# Patient Record
Sex: Female | Born: 1965
Health system: Southern US, Community
[De-identification: ages and names within clinical notes are randomized; demographics above are authoritative.]

## PROBLEM LIST (undated history)

## (undated) DIAGNOSIS — M1990 Unspecified osteoarthritis, unspecified site: Secondary | ICD-10-CM

## (undated) DIAGNOSIS — F32A Depression, unspecified: Secondary | ICD-10-CM

## (undated) DIAGNOSIS — E785 Hyperlipidemia, unspecified: Secondary | ICD-10-CM

## (undated) DIAGNOSIS — D649 Anemia, unspecified: Secondary | ICD-10-CM

## (undated) DIAGNOSIS — I10 Essential (primary) hypertension: Secondary | ICD-10-CM

## (undated) DIAGNOSIS — E119 Type 2 diabetes mellitus without complications: Secondary | ICD-10-CM

## (undated) DIAGNOSIS — F329 Major depressive disorder, single episode, unspecified: Secondary | ICD-10-CM

## (undated) DIAGNOSIS — K219 Gastro-esophageal reflux disease without esophagitis: Secondary | ICD-10-CM

## (undated) HISTORY — PX: TUBAL LIGATION: SHX77

## (undated) HISTORY — DX: Gastro-esophageal reflux disease without esophagitis: K21.9

## (undated) HISTORY — DX: Type 2 diabetes mellitus without complications: E11.9

## (undated) HISTORY — DX: Essential (primary) hypertension: I10

## (undated) HISTORY — DX: Unspecified osteoarthritis, unspecified site: M19.90

## (undated) HISTORY — DX: Depression, unspecified: F32.A

## (undated) HISTORY — DX: Hyperlipidemia, unspecified: E78.5

## (undated) HISTORY — DX: Major depressive disorder, single episode, unspecified: F32.9

## (undated) HISTORY — DX: Anemia, unspecified: D64.9

## (undated) HISTORY — PX: HERNIA REPAIR: SHX51

## (undated) HISTORY — PX: ANKLE SURGERY: SHX546

---

## 2009-10-06 ENCOUNTER — Ambulatory Visit: Payer: Self-pay | Admitting: Family Medicine

## 2009-10-06 ENCOUNTER — Ambulatory Visit (HOSPITAL_COMMUNITY): Admission: RE | Admit: 2009-10-06 | Discharge: 2009-10-06 | Payer: Self-pay | Admitting: Family Medicine

## 2009-10-06 DIAGNOSIS — F329 Major depressive disorder, single episode, unspecified: Secondary | ICD-10-CM

## 2009-10-06 DIAGNOSIS — R5383 Other fatigue: Secondary | ICD-10-CM

## 2009-10-06 DIAGNOSIS — F32A Depression, unspecified: Secondary | ICD-10-CM | POA: Insufficient documentation

## 2009-10-06 DIAGNOSIS — R5381 Other malaise: Secondary | ICD-10-CM | POA: Insufficient documentation

## 2009-10-06 DIAGNOSIS — I10 Essential (primary) hypertension: Secondary | ICD-10-CM | POA: Insufficient documentation

## 2009-10-30 ENCOUNTER — Encounter: Payer: Self-pay | Admitting: Family Medicine

## 2009-10-30 ENCOUNTER — Ambulatory Visit: Payer: Self-pay | Admitting: Family Medicine

## 2009-10-30 LAB — CONVERTED CEMR LAB
ALT: 10 units/L (ref 0–35)
AST: 13 units/L (ref 0–37)
Albumin: 3.7 g/dL (ref 3.5–5.2)
Alkaline Phosphatase: 42 units/L (ref 39–117)
BUN: 14 mg/dL (ref 6–23)
CO2: 25 meq/L (ref 19–32)
Calcium: 9 mg/dL (ref 8.4–10.5)
Chloride: 101 meq/L (ref 96–112)
Cholesterol: 205 mg/dL — ABNORMAL HIGH (ref 0–200)
Creatinine, Ser: 0.83 mg/dL (ref 0.40–1.20)
Glucose, Bld: 240 mg/dL — ABNORMAL HIGH (ref 70–99)
HCT: 35.2 % — ABNORMAL LOW (ref 36.0–46.0)
HDL: 74 mg/dL (ref >39)
Hemoglobin: 10.9 g/dL — ABNORMAL LOW (ref 12.0–15.0)
LDL Cholesterol: 110 mg/dL — ABNORMAL HIGH (ref 0–99)
MCHC: 31 g/dL (ref 30.0–36.0)
MCV: 77.9 fL — ABNORMAL LOW (ref 78.0–100.0)
Platelets: 239 10*3/uL (ref 150–400)
Potassium: 3.8 meq/L (ref 3.5–5.3)
RBC: 4.52 M/uL (ref 3.87–5.11)
RDW: 15.2 % (ref 11.5–15.5)
Sodium: 141 meq/L (ref 135–145)
TSH: 2.684 microintl units/mL (ref 0.350–4.500)
Total Bilirubin: 0.4 mg/dL (ref 0.3–1.2)
Total CHOL/HDL Ratio: 2.8
Total Protein: 7.2 g/dL (ref 6.0–8.3)
Triglycerides: 103 mg/dL (ref <150)
VLDL: 21 mg/dL (ref 0–40)
WBC: 6.8 10*3/uL (ref 4.0–10.5)

## 2009-11-02 ENCOUNTER — Telehealth: Payer: Self-pay | Admitting: Family Medicine

## 2009-11-10 ENCOUNTER — Ambulatory Visit: Payer: Self-pay | Admitting: Family Medicine

## 2009-11-10 DIAGNOSIS — M25569 Pain in unspecified knee: Secondary | ICD-10-CM

## 2009-11-10 DIAGNOSIS — E119 Type 2 diabetes mellitus without complications: Secondary | ICD-10-CM | POA: Insufficient documentation

## 2009-11-10 DIAGNOSIS — G8929 Other chronic pain: Secondary | ICD-10-CM | POA: Insufficient documentation

## 2009-11-10 DIAGNOSIS — D649 Anemia, unspecified: Secondary | ICD-10-CM | POA: Insufficient documentation

## 2009-11-10 LAB — CONVERTED CEMR LAB
Albumin/Creatinine Ratio, Urine, POC: <30
Creatinine,U: 100 mg/dL
Hgb A1c MFr Bld: 9.7 %
Iron: 98 ug/dL (ref 42–145)
Microalbumin U total vol: 10 mg/L
Retic Ct Pct: 0.9 % (ref 0.4–3.1)
Saturation Ratios: 23 % (ref 20–55)
TIBC: 422 ug/dL (ref 250–470)
UIBC: 324 ug/dL

## 2009-11-15 ENCOUNTER — Encounter: Payer: Self-pay | Admitting: Family Medicine

## 2009-12-08 ENCOUNTER — Ambulatory Visit: Payer: Self-pay | Admitting: Family Medicine

## 2009-12-08 LAB — CONVERTED CEMR LAB
Chlamydia, DNA Probe: NEGATIVE
GC Probe Amp, Genital: NEGATIVE
Pap Smear: NEGATIVE

## 2009-12-11 ENCOUNTER — Encounter: Payer: Self-pay | Admitting: Family Medicine

## 2009-12-13 ENCOUNTER — Encounter: Payer: Self-pay | Admitting: Family Medicine

## 2010-01-05 ENCOUNTER — Encounter: Payer: Self-pay | Admitting: Family Medicine

## 2010-01-05 ENCOUNTER — Ambulatory Visit: Payer: Self-pay | Admitting: Family Medicine

## 2010-01-05 DIAGNOSIS — N926 Irregular menstruation, unspecified: Secondary | ICD-10-CM | POA: Insufficient documentation

## 2010-01-05 DIAGNOSIS — K027 Dental root caries: Secondary | ICD-10-CM | POA: Insufficient documentation

## 2010-01-05 LAB — CONVERTED CEMR LAB
HCT: 36.3 % (ref 36.0–46.0)
Hemoglobin: 11 g/dL — ABNORMAL LOW (ref 12.0–15.0)
Hgb A2 Quant: 2.2 % (ref 2.2–3.2)
Hgb A: 97.8 % (ref 96.8–97.8)
Hgb F Quant: 0 % (ref 0.0–2.0)
Hgb S Quant: 0 % (ref 0.0–0.0)
INR: 0.98 (ref <1.50)
MCHC: 30.3 g/dL (ref 30.0–36.0)
MCV: 78.9 fL (ref 78.0–100.0)
Platelets: 212 10*3/uL (ref 150–400)
Prothrombin Time: 13.2 s (ref 11.6–15.2)
RBC: 4.6 M/uL (ref 3.87–5.11)
RDW: 15.4 % (ref 11.5–15.5)
WBC: 7 10*3/uL (ref 4.0–10.5)
aPTT: 28 s (ref 24–37)

## 2010-01-08 ENCOUNTER — Ambulatory Visit (HOSPITAL_COMMUNITY)
Admission: RE | Admit: 2010-01-08 | Discharge: 2010-01-08 | Payer: Self-pay | Source: Home / Self Care | Admitting: Family Medicine

## 2010-01-08 ENCOUNTER — Telehealth: Payer: Self-pay | Admitting: Family Medicine

## 2010-01-08 IMAGING — US US TRANSVAGINAL NON-OB
1 series · 14 of 25 positions shown · non-contrast
Comparison: None

CLINICAL DATA: Irregular bleeding.  Mild anemia.



[Series 1: us transvaginal non-ob · 0.28mm/px · 14 of 49 slices shown]
[im 1/49]
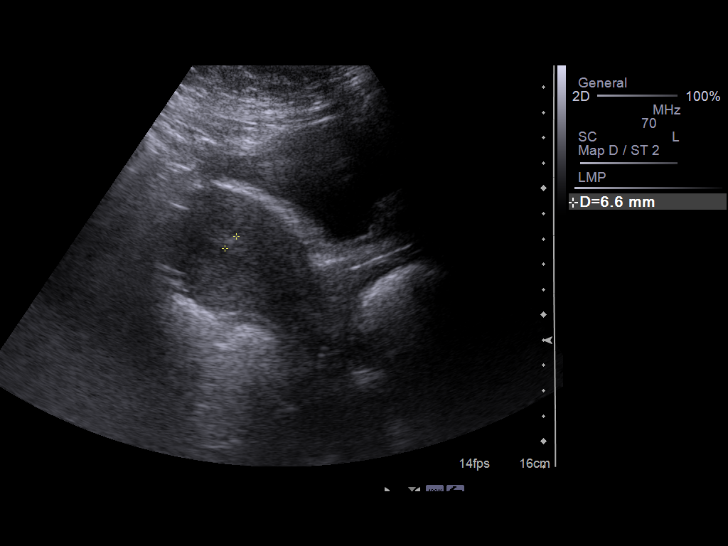
[im 5/49]
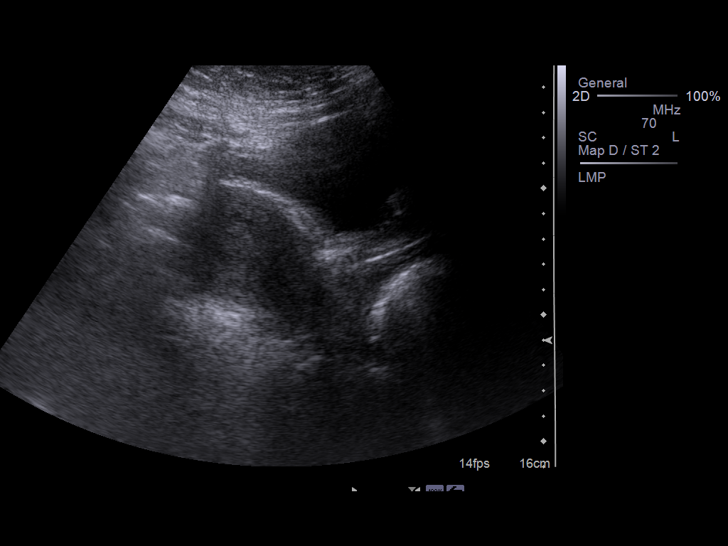
[im 9/49]
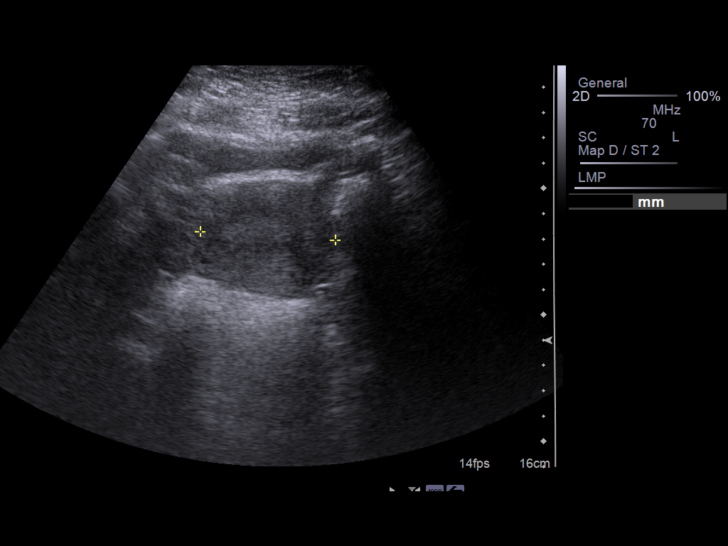
[im 13/49]
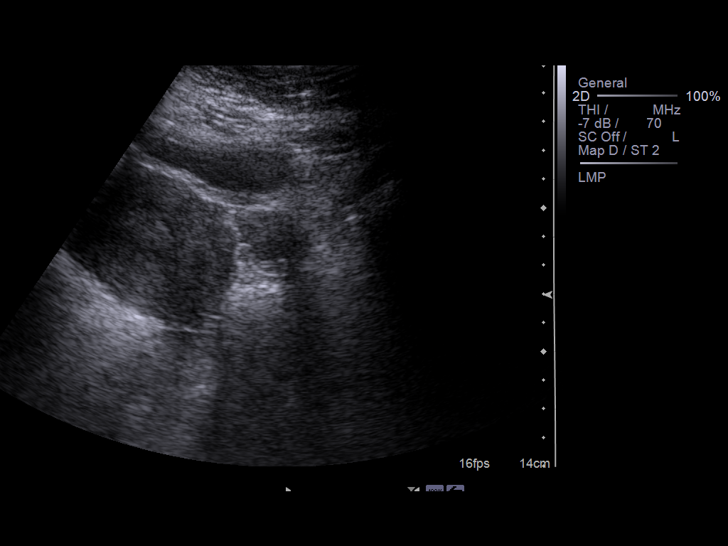
[im 17/49]
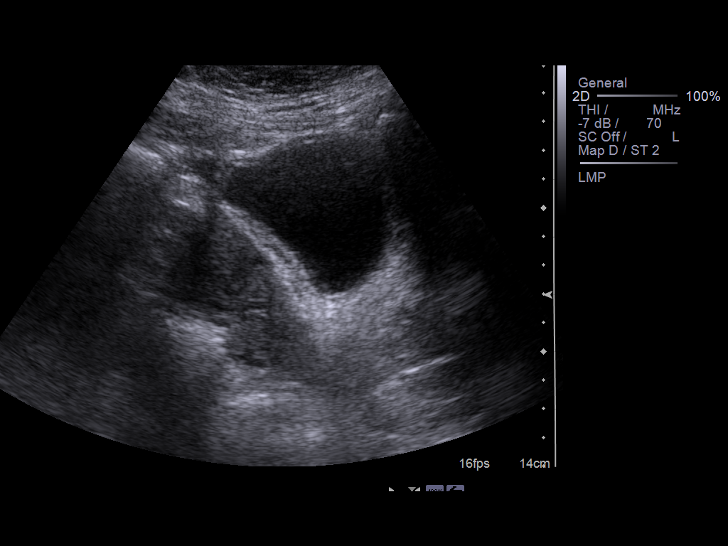
[im 19/49]
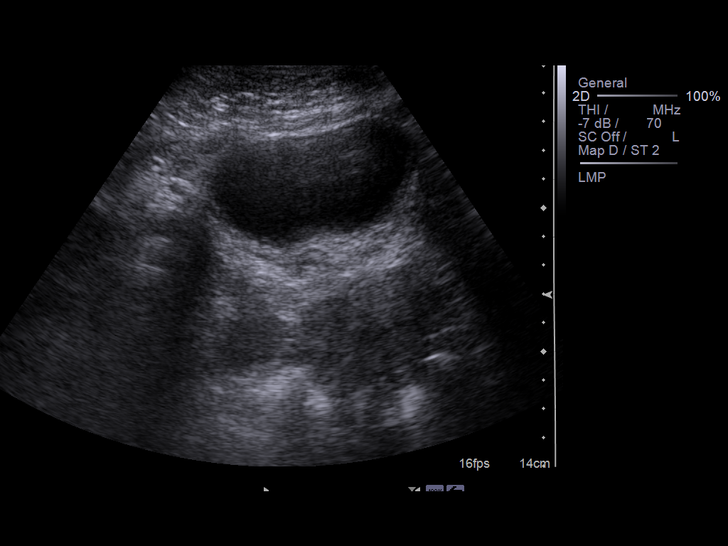
[im 23/49]
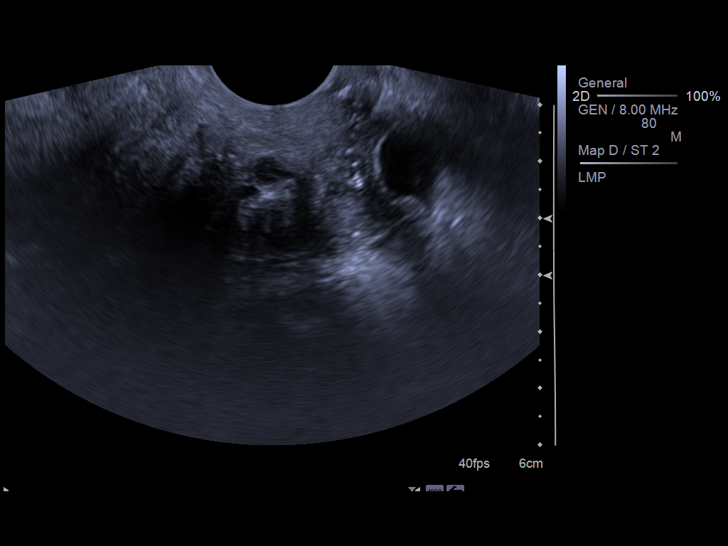
[im 27/49]
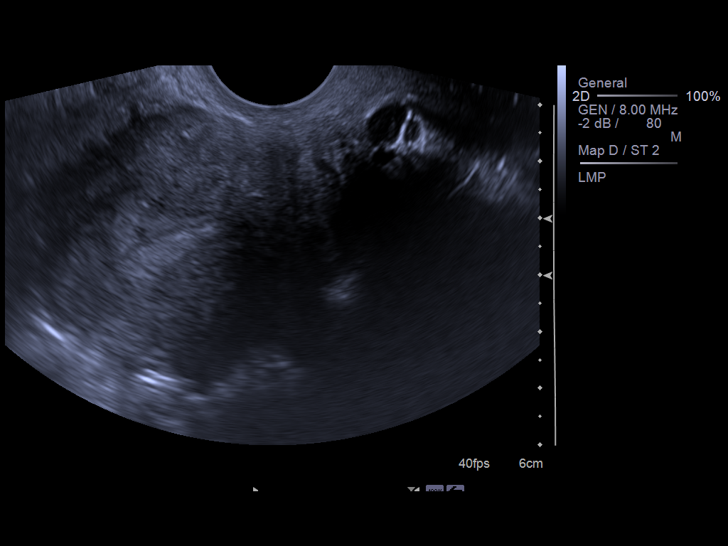
[im 31/49]
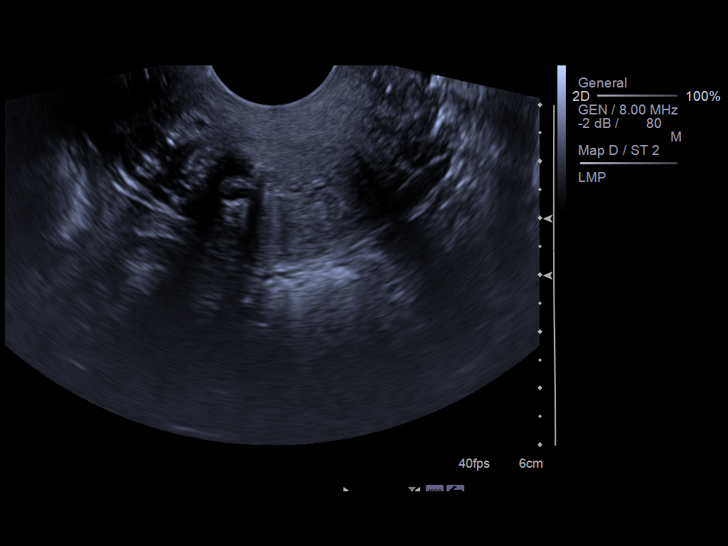
[im 33/49]
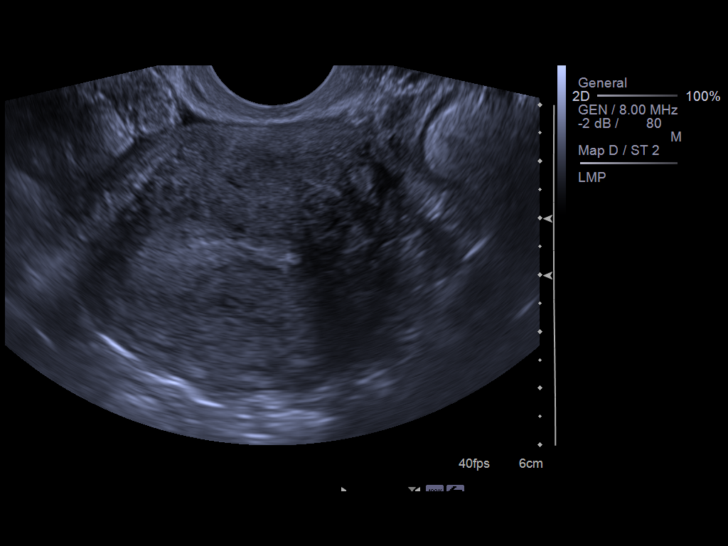
[im 37/49]
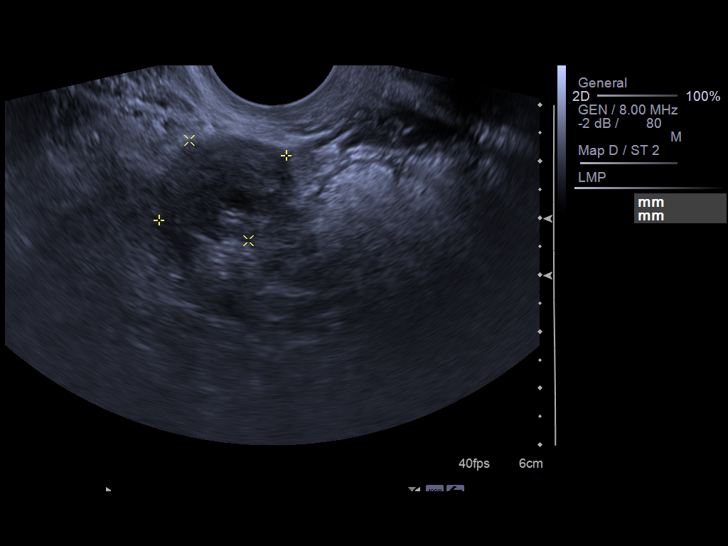
[im 41/49]
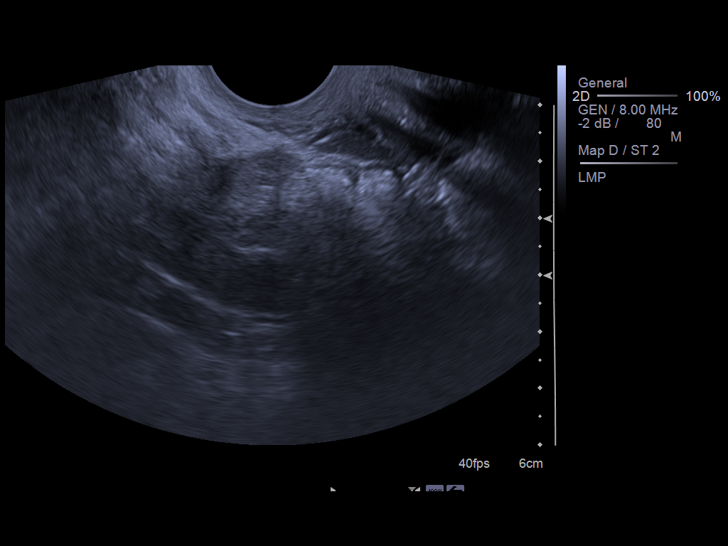
[im 45/49]
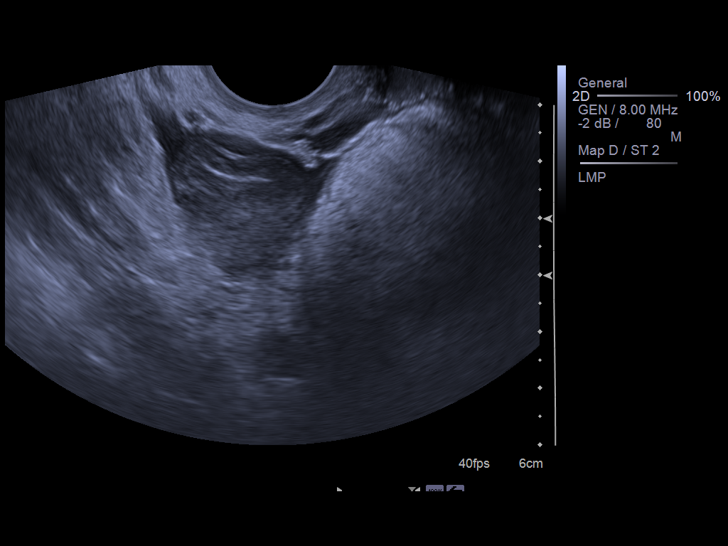
[im 49/49]
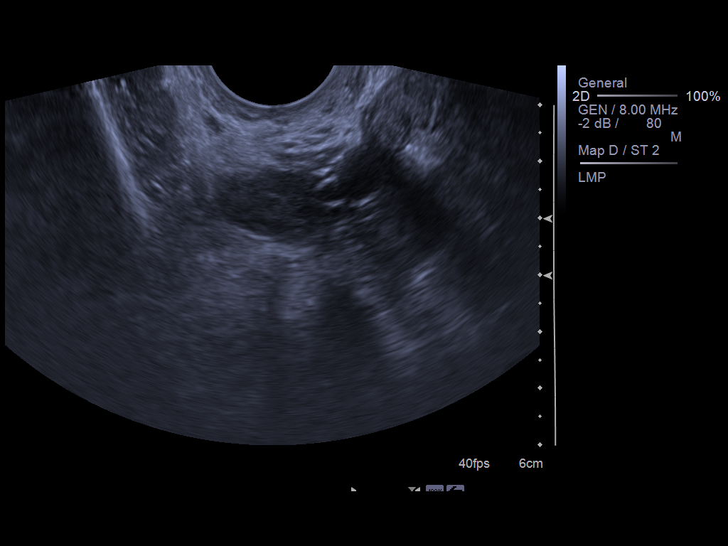

[14 of 25 positions shown; findings below may reference images not displayed]

FINDINGS: Uterus the uterus is 9.5 x 4.5 x 5.4 cm.  No mass identified.

Endometrium the endometrium is 1.3 cm and homogeneous.

Right Ovary the right ovary is 2.6 x 2.2 x 2.7 cm and has a normal
appearance.

Left Ovary the left ovary is 2.5 x 2.1 x 2.7 cm and has a normal
appearance.

Other Findings:  No free pelvic fluid.
IMPRESSION: Normal pelvic ultrasound.

## 2010-01-11 ENCOUNTER — Encounter: Payer: Self-pay | Admitting: Family Medicine

## 2010-01-19 ENCOUNTER — Ambulatory Visit: Payer: Self-pay | Admitting: Family Medicine

## 2010-01-19 LAB — CONVERTED CEMR LAB: Beta hcg, urine, semiquantitative: NEGATIVE

## 2010-01-22 ENCOUNTER — Telehealth: Payer: Self-pay | Admitting: Family Medicine

## 2010-02-25 ENCOUNTER — Encounter: Payer: Self-pay | Admitting: Family Medicine

## 2010-03-06 NOTE — Assessment & Plan Note (Signed)
Summary: discuss bleeding/eo   Vital Signs:  Patient profile:   45 year old female Height:      64 inches Weight:      209.31 pounds BMI:     36.06 BSA:     2.00 Temp:     98.5 degrees F Pulse rate:   100 / minute BP sitting:   124 / 90  Vitals Entered By: Jone Baseman CMA (January 05, 2010 10:31 AM) CC: bleeding after intercourse Is Patient Diabetic? No Pain Assessment Patient in pain? no        CC:  bleeding after intercourse.  History of Present Illness: Seen today to discuss two items:   1) has had concerns about dyspareunia and bleeding with intercourse.  has to use a pad on the bed to prevent bleeding on sheets.  Has had some variation in the frequency of her menstrual cycle as well. Usually around 28 days.  LMP 11/13; prior to that was 33 days to her penultimate menses.  Last 3 to 4 days, usually heaviest first day then lighten up.    Not on hormones (had BTL-- would like to have it reversed, discussed that we consider BTL to be irreversible).   2) Dental problems; not particularly painful, but has had breakage of her molars.  L lower and R upper molars.  requests dental referral.   Habits & Providers  Alcohol-Tobacco-Diet     Tobacco Status: quit  Current Medications (verified): 1)  Dyazide 37.5-25 Mg Caps (Triamterene-Hctz) .Marland Kitchen.. 1 By Mouth Daily 2)  Lisinopril 10 Mg Tabs (Lisinopril) .Marland Kitchen.. 1 By Mouth Once Daily  Allergies (verified): No Known Drug Allergies  Physical Exam  General:  well appearing, no apparent distress   Impression & Recommendations:  Problem # 1:  IRREGULAR MENSTRUAL CYCLE (ICD-626.4) Discussed results of her recent PAP (normal), history of LEEP procedure.  Discussed EMB as next step in workup of irreg bleeding.  Also to suggest transvaginal US.  To schedule for EMB here; she is to take ibuprofen 600-800mg  before coming to office. Pregnancy test upon arrival for next visit (ordered as future order). Orders: PTT-[facility]  ([id number]) INR/PT-[facility] (09811) Ultrasound (Ultrasound) [facility]- Est  Level 4 (99214)Future Orders: U Preg-[facility] (91478) ... 01/24/2011  Problem # 2:  DENTAL CARIES OF ROOT SURFACE (ICD-521.08) Order done for dentist.  Orders: Dental Referral (Dentist) [facility]- Est  Level 4 (29562)  Problem # 3:  ANEMIA, IRON DEFICIENCY, MICROCYTIC (ICD-280.8) Anemia, for consideration of inherited hemoglobinopathy.  For Hgb EF.  Orders: CBC-[facility] ([address]86) Miscellaneous Lab Charge-[facility] [id number]) PTT-[facility] ([id number]) INR/PT-[facility] (24401) [facility]- Est  Level 4 (02725)  Complete Medication List: 1)  Dyazide 37.5-25 Mg Caps (Triamterene-hctz) .Marland Kitchen.. 1 by mouth daily 2)  Lisinopril 10 Mg Tabs (Lisinopril) .Marland Kitchen.. 1 by mouth once daily  Patient Instructions: 1)  It was a pleasure to see you today. I am ordering blood tests to look for inherited forms of anemia today.  2)  I would like you to come in for an endometrial biopsy at your convenience, later this month.  I would ask you to take over-the-counter ibuprofen, 4 tablets, just before coming to the office to minimize cramping after the procedure. 3)  I am ordering an ultrasound of your pelvis to check for other causes of the irregular bleeding   Orders Added: 1)  CBC-[facility] [85027] 2)  Miscellaneous Lab Charge-[facility] [99999] 3)  U Preg-[facility] [81025] 4)  PTT-[facility] [[id number]] 5)  INR/PT-[facility] [85610] 6)  Ultrasound [Ultrasound] 7)  Dental Referral [  Dentist] 8)  [facility]- Est  Level 4 [09811]

## 2010-03-06 NOTE — Assessment & Plan Note (Signed)
Summary: np,df   History of Present Illness: New patient to [facility]; comes in to establish care and discuss concern re. dry cough that she has had off and on for about 1 yr.  Attributes it to amlodipine which was started for her BP control by her prior PCP, Dr Malena Peer in [facility].  She is transferring care to [facility], closer to her home.  Had been on HCTZ/ triam without adequate control; had amlodipine 5mg  added, with good control of BP but ensuing cough.  Has not been on other antihypertensives that she knows of. NKDA.  Among other historical points discussed, she reports long history of depression.  Passive death wish but no active plans or intent to harm herself. Thinks of her sons as motivation not to harm herself.  Reports trouble withsleep.  Often will initiate sleep at 3am, and sleep only until 530am.  Feels tired all day.  No burst of energy or nervousness.  Could easily fall asleep in car where she was passenger.  Low energy.  Avoids contact with people: "People annoy me."    Current Medications (verified): 1)  None  Allergies (verified): No Known Drug Allergies  Past History:  Past Medical History: HTN; prior hx anemia (resolved per patient); believes she has depression.  Past Surgical History: s/p BTL Tubal ligation  Family History: Mother living with stroke, heart disease (2007), depression.  Father with adult-onset diabetes.  Suspects family members (cousins) of having depression, although no one diagnosed with this that she is aware of. No known diagnosis of bipolar disease.   Social History: Quit smoking (1995); never used recreational drugs or alcohol.  Divorced. Lives with three sons.  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   Physical Exam  General:  well appearing, no apparent distress.  Eyes:  clear sclerae.  PERRL Mouth:  moist mucus membranes.  Neck:  neck supple. no cervical adenopathy Lungs:  Normal respiratory effort, chest expands  symmetrically. Lungs are clear to auscultation, no crackles or wheezes. Heart:  Normal rate and regular rhythm. S1 and S2 normal without gallop, murmur, click, rub or other extra sounds. Abdomen:  soft, nontender nondistended.  Pulses:  palpable dp pulses bilaterally Extremities:  1+ symmetric ankle edema without skin changes or erythema.    Impression & Recommendations:  Problem # 1:  ESSENTIAL HYPERTENSION, BENIGN (ICD-401.1)  Patient reports cough that she associates with her amlodipine.  Will hold norvasc, change to metoprolol 25mg  twice daily. ECG done today. Fasting labs to include chol panel, renal fxn, CBC and glucose. Followup in 50month with me.   Her updated medication list for this problem includes:    Dyazide 37.5-25 Mg Caps (Triamterene-hctz) .Marland Kitchen... 1 by mouth daily    Metoprolol Tartrate 25 Mg Tabs (Metoprolol tartrate) .Marland Kitchen... 1 by mouth two times a day  Problem # 2:  DEPRESSIVE DISORDER NOT ELSEWHERE CLASSIFIED (ICD-311) PHQ9 score is 21.  Question #9 is a 3, is quick to say she would not hurt herself because of her sons' reaction.  Never treated for depression before. Sleeps very little, falls asleep in car when a passenger.  I suspect sleep disorder not related to bipolar d/o.  Will evaluate further at next visit, including Epworth Sleepiness Scale.   Complete Medication List: 1)  Dyazide 37.5-25 Mg Caps (Triamterene-hctz) .Marland Kitchen.. 1 by mouth daily 2)  Metoprolol Tartrate 25 Mg Tabs (Metoprolol tartrate) .Marland Kitchen.. 1 by mouth two times a day  Other Orders: EKG- [facility] (EKG) Future Orders: Lipid-[facility] ([id number]) .Marland KitchenMarland Kitchen  10/11/2010 Comp Met-[facility] ([id number]) ... 10/12/2010 CBC-[facility] (09811) ... 10/19/2010 TSH-[facility] [phone]) ... 10/19/2010  Patient Instructions: 1)  It was a pleasure to meet you today.  2)  I am changing your  blood pressure meds.  Stop taking the amlopidine.  I am prescribing a new medicine, metoprolol 25mg  take 1 tablet twice daily.  3)  Please come in fasting (8  hours without anything but water to eat or drink) for labs (cholesterol, kidney function, blood count and sugar).  Also, blood pressure check then as well.  4)  I would like to see you in 1 month. (Followup 1 month with Dr Mauricio Po) Prescriptions: METOPROLOL TARTRATE 25 MG TABS (METOPROLOL TARTRATE) 1 by mouth two times a day  #60 x 6   Entered and Authorized by:   Paula Compton MD   Signed by:   Paula Compton MD on 10/06/2009   Method used:   Electronically to        [facility] [phone]* (retail)       [address]       [place], [place]  [address]       Ph: [phone]       Fax: [phone]   RxID:   [id number] DYAZIDE 37.5-25 MG CAPS (TRIAMTERENE-HCTZ) 1 by mouth daily  #30 x 6   Entered and Authorized by:   Paula Compton MD   Signed by:   Paula Compton MD on 10/06/2009   Method used:   Electronically to        [facility] [phone]* (retail)       [address]       [place], [place]  [address]       Ph: [phone]       Fax: [phone]   RxID:   [id number]

## 2010-03-06 NOTE — Progress Notes (Signed)
  Phone Note Outgoing Call   Call placed by: Paula Compton MD,  November 02, 2009 8:49 AM Call placed to: Patient Summary of Call: Spoke with patient at [phone] to report lab values, discuss elevated glucose (240, fasting).  She reports that she has never had elevated glucose that she knows of; never diagnosed with DM.  Also with slight microcytic anemia, will discuss possible causes (menses, GI blood loss, hemoglobinopathies, family history) at visit scheduled for Fri Oct 7th.  Will plan on further workup with Fe, TIBC, ferritin, peripheral smear, and screening for occult colon cancer.  She plans to attend next visit. Initial call taken by: Paula Compton MD,  November 02, 2009 8:55 AM

## 2010-03-06 NOTE — Letter (Signed)
Summary: Generic Letter  [facility]  [address]   [place], [place] [address]   Phone: [phone]  Fax: [phone]    11/15/2009  EVAROSE ALTLAND [address] [place], [place]  [address]  Dear Ms. Juran,   It was a pleasure to see you last week in the office.  I got the results of your bloodwork back, and your iron levels are okay.  We can talk about the way we can assess the mild anemia when you come in for your Pap smear.      Sincerely,   Paula Compton MD  Appended Document: Generic Letter mailed.

## 2010-03-06 NOTE — Letter (Signed)
Summary: Generic Letter  [facility]  [address]   [place], [place] [address]   Phone: [phone]  Fax: [phone]    12/13/2009  Colleen Ford [address] [place], [place]  [address]  Dear [name],   I write with good news regarding your Pap smear.  The result came back negative (normal).         Sincerely,   Paula Compton MD  Appended Document: Generic Letter mailed

## 2010-03-06 NOTE — Letter (Signed)
Summary: Generic Letter  [facility]  [address]   [place], [place] [address]   Phone: [phone]  Fax: [phone]    12/11/2009  Colleen Ford [address] [place], [place]  [address]  Dear [name],   I hope this letter finds you well.  I write to let you know that the cervical cultures done during your most recent visit are negative.  I will be in contact once I get the results of the Pap smear.         Sincerely,   Paula Compton MD

## 2010-03-06 NOTE — Assessment & Plan Note (Signed)
Summary: F/U/KH   Vital Signs:  Patient profile:   45 year old female Height:      64 inches Weight:      207 pounds BMI:     35.66 Temp:     98.1 degrees F oral Pulse rate:   67 / minute BP sitting:   110 / 69  (left arm) Cuff size:   regular  Vitals Entered By: Tessie Fass CMA (November 10, 2009 1:35 PM) CC: F/U labs Is Patient Diabetic? No Pain Assessment Patient in pain? no        CC:  F/U labs.  Habits & Providers  Alcohol-Tobacco-Diet     Tobacco Status: quit  Allergies: No Known Drug Allergies  Social History: Smoking Status:  quit   Impression & Recommendations:  Problem # 1:  DIABETES MELLITUS (ICD-250.00) Newly diagnosed, with fasting glucose 240.  Some dry mouth on exam today, but denies polyuria or polydipsia.  For A1c, discussed minor changes in diet and exercise.  She has pica for [place] starch (corn starch), mostly during menses.  Will address the likely Fe deficiency, which in turn may lessen her craving for corn starch and improve her DM control.  Her updated medication list for this problem includes:    Lisinopril 10 Mg Tabs (Lisinopril) .Marland Kitchen... 1 by mouth once daily  Orders: A1C-[facility] (04540) UA Microalbumin-[facility] (82044) [facility]- Est  Level 4 (98119)  Problem # 2:  ANEMIA, IRON DEFICIENCY, MICROCYTIC (ICD-280.8) Microcytic anemia; has three-day cycles.  Some intermittent vaginal bleeding. Will need PAP smear in the coming month, and eval for bleeding with intercourse (painless). Pica with menses.  Likely to need Fe supplementation.  Orders: Iron -[facility] [id number]) Iron Binding Cap (TIBC)-[facility] ([id number]) Retic-[facility] ([id number]) [facility]- Est  Level 4 (24401)  Problem # 3:  KNEE PAIN, RIGHT (ICD-719.46) Medial joint space pain in R knee, no effusion noted.  Will send for standing xrays.  Acetaminophen analgesia. Orders: Diagnostic X-Ray/Fluoroscopy (Diagnostic X-Ray/Flu) [facility]- Est  Level 4 (02725)  Problem # 4:  ESSENTIAL HYPERTENSION, BENIGN  (ICD-401.1) Pressure well controlled on metoprolol.  Given new dx of DM, and her concern re. finances, will have her finish current supply of metoprolol and start lisinopril (stop metoprolol) in 3 weeks when metoprolol runs out. Continue dyazide along with lisinopril. Recheck BP at next visit.   Problem # 5:  DEPRESSIVE DISORDER NOT ELSEWHERE CLASSIFIED (ICD-311)  Continues to feel about the same as last month. No changes (is clear that this is no worse than before).  Orders: [facility]- Est  Level 4 ([address]644)  Complete Medication List: 1)  Dyazide 37.5-25 Mg Caps (Triamterene-hctz) .Marland Kitchen.. 1 by mouth daily 2)  Lisinopril 10 Mg Tabs (Lisinopril) .Marland Kitchen.. 1 by mouth once daily  Patient Instructions: 1)  It was a pleasure to see you today.  2)  I am sending you for an xray of your knees (standing). 3)  Keep taking your metoprolol twice daily UNTIL IT RUNS OUT.  When it runs out, SWITCH TO THE LISINOPRIL 10mg  ONE TIME DAILY FOR BLOOD PRESSURE AND STOP THE METOPROLOL. 4)  Please make an appointment to do your PAP smear and to examine for causes of the bleeding.  Keep a log of your bleeding.  5)  I am ordering labs today for iron deficiency anemia, as well as to assess the diabetes.  I will be in touch with results.  Prescriptions: DYAZIDE 37.5-25 MG CAPS (TRIAMTERENE-HCTZ) 1 by mouth daily  #30 x 6   Entered and  Authorized by:   Paula Compton MD   Signed by:   Paula Compton MD on 11/10/2009   Method used:   Electronically to        [facility] [phone]* (retail)       [address]       [place], [place]  [address]       Ph: [phone]       Fax: [phone]   RxID:   [id number] LISINOPRIL 10 MG TABS (LISINOPRIL) 1 by mouth once daily  #30 x 6   Entered and Authorized by:   Paula Compton MD   Signed by:   Paula Compton MD on 11/10/2009   Method used:   Electronically to        [facility] [phone]* (retail)       [address]       [place], [place]  [address]       Ph: [phone]        Fax: [phone]   RxID:   [id number]   Laboratory Results   Urine Tests  Date/Time Received: November 10, 2009 2:17 PM  Date/Time Reported: November 10, 2009 3:20 PM   Microalbumin (urine): 10 mg/L Creatinine: 100mg /dL  A:C Ratio <02 Normal Comments: ...............test performed by......Marland KitchenBonnie A. [place], MLS (ASCP)cm   Blood Tests   Date/Time Received: November 10, 2009 2:17 PM  Date/Time Reported: November 10, 2009 3:20 PM   HGBA1C: 9.7%   (Normal Range: Non-Diabetic - 3-6%   Control Diabetic - 6-8%)  Comments: ...............test performed by......Marland KitchenBonnie A. [place], MLS (ASCP)cm

## 2010-03-06 NOTE — Progress Notes (Signed)
  Phone Note Outgoing Call Call back at [facility] Phone [phone]   Call placed by: Paula Compton MD,  January 08, 2010 1:43 PM Call placed to: Patient Summary of Call: Called patient to report [place] results, which were normal.  Left voice message.  Initial call taken by: Paula Compton MD,  January 08, 2010 1:44 PM

## 2010-03-06 NOTE — Letter (Signed)
Summary: Generic Letter  [facility]  [address]   [place], [place] [address]   Phone: [phone]  Fax: [phone]    01/11/2010  AERILYNN GOIN [address] [place], [place]  [address]  Dear Ms. Signer,    I write with the results of your recent lab tests.  The mild anemia is unchanged; the test for inherited forms of anemia is negative.   I look forward to discussing this with you at your next visit.       Sincerely,   Paula Compton MD  Appended Document: Generic Letter mailed

## 2010-03-06 NOTE — Assessment & Plan Note (Signed)
Summary: CPE/KH (resch'd fr 11/2)bmc   Vital Signs:  Patient profile:   45 year old female Height:      64 inches Weight:      208 pounds BMI:     35.83 Temp:     98.0 degrees F oral Pulse rate:   99 / minute BP sitting:   137 / 85  (left arm) Cuff size:   large  Vitals Entered By: Tessie Fass CMA (December 08, 2009 2:58 PM) CC: CPP Is Patient Diabetic? No Pain Assessment Patient in pain? no        CC:  CPP.  History of Present Illness: Colleen Ford is here today for well woman exam.  Last WWE was with Dr. Okey Dupre, had abnormal PAP and then a colpo with LEEP procedure done Feb/March 2010.  Also diagnosed with STI (GC and Chlamydia), treated in March 2011.  Says she had HIV and RPR testing at that time, negative. Is not interested in this testing (HIV/RPR) at this time.   LMP Oct 8-11, 2011. Starts light, has heavy flow on last day. Penultimate menses on Sept 4-11, again, not very heavy.  Had BTL 12 yrs ago. Is interested in reversal, is sexually active and interested in having children.  Denies dysuria, vaginal discharge or bleeding between her menses. Says that after intercourse, a small amt of bleeding (emphasizes it is related to intercourse).  No spermicides or lubricants. Uses condoms.   States that her depression/mood is unchanged from last visit.   In discussing her anemia, (microcytic, with RDW that is not wide), no family members with inherited anemias. Has a cousin with sickle cell trait.  Deryn herself has never been tested.   Habits & Providers  Alcohol-Tobacco-Diet     Tobacco Status: quit  Current Medications (verified): 1)  Dyazide 37.5-25 Mg Caps (Triamterene-Hctz) .Marland Kitchen.. 1 By Mouth Daily 2)  Lisinopril 10 Mg Tabs (Lisinopril) .Marland Kitchen.. 1 By Mouth Once Daily  Allergies (verified): No Known Drug Allergies  Physical Exam  General:  well appearing, no apparent distress.  Abdomen:  soft, nontender.  Rectal:  intact Genitalia:  normal vaginal mucosa.  No  evidence of trauma or friability.  Cervix clean and smooth, without lesions.  After performing PAP and endocervical brush, blood with clots from os. No masses in adnexae with bimanual exam.  Denies pain.    Impression & Recommendations:  Problem # 1:  SCREENING FOR MALIGNANT NEOPLASM OF THE CERVIX (ICD-V76.2) PAP smear done today.  History of LEEP March 2010.  Will attempt to get records.   Orders: Pap Smear-[facility] ([id number]) [facility]- Est  Level 4 (09811)  Problem # 2:  SEXUALLY TRANSMITTED DISEASE, EXPOSURE TO (ICD-V01.6) Prior STI earlier this year.  For culture today.  Asymptomatic.  Orders: GC/Chlamydia-[facility] (87591/87491) [facility]- Est  Level 4 (91478)  Problem # 3:  ANEMIA, IRON DEFICIENCY, MICROCYTIC (ICD-280.8)  MIcrocytic anemia, iron studies do not suggest iron deficiency.  Will not draw blood today, but would like to perform Hgb EF to look for hemoglobinopathy.  Orders: [facility]- Est  Level 4 ([address])  Problem # 4:  DEPRESSIVE DISORDER NOT ELSEWHERE CLASSIFIED (ICD-311)  Stable. No changes.   Orders: [facility]- Est  Level 4 ([address])  Problem # 5:  Screening Breast Cancer (ICD-V76.10) Will review history of breast cancer screening and order mammogram as indicated.   Problem # 6:  ESSENTIAL HYPERTENSION, BENIGN (ICD-401.1) Stable, at goal.  Continue meds as she is taking them. Her updated medication list for this problem includes:  Dyazide 37.5-25 Mg Caps (Triamterene-hctz) .Marland Kitchen... 1 by mouth daily    Lisinopril 10 Mg Tabs (Lisinopril) .Marland Kitchen... 1 by mouth once daily  Orders: [facility]- Est  Level 4 ([address])  Complete Medication List: 1)  Dyazide 37.5-25 Mg Caps (Triamterene-hctz) .Marland Kitchen.. 1 by mouth daily 2)  Lisinopril 10 Mg Tabs (Lisinopril) .Marland Kitchen.. 1 by mouth once daily  Other Orders: Mammogram (Screening) (Mammo)  Patient Instructions: 1)  It was a pleasure to see you today.  Your cervix looks well today, but I will call you with the results of your PAP smear and cervical culture.  2)  I  recommend that we do some more blood work for your anemia ("hemoglobin electrophoresis") to look for inherited anemias, once you have the [employer]).   Orders Added: 1)  Pap Smear-[facility] [[id number]] 2)  GC/Chlamydia-[facility] [87591/87491] 3)  [facility]- Est  Level 4 [91478] 4)  Mammogram (Screening) [Mammo]     Prevention & Chronic Care Immunizations   Influenza vaccine: Not documented    Tetanus booster: Not documented    Pneumococcal vaccine: Not documented  Other Screening   Pap smear: Not documented    Mammogram: Not documented   Mammogram action/deferral: Ordered  (12/08/2009)   Smoking status: quit  (12/08/2009)  Diabetes Mellitus   HgbA1C: 9.7  (11/10/2009)    Eye exam: Not documented    Foot exam: Not documented   High risk foot: Not documented   Foot care education: Not documented    Urine microalbumin/creatinine ratio: Not documented  Lipids   Total Cholesterol: 205  (10/30/2009)   LDL: 110  (10/30/2009)   LDL Direct: Not documented   HDL: 74  (10/30/2009)   Triglycerides: 103  (10/30/2009)  Hypertension   Last Blood Pressure: 137 / 85  (12/08/2009)   Serum creatinine: 0.83  (10/30/2009)   Serum potassium 3.8  (10/30/2009)  Self-Management Support :    Diabetes self-management support: Not documented    Hypertension self-management support: Not documented   Nursing Instructions: Schedule screening mammogram (see order)

## 2010-03-08 NOTE — Progress Notes (Signed)
  Phone Note Outgoing Call Call back at [facility] Phone [phone]   Call placed by: Paula Compton MD,  January 22, 2010 2:16 PM Call placed to: Patient    Called patient back, spoke with her about the EMB path report which does not show evidence of endometrial cancer.  She will log her vaginal bleeding through her Feb cycle and make followup appt at that time.  Paula Compton MD  January 22, 2010 2:24 PM

## 2010-03-08 NOTE — Miscellaneous (Signed)
Summary: Procedures Consent  Procedures Consent   Imported By: De Nurse 01/30/2010 12:06:56  _____________________________________________________________________  External Attachment:    Type:   Image     Comment:   External Document

## 2010-03-08 NOTE — Assessment & Plan Note (Signed)
Summary: F/U  KH   Vital Signs:  Patient profile:   45 year old female Weight:      202 pounds Pulse rate:   80 / minute BP sitting:   122 / 82  (right arm)  Vitals Entered By: Arlyss Repress CMA, (January 19, 2010 2:45 PM) CC: f/up per dr.breen Is Patient Diabetic? No Pain Assessment Patient in pain? no        CC:  f/up per dr.breen.  History of Present Illness: Colleen Ford returns today to have EMB as part of workup for irregular vaginal bleeding.  She reports that her last menstrual cycle took place from Dec 9 to 89.  She continues to have some bleeding at irregular times, and also with intercourse.   Her past surgical history includes BTL.   We discussed the results of her recent transvaginal US, which did not show fibroids.    No family history of cervical or endometrial cancer.   Current Medications (verified): 1)  Dyazide 37.5-25 Mg Caps (Triamterene-Hctz) .Marland Kitchen.. 1 By Mouth Daily 2)  Lisinopril 10 Mg Tabs (Lisinopril) .Marland Kitchen.. 1 By Mouth Once Daily  Allergies (verified): No Known Drug Allergies  Family History: Mother living with stroke, heart disease (2007), depression.  Father with adult-onset diabetes.  Suspects family members (cousins) of having depression, although no one diagnosed with this that she is aware of. No known diagnosis of bipolar disease.   Jan 19, 2010: No family hx of endometrial or cervical cancer.   Physical Exam  General:  well appearing, no apparent distress Genitalia:  bimanual exam: normal nongravid uterine size; no adnexal masses felt.  Cervix not anteverted nor retroverted.  Upon speculum inspection, transformation zone noted. Clean cervix. no bleeding nor discharge Additional Exam:  Procedure note: Discussed risks and benefits with patient, including bleeding, infection and injury/perforation.  Patient given opportunity to ask questions.  Bimanual exam performed.  Results of urine pregnancy test noted (negative).  Speculum inserted, betadine  swabs used to cleanse cervix.  No cervical discharge or bleeding noted. Sterile gloves donned.  Uterine sound inserted to 7.5cm.  Pipelle inserted until resistance felt (7.5cm).  plunger pulled and pipelle rotated.  Ample endometrial tissue retrieved.  Pipelle removed.  Scant bleeding noted from the os.  Patient tolerated the procedure well.  Given aftercare instructions.     Impression & Recommendations:  Problem # 1:  IRREGULAR MENSTRUAL CYCLE (ICD-626.4) EMB performed today, ample tissue collected and submitted.  Will call pt with results to cell ([phone]) or 2nd cell ([phone]).  Tolerated procedure well.  Aftercare instructions discussed, as well as red flags for which I want her to call.  Orders: U Preg-[facility] (81025) [facility]- Est Level  3 (99213) Endometrial/Endocerv BX- [facility] (58100)  Complete Medication List: 1)  Dyazide 37.5-25 Mg Caps (Triamterene-hctz) .Marland Kitchen.. 1 by mouth daily 2)  Lisinopril 10 Mg Tabs (Lisinopril) .Marland Kitchen.. 1 by mouth once daily   Orders Added: 1)  U Preg-[facility] [81025] 2)  [facility]- Est Level  3 [99213] 3)  Endometrial/Endocerv BX- [facility] [58100]    Laboratory Results   Urine Tests  Date/Time Received: January 19, 2010 2:51 PM  Date/Time Reported: January 19, 2010 3:05 PM     Urine HCG: negative Comments: ...........test performed by...........Marland KitchenTerese Door, CMA

## 2010-03-28 ENCOUNTER — Encounter: Payer: Self-pay | Admitting: *Deleted

## 2010-04-03 ENCOUNTER — Ambulatory Visit: Payer: Self-pay | Admitting: Family Medicine

## 2010-04-04 ENCOUNTER — Ambulatory Visit (INDEPENDENT_AMBULATORY_CARE_PROVIDER_SITE_OTHER): Payer: Self-pay | Admitting: Family Medicine

## 2010-04-04 ENCOUNTER — Encounter: Payer: Self-pay | Admitting: Family Medicine

## 2010-04-04 VITALS — BP 103/69 | HR 96 | Temp 98.2°F | Ht 64.0 in | Wt 200.0 lb

## 2010-04-04 DIAGNOSIS — I10 Essential (primary) hypertension: Secondary | ICD-10-CM

## 2010-04-04 DIAGNOSIS — N926 Irregular menstruation, unspecified: Secondary | ICD-10-CM

## 2010-04-04 DIAGNOSIS — E119 Type 2 diabetes mellitus without complications: Secondary | ICD-10-CM

## 2010-04-04 LAB — BASIC METABOLIC PANEL
BUN: 14 mg/dL (ref 6–23)
CO2: 26 mEq/L (ref 19–32)
Calcium: 9.5 mg/dL (ref 8.4–10.5)
Chloride: 99 mEq/L (ref 96–112)
Creat: 0.87 mg/dL (ref 0.40–1.20)
Glucose, Bld: 231 mg/dL — ABNORMAL HIGH (ref 70–99)
Potassium: 4.2 mEq/L (ref 3.5–5.3)
Sodium: 137 mEq/L (ref 135–145)

## 2010-04-04 LAB — CONVERTED CEMR LAB
BUN: 14 mg/dL (ref 6–23)
CO2: 26 meq/L (ref 19–32)
Calcium: 9.5 mg/dL (ref 8.4–10.5)
Chloride: 99 meq/L (ref 96–112)
Creatinine, Ser: 0.87 mg/dL (ref 0.40–1.20)
Direct LDL: 117 mg/dL — ABNORMAL HIGH
Glucose, Bld: 231 mg/dL — ABNORMAL HIGH (ref 70–99)
Potassium: 4.2 meq/L (ref 3.5–5.3)
Sodium: 137 meq/L (ref 135–145)

## 2010-04-04 LAB — POCT GLYCOSYLATED HEMOGLOBIN (HGB A1C): Hemoglobin A1C: 9.2

## 2010-04-04 MED ORDER — ASPIRIN 81 MG PO CHEW: 81.0000 mg | CHEWABLE_TABLET | Freq: Every day | ORAL | Status: AC

## 2010-04-04 MED ORDER — METFORMIN HCL 500 MG PO TABS: 500.0000 mg | ORAL_TABLET | Freq: Two times a day (BID) | ORAL | Status: DC

## 2010-04-04 NOTE — Assessment & Plan Note (Signed)
Continued bleeding with intercourse. No visible source of vaginal bleeding on inspection at last visit (Dec 2011), negative EMB.  For referral to Gyn to consider ablation/D&C.  Patient amenable to referral for thisproblem.

## 2010-04-04 NOTE — Patient Instructions (Signed)
It was a pleasure to see you today!  Congratulations on losing weight!  For the vaginal bleeding, I am referring you to the Gynecology service at [facility].   We will call you with more information.  For your Diabetes, I am checking labs today and I will be in touch with you about the results.  I sent a prescription for the Metformin 500mg  tablets to [facility] on [address] and [address].  Please take one tablet each morning for the first week, then increase to one tablet twice daily.  Call me if you have trouble tolerating the medicine.  For your blood pressure, it is well controlled today.  I am holding your Dyazide, but ask that you keep taking the Lisinopril one time daily.  Also, please take a baby aspirin 81mg  one time daily.  I would to see you back in the next 1 to 2 months for follow up.

## 2010-04-04 NOTE — Assessment & Plan Note (Signed)
Patient not previously on DM medication.  Discussed lifestyle, anticipated benefit of metformin with DM control.  May help with weight loss as well. Discussed possible side effects. To start 500mg  daily, increase to BID in one week.  Labs as ordered

## 2010-04-04 NOTE — Progress Notes (Signed)
  Subjective:    Patient ID: Colleen Ford, female    DOB: 1965-02-28, 45 y.o.   MRN: [id number]  HPI Colleen Ford comes in today for follow up visit.  She has continued to have vaginal bleeding, scant, after intercourse.  She differentiates this from her menstrual bleeding, which started LMP 04/02/2010, continues now.  She had EMB on Jan 19, 2010 which revealed chronic endometritis with benign-appearing tissue.  She has also had microcytic anemia, normal iron studies and a HgbEF which was negative for hemoglobinopathies.  She has had negative coags as well.  Denies pelvic or vaginal pain. Also for followup of BP.  She reports she is watching diet, has lost 2-3 lbs since last visit.  Is trying to eat healthily. Not terribly active.  Regarding her depression, she feels well emotionally. PHQ2 is negative (not feeling depressed; no anhedonia).  Has not been keen on taking meds for her diabetes in the past. Reports that she is open to this today.    Review of Systems Denies polyuria or polydipsia, denies fevers or chills, no chest pain, no nausea or vomiting, no diarrhea.  Has soft formed nonbloody bowel movements 4 times a week.  No swelling in legs. No pain in feet.  Perhaps some blurred vision occasionally, Last dilated eye exam about 1 yr ago due for a recheck this month.      Objective:   Physical Exam  Constitutional: She appears well-developed and well-nourished. No distress.  HENT:  Head: Normocephalic.  Eyes: Conjunctivae are normal.  Neck: Normal range of motion. Neck supple.       Acanthosis nigricans noted around neck line.  Cardiovascular: Normal rate, regular rhythm and normal heart sounds.   Pulmonary/Chest: Effort normal and breath sounds normal.  Abdominal: Soft. Bowel sounds are normal. She exhibits no distension and no mass.  Musculoskeletal:       No ankle edema; palpable dp pulses bilaterally  Neurological:       Microfilament exam of feet negative for neuropathy.  Skin  intact in both feet, interdigitary skin as well.   Skin: She is not diaphoretic.  Psychiatric: She has a normal mood and affect. Her behavior is normal. Judgment and thought content normal.          Assessment & Plan:

## 2010-04-04 NOTE — Assessment & Plan Note (Signed)
Much improved on lisinopril; ran out of Dyazide capsules, has trouble taking the tablets (stuck in throat).  Will hold for now, continue with lifestyle modification and weight loss. Labs check at next visit in 2 months.

## 2010-04-05 ENCOUNTER — Telehealth: Payer: Self-pay | Admitting: Family Medicine

## 2010-04-05 ENCOUNTER — Encounter: Payer: Self-pay | Admitting: Family Medicine

## 2010-04-05 LAB — LDL CHOLESTEROL, DIRECT: Direct LDL: 117 mg/dL — ABNORMAL HIGH

## 2010-04-05 NOTE — Telephone Encounter (Signed)
Called patient to discuss labs.  DM control about the same as before, LDL cholesterol 117 is higher than goal for DM patients.  Discussed likely need for medicine to control, however will postpone until we see each other next, as patient is just starting metformin, dealing with vaginal bleeding, and titrating BP meds.  She would like a letter with her lab results.  Will mail.

## 2010-04-09 ENCOUNTER — Telehealth: Payer: Self-pay | Admitting: *Deleted

## 2010-04-09 NOTE — Telephone Encounter (Signed)
Called pt and informed of appointment at [facility] 04-30-10 at 3:00 pm. Had pt repeated back to me. Also advised to take picture ID and meds list to appointment. Arlyss Repress

## 2010-04-18 ENCOUNTER — Telehealth: Payer: Self-pay | Admitting: Family Medicine

## 2010-04-18 MED ORDER — SULFAMETHOXAZOLE-TRIMETHOPRIM 800-160 MG PO TABS: 1.0000 | ORAL_TABLET | Freq: Two times a day (BID) | ORAL | Status: AC

## 2010-04-18 NOTE — Telephone Encounter (Signed)
Patient started on Metformin on 2/29.  Yesterday she began having episodes of full bladder sensation but is unable to void more than a few drops when she goes to the bathroom.  This will then be followed by a normal voiding pattern.  She was wondering if it could be related to the Metformin.  Told her this was not a typical side effect, it sounded more like a bladder infection.  She did admit to strong smelling urine and described it as very dark yellow.  She also reports that she began fasting last Saturday at midnight.  When questioned about the specific of her fasting, it consists of breakfast and no other meal until after sundown.  Was wondering if this was going to be a problem given that she is on the Metformin.  I asked her if she was experiencing any symptoms of hypoglycemia.  She has had problems with headaches, nausea and "the shakes".  Told her I would discuss this with Dr. Mauricio Po and we would call her back.

## 2010-04-18 NOTE — Telephone Encounter (Signed)
Called patient back; has urinary retention, couple of drops and then feels like bladder filling up.  No dysuria.  Urine is strong-smelling.  Has been fasting at church.  No fevers or chills.  Feels like an early UTI, she has had them before.  Acknowledges NKDA.  Will treat for cystitis, she plans to interrupt her fast and eat a small snack when she feels shaky.  Will call back in next 1-2 days if not better.  To continue metformin.

## 2010-04-18 NOTE — Telephone Encounter (Signed)
Pt is requesting to speak with Dr Mauricio Po about Metformin.  Not able to urinate and feels like she needs to go all the time.  Wants to know if it from the metformin.

## 2010-04-30 ENCOUNTER — Other Ambulatory Visit (HOSPITAL_COMMUNITY)
Admission: RE | Admit: 2010-04-30 | Discharge: 2010-04-30 | Disposition: A | Discharge status: Home / Self Care | Payer: Self-pay | Source: Ambulatory Visit | Attending: Advanced Practice Midwife | Admitting: Advanced Practice Midwife

## 2010-04-30 ENCOUNTER — Encounter: Payer: Self-pay | Admitting: Advanced Practice Midwife

## 2010-04-30 ENCOUNTER — Other Ambulatory Visit: Payer: Self-pay | Admitting: Advanced Practice Midwife

## 2010-04-30 ENCOUNTER — Other Ambulatory Visit: Payer: Self-pay | Admitting: Obstetrics and Gynecology

## 2010-04-30 DIAGNOSIS — N841 Polyp of cervix uteri: Secondary | ICD-10-CM | POA: Insufficient documentation

## 2010-04-30 DIAGNOSIS — N938 Other specified abnormal uterine and vaginal bleeding: Secondary | ICD-10-CM

## 2010-04-30 DIAGNOSIS — N925 Other specified irregular menstruation: Secondary | ICD-10-CM

## 2010-04-30 DIAGNOSIS — N949 Unspecified condition associated with female genital organs and menstrual cycle: Secondary | ICD-10-CM

## 2010-04-30 DIAGNOSIS — R58 Hemorrhage, not elsewhere classified: Secondary | ICD-10-CM

## 2010-04-30 LAB — POCT PREGNANCY, URINE: Preg Test, Ur: NEGATIVE

## 2010-05-01 ENCOUNTER — Encounter: Payer: Self-pay | Admitting: Family Medicine

## 2010-05-01 LAB — CONVERTED CEMR LAB
Chlamydia, DNA Probe: NEGATIVE
GC Probe Amp, Genital: NEGATIVE
Trich, Wet Prep: NONE SEEN
Yeast Wet Prep HPF POC: NONE SEEN

## 2010-05-01 NOTE — Progress Notes (Unsigned)
NAMEEVERLENE, CUNNING           ACCOUNT NO.:  [id number]  MEDICAL RECORD NO.:  [id number]           PATIENT TYPE:  A  LOCATION:  [facility]                   FACILITY:  [facility]  PHYSICIAN:  Wynelle Bourgeois, CNM    DATE OF BIRTH:  Mar 04, 1965  DATE OF SERVICE:  04/30/2010                                 CLINIC NOTE  This is a 45 year old gravida 10, para 3-0-7-3, who is not pregnant who presents with complaints of bleeding and pain with intercourse.  She states that she started having the bleeding issue in late 2011 and only bleeds with intercourse.  She also reports a new onset of umbilical pain with intercourse, only 1 episode which started last week.  She was seen by [facility] for her routine GYN checkups.  Family practice doctor did an endometrial biopsy which was negative.  OB/GYN HISTORY:  Onset of periods at age 26.  It is regular, every 27 days, lasting 3 days.  She bleeds between periods only with sex. Contraception, bilateral tubal ligation.  OB HISTORY:  Remarkable for 10 pregnancies, 3 vaginal deliveries, 3 living children, and 7 miscarriages.  Her last Pap was in September 2011. She had a LEEP procedure in February 2010 for dysplasia but states that she was told it was not cancer.  She has been on yearly Paps after that.  MEDICAL HISTORY:  Remarkable for hypertension, diabetes, and high cholesterol.  SURGICAL HISTORY:  Remarkable for hernia repair and LEEP procedure.  She denies any alcohol or tobacco use, but does smoke occasional marijuana.  FAMILY HISTORY:  Remarkable for diabetes, heart disease, and high blood pressure.  REVIEW OF SYSTEMS:  The patient has intermittent nausea and diarrhea for medications, occasional constipation, and pain with intercourse, and bleeding with intercourse.  ALLERGIES:  None.  OBJECTIVE DATA:  VITAL SIGNS:  98.3, 84, blood pressure 154/102, weight 202, height 64 inches. ABDOMEN:  Soft and nontender with no  masses appreciated. GU:  EG/BUS within normal limits.  Vagina clear, well rugated with no lesions or discharge.  Cervix is multiparous, closed.  There is a polypoid bright red structure seen at the cervical office.  Uterus is normal, anteverted.  No masses are appreciated.  Adnexa, no masses although adnexa are difficult to appreciate secondary to habitus.  PROCEDURE:  Dr. Jolayne Panther was consulted and recommended biopsy and removal of the cervical polyp.  The polyp was grasped with a ring forceps and the polyp was easily removed and placed a specimen cup for pathology and several applications of Monsel solution were required to stop the bleeding of the friable cervical tissue.  The patient was informed of what to expect as pertains to discharge after the Monsel's applied.  ASSESSMENT: 1. Dysfunctional uterine bleeding related to intercourse. 2. Pain related to intercourse. 3. Cervical polyp, rule out endometrial polyp. 4. History of cervical dysplasia status post loop electrocautery     excision procedure.  PLAN: 1. Cervical polyp to Pathology. 2. GC Chlamydia and wet prep collected 3. UPT negative. 4. We will refer for ultrasound to evaluate for pelvic pathology. 5. Return in 2-3 weeks for results.  ______________________________ Wynelle Bourgeois, CNM    MW/MEDQ  D:  04/30/2010  T:  05/01/2010  Job:  [id number]

## 2010-05-04 ENCOUNTER — Encounter: Payer: Self-pay | Admitting: Family Medicine

## 2010-05-04 ENCOUNTER — Ambulatory Visit (INDEPENDENT_AMBULATORY_CARE_PROVIDER_SITE_OTHER): Payer: Self-pay | Admitting: Family Medicine

## 2010-05-04 VITALS — BP 114/80 | HR 84 | Wt 204.0 lb

## 2010-05-04 DIAGNOSIS — N926 Irregular menstruation, unspecified: Secondary | ICD-10-CM

## 2010-05-04 DIAGNOSIS — F3289 Other specified depressive episodes: Secondary | ICD-10-CM

## 2010-05-04 DIAGNOSIS — E119 Type 2 diabetes mellitus without complications: Secondary | ICD-10-CM

## 2010-05-04 DIAGNOSIS — F329 Major depressive disorder, single episode, unspecified: Secondary | ICD-10-CM

## 2010-05-04 DIAGNOSIS — Z111 Encounter for screening for respiratory tuberculosis: Secondary | ICD-10-CM

## 2010-05-04 NOTE — Assessment & Plan Note (Signed)
Patient tolerating Metformin well.  No GI effects now.  Discussed dietary changes, possibly for referral to nutritionist for further information.  Will recheck A1C in June, to see how much change with metformin.  She is trying to stop sodas.

## 2010-05-04 NOTE — Assessment & Plan Note (Signed)
Reports doing very well.  No SI.  More social, feels good about things in general.  Continue to monitor.

## 2010-05-04 NOTE — Progress Notes (Signed)
  Subjective:    Patient ID: Colleen Ford, female    DOB: [birth year], 45 y.o.   MRN: [id number]  HPI Alisabeth reports feeling well.  Was seen at [facility] Wynelle Bourgeois) for her complaint of bleeding with intercourse.  Not having intercourse recently.  Had biopsy of vaginal polyp, we reviewed E-Chart pathology report together in the room today, benign pathology.  She is tolerating the metformin well.  Has some questions about diet, has switched to diet sodas (used to drink more regular sodas).    Reports that her mood is very well.  Not feeling depressed.  Is more social now.    Review of Systems Denies chest pain or shortness of breath    Objective:   Physical Exam Well appearing, no apparent distress. Animated affect        Assessment & Plan:

## 2010-05-04 NOTE — Assessment & Plan Note (Signed)
Discussed findings of path report from 04/30/2010 at [facility], where she was seen by Wynelle Bourgeois.  Polyps, benign, which may account for bleeding when she has intercourse.  History of LEEP in the past.  To continue to monitor. Believes she has follow-up at [facility] clinic for this as well.

## 2010-05-04 NOTE — Patient Instructions (Signed)
It was a pleasure to see you today.  I believe your throat is irritated due to allergic pharyngitis.  I recommend you take Loratadine 10mg , one tablet by mouth once daily, for your throat and eye issues related to pollen allergies.   I would like to recheck your Hemoglobin A1C in another 2 to 3 months (sometime in the month of June).   I would like to see you then to see how you are doing with your diabetes.  Be sure to include a lot of fiber, vegetables, and less starch (white rice, breads), and reduce sweets.   MAKE APPOINTMENT WITH DR Mauricio Po FOR SOMETIME IN June FOR FOLLOWUP.

## 2010-05-07 ENCOUNTER — Ambulatory Visit (INDEPENDENT_AMBULATORY_CARE_PROVIDER_SITE_OTHER): Payer: Self-pay | Admitting: *Deleted

## 2010-05-07 DIAGNOSIS — Z111 Encounter for screening for respiratory tuberculosis: Secondary | ICD-10-CM

## 2010-05-07 DIAGNOSIS — IMO0001 Reserved for inherently not codable concepts without codable children: Secondary | ICD-10-CM

## 2010-05-07 LAB — TB SKIN TEST
Induration: 0
TB Skin Test: NEGATIVE mm

## 2010-05-07 NOTE — Progress Notes (Signed)
Addended by: Theresia Lo on: 05/07/2010 12:14 PM   Modules accepted: Level of Service

## 2010-05-07 NOTE — Progress Notes (Signed)
PPD negative-0 mm. 

## 2010-05-09 ENCOUNTER — Ambulatory Visit (HOSPITAL_COMMUNITY)
Admission: RE | Admit: 2010-05-09 | Discharge: 2010-05-09 | Disposition: A | Discharge status: Home / Self Care | Payer: Self-pay | Source: Ambulatory Visit | Attending: Obstetrics and Gynecology | Admitting: Obstetrics and Gynecology

## 2010-05-09 DIAGNOSIS — R58 Hemorrhage, not elsewhere classified: Secondary | ICD-10-CM

## 2010-05-09 DIAGNOSIS — N926 Irregular menstruation, unspecified: Secondary | ICD-10-CM | POA: Insufficient documentation

## 2010-05-09 IMAGING — US US TRANSVAGINAL NON-OB
1 series · 14 of 25 positions shown · non-contrast
Comparison: [DATE]

CLINICAL DATA: Irregular menses

TRANSVAGINAL ULTRASOUND OF PELVIS
TECHNIQUE: Transvaginal ultrasound examination of the pelvis was
performed including evaluation of the uterus, ovaries, adnexal
regions, and pelvic cul-de-sac.

[Series 1: us transvaginal non-ob · 14 of 49 slices shown]
[im 1/49]
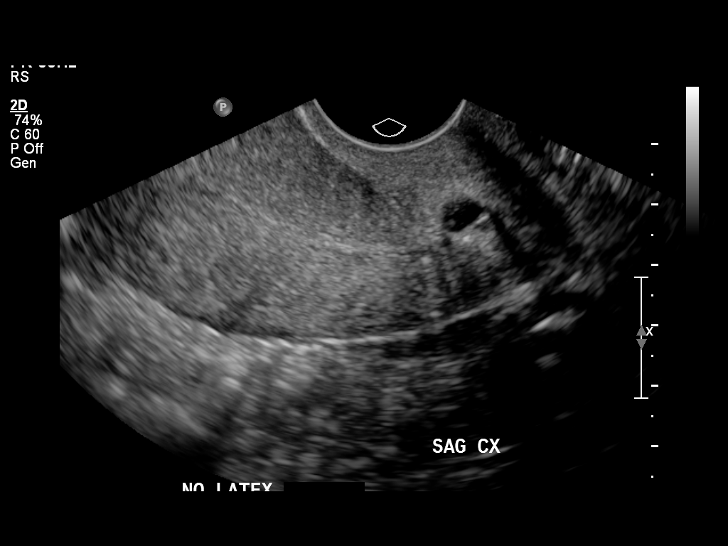
[im 5/49]
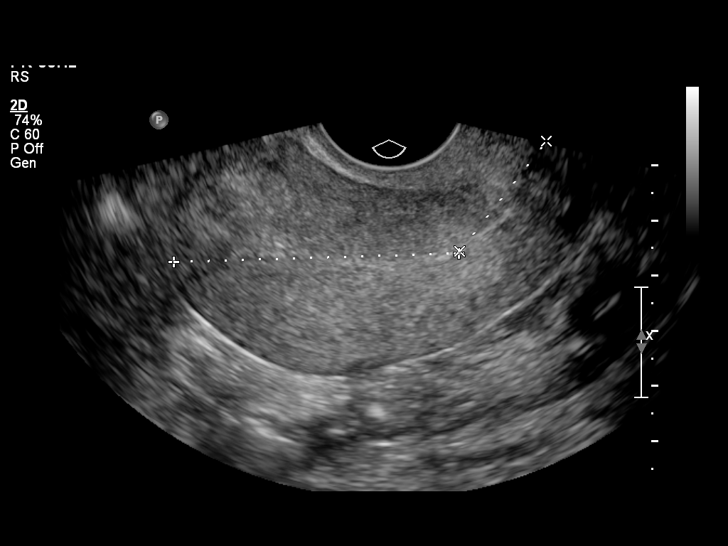
[im 9/49]
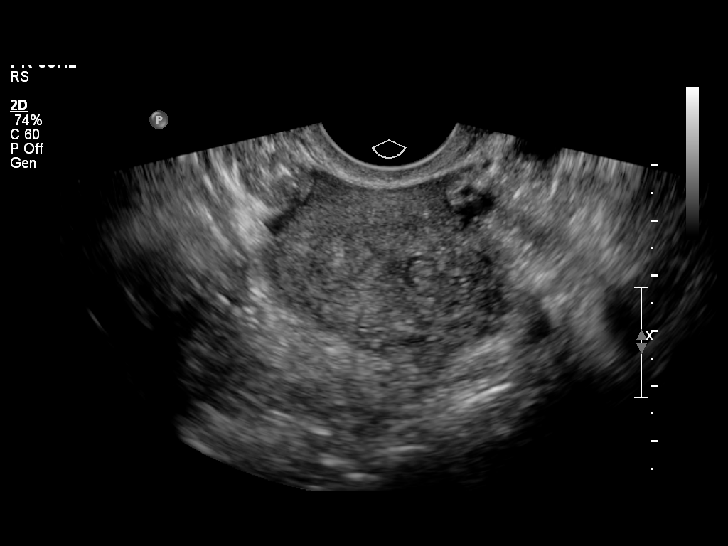
[im 13/49]
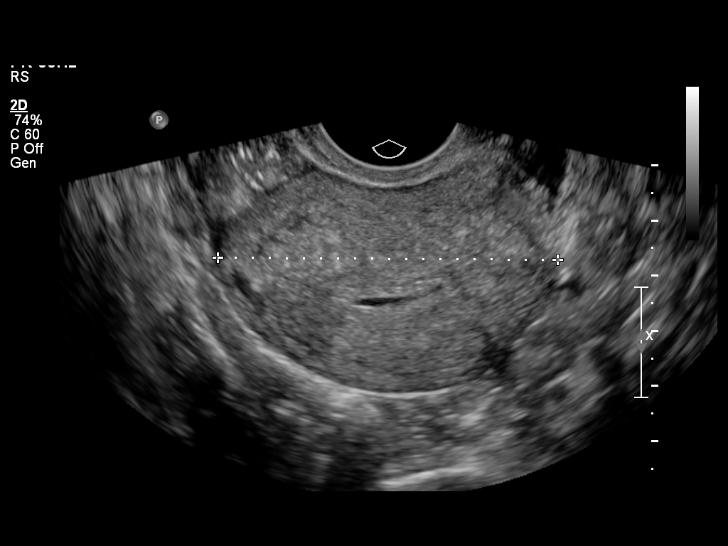
[im 17/49]
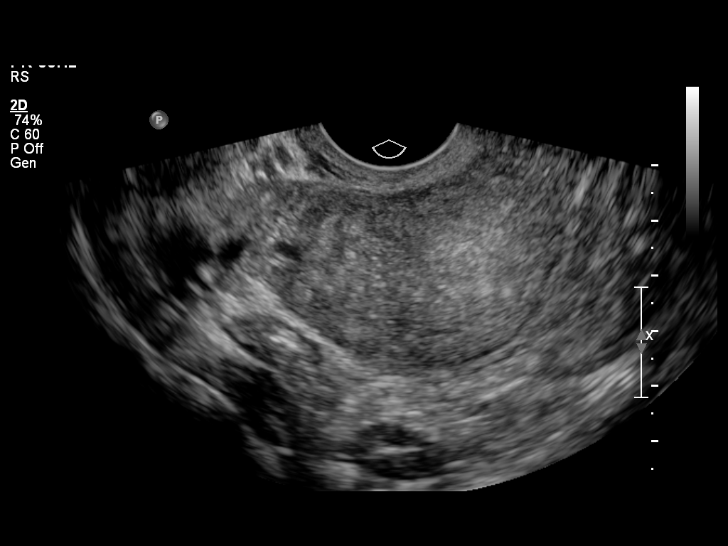
[im 19/49]
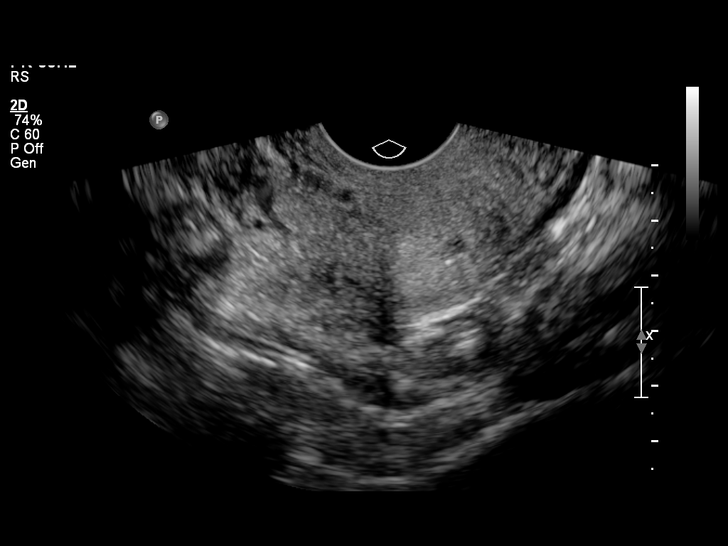
[im 23/49]
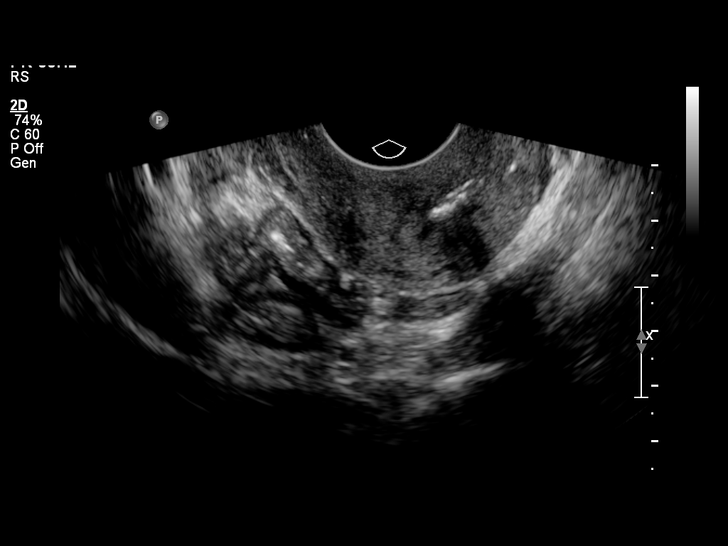
[im 27/49]
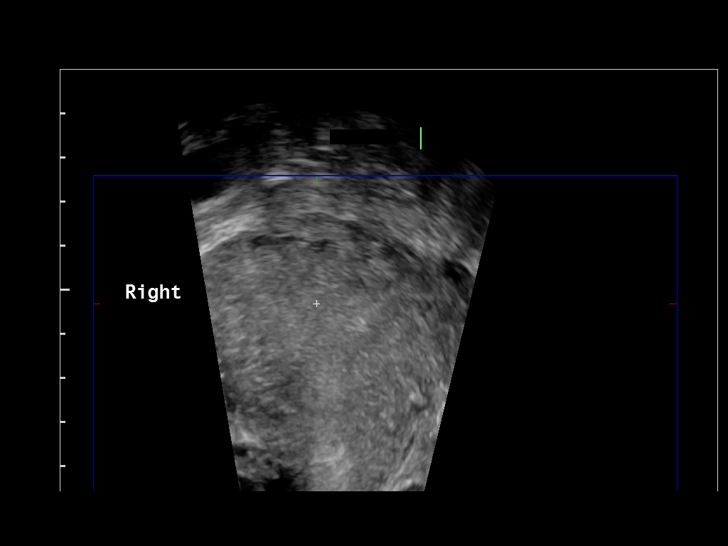
[im 31/49]
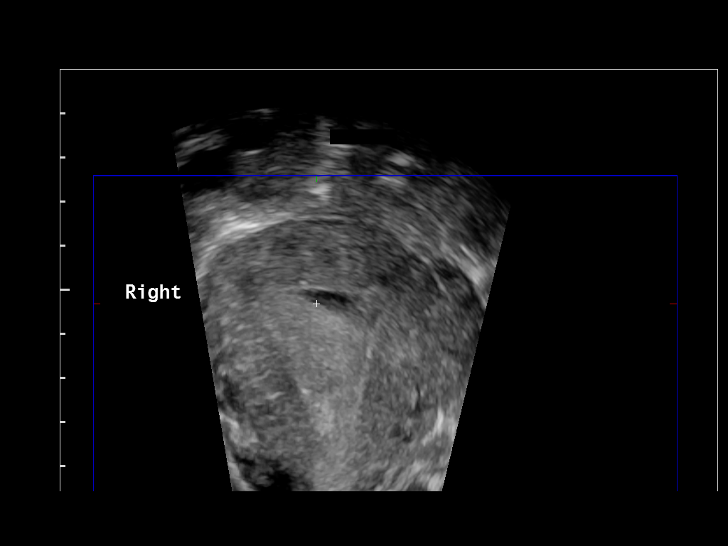
[im 33/49]
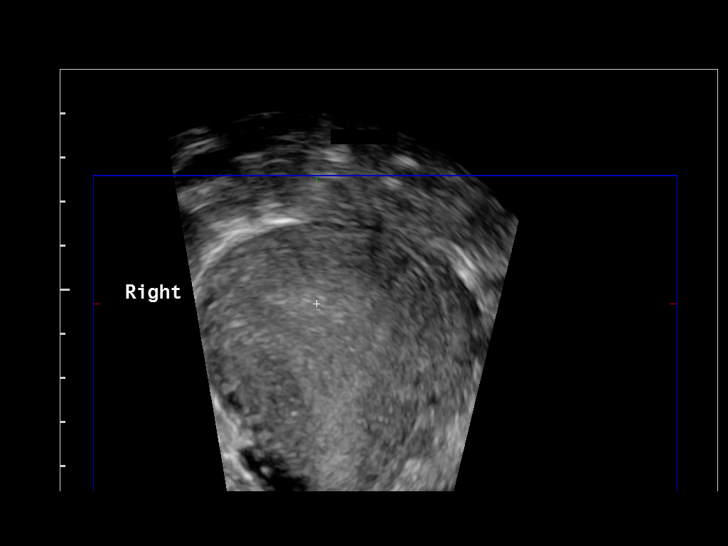
[im 37/49]
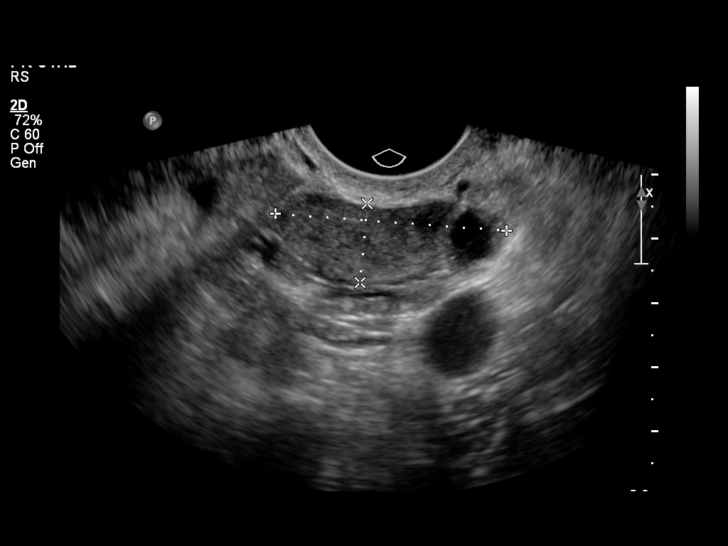
[im 41/49]
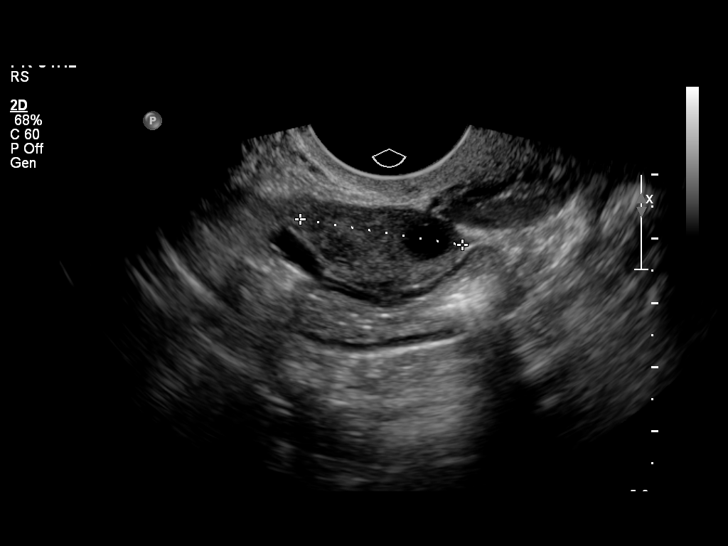
[im 45/49]
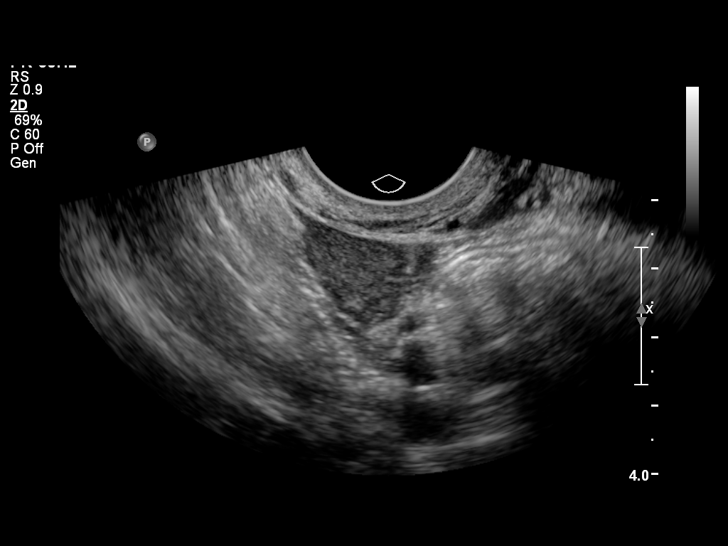
[im 49/49]
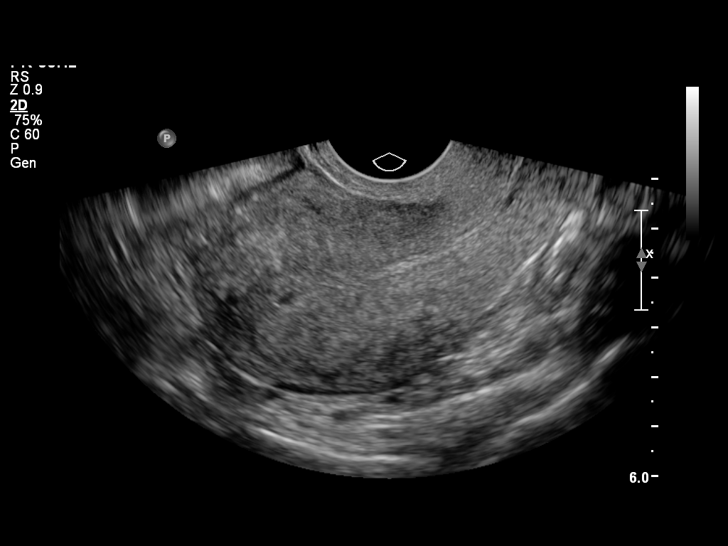

[14 of 25 positions shown; findings below may reference images not displayed]

FINDINGS: Uterus 7.6 x 6.2 x 4.0 cm.  Anteverted, anteflexed.  No focal
abnormality.

Endometrium  4 mm.  Uniformly thin and echogenic.  Trace fluid
within the endometrial canal incidentally noted.

Right Ovary 2.4 x 1.9 x 1.3 cm, normal.

Left Ovary 3.6 x 2.6 x 1.2 cm.  Normal.

Other Findings:  No free fluid.
IMPRESSION: Normal exam.

## 2010-05-25 ENCOUNTER — Ambulatory Visit: Payer: Self-pay | Admitting: Advanced Practice Midwife

## 2010-05-25 DIAGNOSIS — N840 Polyp of corpus uteri: Secondary | ICD-10-CM

## 2010-05-26 NOTE — Group Therapy Note (Unsigned)
Colleen Ford, Colleen Ford           ACCOUNT NO.:  [id number]  MEDICAL RECORD NO.:  [id number]           PATIENT TYPE:  A  LOCATION:  [facility]                   FACILITY:  [facility]  PHYSICIAN:  Wynelle Bourgeois, CNM    DATE OF BIRTH:  09-19-1965  DATE OF SERVICE:  05/25/2010                                 CLINIC NOTE  This is a 45 year old gravida 33, para 3-0-7-3 who presented in late March 2012, with complaints of bleeding and pain with intercourse.  The intermittent bleeding episodes started in late 2011 and bleeding only occurred with intercourse.  She also complains of some umbilical pain week prior to her visit.  On exam, she was found to have a cervical polyp protruding from the cervix which was easily removed and sent to Pathology.  She also had a GC Chlamydia culture done, both of these came back negative.  Pathology on the endometrial polyp showed inflamed and ulcerated endometrial polyp.  She has not had any bleeding since that visit, but notes that she has also not had intercourse since then.  We discussed her results and the possibility that removing the polyp may correct some of the heavy bleeding she has had with her recent periods. She may resume her normal activities and will pay attention to her periods for intermenstrual bleeding or heavy bleeding with periods and hopefully removal of this polyp will resolve all of these issues.  The patient was satisfied with this plan and will return as needed.          ______________________________ Wynelle Bourgeois, CNM    MW/MEDQ  D:  05/25/2010  T:  05/25/2010  Job:  [id number]

## 2010-07-03 ENCOUNTER — Other Ambulatory Visit: Payer: Self-pay | Admitting: Family Medicine

## 2010-07-03 NOTE — Telephone Encounter (Signed)
Refill request

## 2010-07-13 ENCOUNTER — Ambulatory Visit (INDEPENDENT_AMBULATORY_CARE_PROVIDER_SITE_OTHER): Payer: Self-pay | Admitting: Family Medicine

## 2010-07-13 ENCOUNTER — Encounter: Payer: Self-pay | Admitting: Family Medicine

## 2010-07-13 VITALS — BP 144/86 | HR 86 | Temp 98.5°F | Ht 64.0 in | Wt 199.0 lb

## 2010-07-13 DIAGNOSIS — E119 Type 2 diabetes mellitus without complications: Secondary | ICD-10-CM

## 2010-07-13 DIAGNOSIS — R1031 Right lower quadrant pain: Secondary | ICD-10-CM | POA: Insufficient documentation

## 2010-07-13 LAB — POCT GLYCOSYLATED HEMOGLOBIN (HGB A1C): Hemoglobin A1C: 7

## 2010-07-13 MED ORDER — LISINOPRIL 20 MG PO TABS: 20.0000 mg | ORAL_TABLET | Freq: Every day | ORAL | Status: DC

## 2010-07-13 NOTE — Progress Notes (Signed)
  Subjective:    Patient ID: Colleen Ford, female    DOB: [birth year], 45 y.o.   MRN: [id number]  HPI Patient here for follow up of DM; has been tolerating metformin well.  Taking lisinopril, no side effects.    Saw Gyn at [facility], told she had 'polyps' as the cause of her bleeding, polyps removed.   On April 4 she had a negative transvag US, Was treated for BV, which resolved after treatment.   LMP May 19-22; prior menses April 25-28.  Volume consistent with prior periods.   Also complains of pain in the R pelvic/groin area.  Had it at the grocery store the other day and had to stop walking.  No diarrhea, no constipation, no dysuria, no nausea or vomiting. Surgical history includes BTL. Review of Systems  Denies chest pain, cough shortness of breath.    Objective:   Physical Exam Well appearing, no apparent distress.  HEENT Neck supple.  COR RRR, no extra sounds.  PULM Clear bilaterally.  ABD Soft, nontender,nondistended. No CVA tenderness.  No masses or megaly.  Tenderness along the R inguinal fold with palpation. Full active and passive ROM R hip with add/abduction, internal and external rotation, flexion and extension.        Assessment & Plan:

## 2010-07-13 NOTE — Patient Instructions (Signed)
It was a pleasure to see you today.  I changed your lisinopril to 20mg  daily.   I am checking your Hgb A1C (glucose control) and we will decide whether we need to change the metformin then.   I believe the R groin pain is muscular in origin.  I recommend you take ibuprofen 200mg  tablets, take 2 to 4 tablets every 6 to 8 hours with food, for the next 7 to 10 days.  It is not recommended to take for prolonged periods of time.   I would to see you back in another 2 months, or sooner if needed.

## 2010-07-13 NOTE — Assessment & Plan Note (Signed)
Discussed meds; she has an A1C today which is 7%.  Will continue with same treatment plan.

## 2010-07-13 NOTE — Assessment & Plan Note (Signed)
Patient with complaint of pain in the R pelvic area, does not appear to be abdominal/pelvic.  Had unremarkable TVUS on April 4th.  Has full ROM; would treat conservatively now, with NSAIDS for the coming 7 to 10 days, follow up if not better.

## 2010-07-14 LAB — BASIC METABOLIC PANEL
BUN: 12 mg/dL (ref 6–23)
CO2: 26 mEq/L (ref 19–32)
Calcium: 9.3 mg/dL (ref 8.4–10.5)
Chloride: 101 mEq/L (ref 96–112)
Creat: 0.72 mg/dL (ref 0.50–1.10)
Glucose, Bld: 129 mg/dL — ABNORMAL HIGH (ref 70–99)
Potassium: 4 mEq/L (ref 3.5–5.3)
Sodium: 138 mEq/L (ref 135–145)

## 2010-08-29 ENCOUNTER — Telehealth: Payer: Self-pay | Admitting: Family Medicine

## 2010-08-29 ENCOUNTER — Encounter: Payer: Self-pay | Admitting: Family Medicine

## 2010-08-29 ENCOUNTER — Ambulatory Visit (INDEPENDENT_AMBULATORY_CARE_PROVIDER_SITE_OTHER): Payer: Self-pay | Admitting: Family Medicine

## 2010-08-29 ENCOUNTER — Telehealth: Payer: Self-pay | Admitting: *Deleted

## 2010-08-29 DIAGNOSIS — N898 Other specified noninflammatory disorders of vagina: Secondary | ICD-10-CM | POA: Insufficient documentation

## 2010-08-29 DIAGNOSIS — N760 Acute vaginitis: Secondary | ICD-10-CM

## 2010-08-29 LAB — POCT URINALYSIS DIPSTICK
Bilirubin, UA: NEGATIVE
Glucose, UA: NEGATIVE
Ketones, UA: NEGATIVE
Leukocytes, UA: NEGATIVE
Nitrite, UA: NEGATIVE
Protein, UA: NEGATIVE
Spec Grav, UA: 1.025
Urobilinogen, UA: 0.2
pH, UA: 6

## 2010-08-29 LAB — POCT UA - MICROSCOPIC ONLY

## 2010-08-29 MED ORDER — TRIAMCINOLONE ACETONIDE 0.5 % EX OINT: TOPICAL_OINTMENT | CUTANEOUS | Status: DC

## 2010-08-29 NOTE — Telephone Encounter (Signed)
Called pt and advised to have pharmacist to call us with the cream on the $4 list. She will do that. Waiting for call back from pharmacy. Lorenda Hatchet, Renato Battles

## 2010-08-29 NOTE — Progress Notes (Signed)
  Subjective:    Patient ID: Colleen Ford, female    DOB: 04-Aug-1965, 45 y.o.   MRN: [id number]  HPI [name] is here for complaint of vaginal irritation that began when she and partner used new "heat-up" condoms that get warm with friction. Had not used this kind before. Usually uses Magnums, which do not cause her irritation.  No vaginal discharge.  Began her menses the following day (July 19) which lasted for 3 days as usual.    Has had some concentrated urine with a strong odor, no true urethral irritation to void.  Has some irritation on skin when she urinates.   Review of Systems     Objective:   Physical Exam Well appearing, no apparent distress. Neck supple.  COR RRR, no extra sounds Genital: Mild erythema of vaginal vault.  No discharge, no lesions or vesicles.  External vaginal skin intact without erythema. No urethral erythema.       Assessment & Plan:

## 2010-08-29 NOTE — Assessment & Plan Note (Signed)
Patient with vaginal irritation that followed use of new condom brand 1 week ago.  No vaginal discharge; had menses the following day as expected.  Exam relatively unrevealing.  Will treat with topical Kenalog twice daily.  No douches or other new hygiene products.   Given complaint of possible "concentrated urine", I am ordering a UA today as well.  No true dysuria, but some irritation when urine makes contact with irritated vaginal mucosa.  To call patient if abnormal UA requiring treatment.

## 2010-08-29 NOTE — Progress Notes (Signed)
Addended by: [place], Carrin Vannostrand on: 08/29/2010 12:20 PM   Modules accepted: Orders

## 2010-08-29 NOTE — Patient Instructions (Signed)
It was a pleasure to see you today.  I believe your irritation is due to the new condoms.  Please avoid using new products to prevent this from happening again.  I sent a prescription for a topical steroid ointment, called Kenalog, to be applied to the affected area twice daily until the symptoms resolve.   No new hygiene products, and no douches.  Keep taking your other medications as prescribed.  I would like to see you back in the office in another 2 months, to check your hemoglobin A1C (diabetes control) and blood pressure.

## 2010-08-29 NOTE — Telephone Encounter (Signed)
Colleen Ford went to get her rx from [facility] and was informed that it was not on the $4 list, but that the cream for that same med was.  Need you to call in the cream version of the med for her.

## 2010-08-31 NOTE — Telephone Encounter (Signed)
Colleen Ford is calling back wondering if Dr. Mauricio Po would prescribe something less expensive than the Kenalog.  He had prescribed the Kenalog, but it was over $30 and she cannot afford that.  The Pharmacy was supposed to call Dr. Mauricio Po with the name of something equivalent that is on the $4 plan.

## 2010-08-31 NOTE — Telephone Encounter (Signed)
Patient requesting something cheaper than kenalog, cannot afford that med.Busick, Rodena Medin

## 2010-09-03 NOTE — Telephone Encounter (Signed)
I faxed in a new script to her pharmacy after business hours on Friday, July 27th.  Should be there now. Colleen Ford

## 2010-10-12 ENCOUNTER — Encounter: Payer: Self-pay | Admitting: Family Medicine

## 2010-12-12 ENCOUNTER — Other Ambulatory Visit (HOSPITAL_COMMUNITY)
Admission: RE | Admit: 2010-12-12 | Discharge: 2010-12-12 | Disposition: A | Discharge status: Home / Self Care | Payer: Self-pay | Source: Ambulatory Visit | Attending: Family Medicine | Admitting: Family Medicine

## 2010-12-12 ENCOUNTER — Encounter: Payer: Self-pay | Admitting: Family Medicine

## 2010-12-12 ENCOUNTER — Ambulatory Visit (INDEPENDENT_AMBULATORY_CARE_PROVIDER_SITE_OTHER): Payer: Self-pay | Admitting: Family Medicine

## 2010-12-12 VITALS — BP 144/80 | HR 86 | Temp 98.6°F | Ht 64.0 in | Wt 189.0 lb

## 2010-12-12 DIAGNOSIS — Z23 Encounter for immunization: Secondary | ICD-10-CM

## 2010-12-12 DIAGNOSIS — N899 Noninflammatory disorder of vagina, unspecified: Secondary | ICD-10-CM

## 2010-12-12 DIAGNOSIS — Z124 Encounter for screening for malignant neoplasm of cervix: Secondary | ICD-10-CM

## 2010-12-12 DIAGNOSIS — Z01419 Encounter for gynecological examination (general) (routine) without abnormal findings: Secondary | ICD-10-CM | POA: Insufficient documentation

## 2010-12-12 DIAGNOSIS — N760 Acute vaginitis: Secondary | ICD-10-CM

## 2010-12-12 DIAGNOSIS — N898 Other specified noninflammatory disorders of vagina: Secondary | ICD-10-CM

## 2010-12-12 DIAGNOSIS — E119 Type 2 diabetes mellitus without complications: Secondary | ICD-10-CM

## 2010-12-12 DIAGNOSIS — I10 Essential (primary) hypertension: Secondary | ICD-10-CM

## 2010-12-12 LAB — POCT GLYCOSYLATED HEMOGLOBIN (HGB A1C): Hemoglobin A1C: 6.8

## 2010-12-12 MED ORDER — LISINOPRIL 40 MG PO TABS: 40.0000 mg | ORAL_TABLET | Freq: Every day | ORAL | Status: DC

## 2010-12-12 NOTE — Patient Instructions (Signed)
It was a pleasure to see you today. I changed your lisinopril dosing from 20 mg to 40 mg daily. I sent a new prescription to [facility] on [address]. please take two tablets of the 20 mg once daily until they run out. Then you may fill the 40 mg tablets and take one daily.  Please schedule a return visit with the nurse and lab in 2-4 weeks to have a lab draw, blood pressure check, and EKG.  I would like to see you back in 4-6 weeks. Please bring the piece of paper that you need filled out for work.  SCHEDULE FOR RN VISIT FOR BP CHECK; ALSO FOR NONFASTING LABS AND ECG IN 2-4 WEEKS.  PHYSICIAN FOLLOW UP VISIT AFTER NURSE VISIT (4-6 WEEKS)

## 2010-12-12 NOTE — Assessment & Plan Note (Signed)
No changes in her medication regimen. Continue same; quarterly A1C checks.

## 2010-12-12 NOTE — Assessment & Plan Note (Signed)
Resolved after she stopped using the new condoms.  Not sexually active at present.  No abnormalities noted on her exam today.

## 2010-12-12 NOTE — Progress Notes (Signed)
  Subjective:    Patient ID: Colleen Ford, female    DOB: 24-Dec-1965, 45 y.o.   MRN: [id number]  HPI Here for checkup.  Had LEEP procedure in 2008 or thereabouts, had normal PAP and negative EMB last Fall (see Centricity notes).  Has also had vaginal polyp biopsied by OB/GYN service at [facility].    Reports some nonpainful "bumps" on external genitalia, without vaginal discharge.  No history of genital herpes.  Not sexually active at the present time. No dysuria.    Regarding her DM, has been taking the metformin as prescribed and tolerating well.    Review of Systems Denies chest pain or dyspnea; see HPI for remainder of ROS.    Objective:   Physical Exam Well appearing and no apparent distress.  HEENT Neck supple No cervical adenopathy COR S1S2, no extra sounds ABD Soft, nontender and nondistended.  GU: Normal external genitalia, no vesicles or other lesions noted.  Nonfriable cervix.  No pelvic masses, no CMT.       Assessment & Plan:

## 2010-12-12 NOTE — Assessment & Plan Note (Signed)
Increased dose of ACEI.  To recheck renal function, K+ at RN visit in the next 2-4 weeks; also to check ECG at that visit.  Follow up with me in the coming 4-6 weeks.

## 2010-12-13 ENCOUNTER — Telehealth: Payer: Self-pay | Admitting: Family Medicine

## 2010-12-13 LAB — GC/CHLAMYDIA PROBE AMP, GENITAL
Chlamydia, DNA Probe: NEGATIVE
GC Probe Amp, Genital: NEGATIVE

## 2010-12-13 NOTE — Telephone Encounter (Signed)
I called patient to inform of negative cervical cultures. I spoke with her, will forward PAP results when available.  Eliud Polo O

## 2010-12-14 ENCOUNTER — Encounter: Payer: Self-pay | Admitting: Family Medicine

## 2011-01-09 ENCOUNTER — Telehealth: Payer: Self-pay | Admitting: Family Medicine

## 2011-01-09 NOTE — Telephone Encounter (Signed)
Patient has been having increasing difficulty swallowing.  At first it was just with her medication and now says that it is happening with food as well.  Denies coughing or the sensation of anything going into her larynx.  Describes the sensation as food and pills getting stuck in her throat and she can feel them going down about 30 minutes later.  Afterwards her throat hurts.   Told her it sounded like GERD but I would rout this to Dr. Mauricio Po for suggestions as to whether or not she should be seen or try OTC Prilosec first.  Told her I would call her back once I hear back from Dr. Mauricio Po.

## 2011-01-09 NOTE — Telephone Encounter (Signed)
Would try OTC Prilosec 20mg  once daily; if not improved, could make appt.  Alternatively, if she would like to be seen by me on Friday, opening at 11am on December 7th. Blessing Zaucha O

## 2011-01-09 NOTE — Telephone Encounter (Signed)
Afraid to take metformin because of having difficulty swallowing.  Want to speak with nurse or provider about this.

## 2011-01-09 NOTE — Telephone Encounter (Signed)
Called patient and instructed to try the Prilosec.  Patient agreeable.

## 2011-01-11 ENCOUNTER — Ambulatory Visit (INDEPENDENT_AMBULATORY_CARE_PROVIDER_SITE_OTHER): Payer: Self-pay | Admitting: *Deleted

## 2011-01-11 ENCOUNTER — Other Ambulatory Visit: Payer: Self-pay

## 2011-01-11 VITALS — BP 132/90 | HR 104

## 2011-01-11 DIAGNOSIS — E119 Type 2 diabetes mellitus without complications: Secondary | ICD-10-CM

## 2011-01-11 DIAGNOSIS — I10 Essential (primary) hypertension: Secondary | ICD-10-CM

## 2011-01-11 LAB — BASIC METABOLIC PANEL
BUN: 8 mg/dL (ref 6–23)
CO2: 26 mEq/L (ref 19–32)
Calcium: 8.8 mg/dL (ref 8.4–10.5)
Chloride: 101 mEq/L (ref 96–112)
Creat: 0.78 mg/dL (ref 0.50–1.10)
Glucose, Bld: 136 mg/dL — ABNORMAL HIGH (ref 70–99)
Potassium: 3.7 mEq/L (ref 3.5–5.3)
Sodium: 139 mEq/L (ref 135–145)

## 2011-01-11 NOTE — Progress Notes (Signed)
Patient here today for BP check and EKG.  Patient has not take her medication today.  States she is scared to take it because it gets stuck in her throat. Dr. Mauricio Po had prescribed Prilosec for reflux to help with her swallowing but patient has not started that medication yet.  Patient also brought in form she needed filled out for her employment.

## 2011-01-11 NOTE — Progress Notes (Signed)
BMP DONE TODAY Colleen Ford 

## 2011-01-14 ENCOUNTER — Encounter: Payer: Self-pay | Admitting: Family Medicine

## 2011-04-08 ENCOUNTER — Telehealth: Payer: Self-pay | Admitting: Family Medicine

## 2011-04-08 NOTE — Telephone Encounter (Signed)
Called pt and informed of Dr.Breen's message. Pt verbalized understanding. .Colleen Ford  

## 2011-04-08 NOTE — Telephone Encounter (Signed)
If patient is going to fast until March 8, I recommend she have something sugary with her in the event her glucose is low.  She is not on any medications presently that would lower her blood sugar levels, so I do not recommend any changes/holding of her medications.  I ask that she be prepared to call off the fast if she experiences symptoms of low blood sugar (sweats, shakes, feeling light-headed). Paula Compton, M.D.

## 2011-04-08 NOTE — Telephone Encounter (Signed)
Patient is calling because her church is doing a fast and she wants to know if that is ok to participate in with the medications she is on.

## 2011-04-08 NOTE — Telephone Encounter (Signed)
Called pt. Pt is diabetic and wants to participate in fasting through church. It will last until 04-12-11. Per pastor can only drink water or juice and have fruit. I told the pt, that being a diabetic these would not be good choices. Pt reports, that she took her meds this morning with a bowl of oatmeal. I told the pt, that I would send her message to Dr.Breen and call her back. Pt agreed. Lorenda Hatchet, Renato Battles

## 2011-05-08 ENCOUNTER — Other Ambulatory Visit: Payer: Self-pay | Admitting: Family Medicine

## 2011-05-08 NOTE — Telephone Encounter (Signed)
Appointment scheduled with Dr. Mauricio Po 05/31/11 @ 11:00am. Patient's orange card has expired but she will try to meet with Jaynee Eagles before her appointment on 4/26.  Ileana Ladd

## 2011-05-08 NOTE — Telephone Encounter (Signed)
I have refilled med-  Patient is overdue for follow-up with Dr. Mauricio Po.  Please ask her to schedule appointment.

## 2011-05-08 NOTE — Telephone Encounter (Signed)
Metformin not on medication list.  Will forward to Dr. Earnest Bailey (Preceptor) to review.  Ileana Ladd

## 2011-05-31 ENCOUNTER — Encounter: Payer: Self-pay | Admitting: Family Medicine

## 2011-05-31 ENCOUNTER — Ambulatory Visit (INDEPENDENT_AMBULATORY_CARE_PROVIDER_SITE_OTHER): Payer: Self-pay | Admitting: Family Medicine

## 2011-05-31 VITALS — BP 169/98 | HR 76 | Ht 64.0 in | Wt 189.9 lb

## 2011-05-31 DIAGNOSIS — E119 Type 2 diabetes mellitus without complications: Secondary | ICD-10-CM

## 2011-05-31 DIAGNOSIS — I10 Essential (primary) hypertension: Secondary | ICD-10-CM

## 2011-05-31 LAB — POCT GLYCOSYLATED HEMOGLOBIN (HGB A1C): Hemoglobin A1C: 6.3

## 2011-05-31 MED ORDER — LISINOPRIL-HYDROCHLOROTHIAZIDE 20-25 MG PO TABS: 2.0000 | ORAL_TABLET | Freq: Every day | ORAL | Status: DC

## 2011-05-31 MED ORDER — METFORMIN HCL 500 MG PO TABS: 500.0000 mg | ORAL_TABLET | Freq: Two times a day (BID) | ORAL | Status: DC

## 2011-05-31 MED ORDER — LISINOPRIL-HYDROCHLOROTHIAZIDE 20-12.5 MG PO TABS: 2.0000 | ORAL_TABLET | Freq: Every day | ORAL | Status: DC

## 2011-05-31 NOTE — Assessment & Plan Note (Signed)
Congratulated on excellent control. Continues current meds, including aspirin. Recommended dilated funduscopic exam by eyecare professional.

## 2011-05-31 NOTE — Assessment & Plan Note (Signed)
Poorly controlled, not at goal of 130/80 for diabetic patients.  Will add HCTz to her regimen now; recheck BP at nurse visit and if not at goal during that visit, will likely add amlodipine 5mg  daily at that time.

## 2011-05-31 NOTE — Progress Notes (Signed)
Subjective:     Patient ID: Keriana Sarsfield, female   DOB: 08-11-1965, 46 y.o.   MRN: [id number]  HPI Ms Kimberlin is following up on her DM and HTN.   1. DM: on metformin 500mg  BID. No AE. HgbA1c 6.3 today, down from 6.8 at last visit.  Does not regularly check feet. Has not been to ophtho in about 2 years.   2. HTN: on lisinopril 40mg , well tolerated. BP today 155/105 at recheck. She does not have any side effects from this. Has not checked BP outside of clinic.  3. Obestiy: Is still attempting to lose weight. Walks everywhere due to loss of transportation. Cooks for herself, no fast food/little fried/no red meat/no pork.  Feels very well.  Other meds: Takes 81mg  aspirin  Review of Systems     Objective:   Physical Exam Gen: well-appearing, very pleasant HEENT: ncat, no conjunctival pallor CV: RRR, no r/g Lungs: CTAB, no increased WOB Skin: Feet without abrasions, wounds. Neuro: Sensation grossly intact. 4/4 monofilament exam in BL feet.    Assessment:         Plan:    1. DM: very well controlled. HgA1c is down to 6.3 from 6.8. She was encouraged to check her feet before going to bed. Additionally, encouraged to visit an ophthalmologist. Spent a significant amount of time with diabetes education. 2. HTN: BP still high with lisinopril 40mg . We discontinued her Lisinopril 40mg  for co-pay fee purposes. She will now take Lisinopril/HCTZ 20/12.5mg  BID. We asked Ms. Ciolino to withhold from taking her original lisinopril. She will follow up in 2 weeks for a nurse BP check. We anticipate prescribing her amlodipine after her next BP check. 3. Obesity: Refered her to nutritionist, per Ms. Shvartsman's request. Is eager to achieve goal weight of 180lbs.     Attending Note  Patient seen and examined with medical student Elinor Parkinson [facility] MS3), I agree with his documentation as above, with the following additions:  Patient reports feeling well.  No feelings of hypoglycemia; has been  walking more and adherent with medications.  Noted decrease in A1C since last visit. She is congratulated on this.  Bp control continues to be a problem.  Has been taking lisinopril 40mg  daily, without side effects.  She has had repeat renal function since the dose increase at last visit, with stable normal renal function.   Exam; Well appearing, no apparent distress HEENT Neck supple, without cervical adenopathy COR S1S2, no extra sounds PULM Clear bilaterally.  ABD No abdominal bruits appreciated with auscultation. LEs, no edema in ankles; monofilament testing on both feet with full and symmetric sensation bilat.  Paula Compton, MD

## 2011-05-31 NOTE — Patient Instructions (Addendum)
It was a pleasure to see you again today; your Diabetes control is excellent (hemoglobin A1C is 6.3%, down from 6.8% last time).  For your blood pressure, we are changing your medication today:  STOP taking the lisinopril 40mg  tablets that you have been taking;   START taking the "combo pill" of lisinopril 20mg /HCTZ 12.5mg , two tablets together, one time daily.     Please come to our office for a nurse visit to check your blood pressure, in about 2 weeks.  I will call you with further instructions after we see the response of your BP to this change.   NUTRITIONIST CONSULT WITH DR Wyona Almas IN [facility].

## 2011-06-14 ENCOUNTER — Ambulatory Visit (INDEPENDENT_AMBULATORY_CARE_PROVIDER_SITE_OTHER): Payer: Self-pay | Admitting: *Deleted

## 2011-06-14 ENCOUNTER — Encounter: Payer: Self-pay | Admitting: *Deleted

## 2011-06-14 VITALS — BP 120/86 | HR 84

## 2011-06-14 DIAGNOSIS — I10 Essential (primary) hypertension: Secondary | ICD-10-CM

## 2011-06-14 NOTE — Telephone Encounter (Signed)
This encounter was created in error - please disregard.

## 2011-06-14 NOTE — Progress Notes (Signed)
In for BP check. BP  Checked manually using regular adult cuff. BP bilaterally 120/86 pulse 84. Patient taking medications as directed.  Advised will send message to Dr. Mauricio Po .  Call back # is work number listed.    Blood pressure is at goal; may continue taking medications as prescribed.   Paula Compton, MD

## 2011-06-25 NOTE — Progress Notes (Addendum)
Patient given message form Dr. Mauricio Po. States recently she has been having headaches . Takes ibuorofen and it helps to ease a little. Checked her BP at [facility] with a reading of 117/80. Advised that BP is in a good range . If headaches continue to call for appointment.

## 2011-06-25 NOTE — Assessment & Plan Note (Signed)
Blood pressure controlled at nurse visit. Continue current med regimen, lifestyle modification.  Paula Compton, MD

## 2011-09-20 ENCOUNTER — Telehealth: Payer: Self-pay | Admitting: *Deleted

## 2011-09-20 NOTE — Telephone Encounter (Signed)
Pt is calling because there were some papers that were faxed over to our office from her job to be completed and was not sure if the person sending them put any of her information i.e name on this paperwork. Colleen Ford, Viann Shove

## 2011-09-23 NOTE — Telephone Encounter (Signed)
I have completed the form; to be returned to patient, who may forward to her prospective employer.  It contains protected patient information which the patient may choose to release to prospective employer (or else sign a ROI form and allow [place] to send directly to prospective employer).  Paula Compton, MD

## 2011-09-23 NOTE — Telephone Encounter (Signed)
[facility] notified Staff Medical Report is completed and she can pick it up at the front desk.  Ileana Ladd

## 2011-10-29 ENCOUNTER — Other Ambulatory Visit (HOSPITAL_COMMUNITY)
Admission: RE | Admit: 2011-10-29 | Discharge: 2011-10-29 | Disposition: A | Discharge status: Home / Self Care | Payer: Self-pay | Source: Ambulatory Visit | Attending: Family Medicine | Admitting: Family Medicine

## 2011-10-29 ENCOUNTER — Encounter: Payer: Self-pay | Admitting: Family Medicine

## 2011-10-29 ENCOUNTER — Ambulatory Visit (INDEPENDENT_AMBULATORY_CARE_PROVIDER_SITE_OTHER): Payer: Self-pay | Admitting: Family Medicine

## 2011-10-29 VITALS — BP 136/90 | HR 88 | Ht 64.0 in | Wt 197.3 lb

## 2011-10-29 DIAGNOSIS — Z23 Encounter for immunization: Secondary | ICD-10-CM

## 2011-10-29 DIAGNOSIS — Z113 Encounter for screening for infections with a predominantly sexual mode of transmission: Secondary | ICD-10-CM | POA: Insufficient documentation

## 2011-10-29 DIAGNOSIS — E119 Type 2 diabetes mellitus without complications: Secondary | ICD-10-CM

## 2011-10-29 DIAGNOSIS — R6889 Other general symptoms and signs: Secondary | ICD-10-CM

## 2011-10-29 DIAGNOSIS — N760 Acute vaginitis: Secondary | ICD-10-CM

## 2011-10-29 LAB — POCT WET PREP (WET MOUNT): Clue Cells Wet Prep Whiff POC: POSITIVE

## 2011-10-29 LAB — COMPREHENSIVE METABOLIC PANEL
ALT: 13 U/L (ref 0–35)
AST: 17 U/L (ref 0–37)
Albumin: 4.1 g/dL (ref 3.5–5.2)
Alkaline Phosphatase: 49 U/L (ref 39–117)
BUN: 15 mg/dL (ref 6–23)
CO2: 29 mEq/L (ref 19–32)
Calcium: 9.2 mg/dL (ref 8.4–10.5)
Chloride: 101 mEq/L (ref 96–112)
Creat: 0.86 mg/dL (ref 0.50–1.10)
Glucose, Bld: 126 mg/dL — ABNORMAL HIGH (ref 70–99)
Potassium: 3.7 mEq/L (ref 3.5–5.3)
Sodium: 137 mEq/L (ref 135–145)
Total Bilirubin: 0.3 mg/dL (ref 0.3–1.2)
Total Protein: 7.7 g/dL (ref 6.0–8.3)

## 2011-10-29 LAB — LIPID PANEL
Cholesterol: 204 mg/dL — ABNORMAL HIGH (ref 0–200)
HDL: 83 mg/dL (ref >39)
LDL Cholesterol: 98 mg/dL (ref 0–99)
Total CHOL/HDL Ratio: 2.5 Ratio
Triglycerides: 114 mg/dL (ref <150)
VLDL: 23 mg/dL (ref 0–40)

## 2011-10-29 LAB — TSH: TSH: 1.6 u[IU]/mL (ref 0.350–4.500)

## 2011-10-29 LAB — POCT GLYCOSYLATED HEMOGLOBIN (HGB A1C): Hemoglobin A1C: 6.8

## 2011-10-29 MED ORDER — METRONIDAZOLE 500 MG PO TABS: 500.0000 mg | ORAL_TABLET | Freq: Two times a day (BID) | ORAL | Status: DC

## 2011-10-29 NOTE — Progress Notes (Signed)
  Subjective:    Patient ID: Colleen Ford, female    DOB: 09/06/1965, 46 y.o.   MRN: [id number]  HPI Colleen Ford is here today for complaint of large clot passed while in shower on Sat, Sept 21.  Passed per vagina.  Has some bleeding afterward. At the timing of her usual menses (started 9/20, ended after the clot passed). No sexual activity in over 6 months.  Penultimate menses Aug 27-30; prior July 31-Aug 03.  No gyn surgeries.   ROS reveals presence of vaginal discharge with internal itch (no skin changes or external itch per patient).  Malodorous; makes her self-conscious about her hygiene.   Prior [facility] history [facility]  Did not stay in relationship with that partner after the STI diagnosis.   Last PAP Nov 2012, normal.   ROS significant for increased cold intolerance.  No family hx thyroid disease.   Colleen Ford reports that she had her glucose checked by the health ministry nurse in her church, was told it was 'high, I needed to see my doctor right away'. No other points of reference regarding recent glucose.  Last A1C was 6.3% in late April.    Review of Systems     Objective:   Physical Exam Well appearing, no apparent distress HEENT Neck supple. No thyroid nodules or tenderness, no goiter. No adenopathy COR Regular S1S2 PULM Clear bilat ABD soft, nontender, nondistended GYN: speculum exam with normal vulva, normal vaginal mucosa. Scant thin discharge. No active bleeding or clots. No CMT.      Assessment & Plan:

## 2011-10-29 NOTE — Patient Instructions (Addendum)
It was a pleasure to see you today.  Given the fact that your periods are coming at regular intervals and have not changed in duration, I believe the clot was likely not anything abnormal.  I am doing a microscope exam to look for causes of the vaginal discharge and itch.  I will call you at your home number with results (home number reviewed).  I am drawing labs for cholesterol, sugar, and kidney/liver studies today; I will be in touch with the results.   I recommend a flu shot today to help keep you from getting the flu.

## 2011-10-30 ENCOUNTER — Encounter: Payer: Self-pay | Admitting: Family Medicine

## 2011-10-30 NOTE — Assessment & Plan Note (Signed)
Wet prep with clue cells. To treat with oral metronidazole for one week; she is to return if not better. Cervical cultures done today as well.

## 2011-10-30 NOTE — Assessment & Plan Note (Signed)
Has noticed worsening cold intolerance; also notes her glucose control to be worse despite no change in diet; compliant with her medications. No family hx of thyroid disease.  For check of TSH to rule this out.

## 2011-10-31 ENCOUNTER — Telehealth: Payer: Self-pay | Admitting: Family Medicine

## 2011-10-31 NOTE — Telephone Encounter (Signed)
Called patient to report negative cervical cultures.  She acknowledges results.  Paula Compton, MD

## 2011-12-02 ENCOUNTER — Other Ambulatory Visit: Payer: Self-pay | Admitting: Family Medicine

## 2012-05-05 ENCOUNTER — Ambulatory Visit: Payer: Self-pay | Admitting: Family Medicine

## 2012-05-12 ENCOUNTER — Ambulatory Visit: Payer: Self-pay | Admitting: Family Medicine

## 2012-05-19 ENCOUNTER — Ambulatory Visit: Payer: Self-pay | Admitting: Family Medicine

## 2012-05-28 ENCOUNTER — Telehealth: Payer: Self-pay | Admitting: Family Medicine

## 2012-05-28 NOTE — Telephone Encounter (Signed)
Patient is calling because her she is out of refills on Metformin.  She is aware that she hasn't been seen since last September and she intends on calling next week to schedule an appointment.

## 2012-05-28 NOTE — Telephone Encounter (Signed)
Will route to PCP 

## 2012-06-01 MED ORDER — METFORMIN HCL 500 MG PO TABS: 500.0000 mg | ORAL_TABLET | Freq: Two times a day (BID) | ORAL | Status: DC

## 2012-06-01 NOTE — Telephone Encounter (Signed)
I put in a 1-time refill.  I believe the protocol is for patients to make requests through their pharmacy, which in turn sends the electronic refill request to our office.  In any case, patient needs an appointment in the office for any further refills. JB

## 2012-06-01 NOTE — Telephone Encounter (Signed)
Called patient and informed of refill request process.  Patient verbalized understanding.  Appt scheduled with Dr. Mauricio Po for 06/19/12 at 4:00 pm.  Gaylene Brooks, RN

## 2012-06-19 ENCOUNTER — Ambulatory Visit: Payer: Self-pay | Admitting: Family Medicine

## 2012-06-30 ENCOUNTER — Encounter: Payer: Self-pay | Admitting: Family Medicine

## 2012-06-30 ENCOUNTER — Ambulatory Visit (INDEPENDENT_AMBULATORY_CARE_PROVIDER_SITE_OTHER): Payer: Medicaid Other | Admitting: Family Medicine

## 2012-06-30 VITALS — BP 128/80 | HR 99 | Temp 98.5°F | Ht 64.0 in | Wt 203.0 lb

## 2012-06-30 DIAGNOSIS — M1712 Unilateral primary osteoarthritis, left knee: Secondary | ICD-10-CM | POA: Insufficient documentation

## 2012-06-30 DIAGNOSIS — R232 Flushing: Secondary | ICD-10-CM

## 2012-06-30 DIAGNOSIS — M1710 Unilateral primary osteoarthritis, unspecified knee: Secondary | ICD-10-CM

## 2012-06-30 DIAGNOSIS — N951 Menopausal and female climacteric states: Secondary | ICD-10-CM

## 2012-06-30 DIAGNOSIS — R109 Unspecified abdominal pain: Secondary | ICD-10-CM | POA: Insufficient documentation

## 2012-06-30 DIAGNOSIS — IMO0002 Reserved for concepts with insufficient information to code with codable children: Secondary | ICD-10-CM

## 2012-06-30 DIAGNOSIS — E119 Type 2 diabetes mellitus without complications: Secondary | ICD-10-CM

## 2012-06-30 LAB — BASIC METABOLIC PANEL
BUN: 14 mg/dL (ref 6–23)
CO2: 29 mEq/L (ref 19–32)
Calcium: 9 mg/dL (ref 8.4–10.5)
Chloride: 100 mEq/L (ref 96–112)
Creat: 0.77 mg/dL (ref 0.50–1.10)
Glucose, Bld: 151 mg/dL — ABNORMAL HIGH (ref 70–99)
Potassium: 3.6 mEq/L (ref 3.5–5.3)
Sodium: 140 mEq/L (ref 135–145)

## 2012-06-30 LAB — POCT GLYCOSYLATED HEMOGLOBIN (HGB A1C): Hemoglobin A1C: 7.2

## 2012-06-30 LAB — FSH/LH
FSH: 12.4 m[IU]/mL
LH: 9.8 m[IU]/mL

## 2012-06-30 LAB — TSH: TSH: 1.642 u[IU]/mL (ref 0.350–4.500)

## 2012-06-30 NOTE — Assessment & Plan Note (Addendum)
Slipping A1C to 7.2 from 6.8% in Sept 24th, 2013.  She is still taking her metformin and baby aspirin.  Has gained 7 lbs since last visit in Sept.  Discussed lifestyle changes, such as diet.  Physical activity is limited somewhat by the left knee pain, which is the vicious circle of obesity-related joint pain. Acetaminophen as needed for this. Regarding DM, will consider increase in metformin at follow up. Retinal scan today.  Also, based on most recent lipid panel and other information, her 10-yr CV risk is 3.9% (lower than I would have thought based on DM, HTN diagnosis).  Therefore, will not initiate statin therapy at this time.

## 2012-06-30 NOTE — Assessment & Plan Note (Signed)
Diagnosis by history and exam.  Discussed use of strengthening exercises (ie, cans in a bucket) for quad strengthening; acetaminophen use as needed.  To look for ways to reduce obesity to lower stress on the joint.  Non-weight bearing exercises (bike, water sports).

## 2012-06-30 NOTE — Addendum Note (Signed)
Addended by: Jennette Bill on: 06/30/2012 11:01 AM   Modules accepted: Orders

## 2012-06-30 NOTE — Patient Instructions (Addendum)
It was a pleasure to see you today.   For the changes in your bowel habits, I recommend the following:  1. Use the bathroom at the same time every day.  2. Use 1 tablespoon of either metamucil (fiber laxative containing "psyllium") OR MiraLax, in a tall glass of water, one time daily.  3. Follow up in 2 to 3 weeks or sooner as needed.  For the hot flashes, I am checking your labs today.   For the diabetes and weight gain, I would like to do the following:  We are checking your eyes today with our retinal camera. I would like you to see our Health Coach, Arlys John, regarding lifestyle changes to help bring down your weight and Hemoglobin A1C.  If it is not better by the next check (3 months), then I will want to increase your Metformin medication.   Please keep taking the aspirin 81mg  one time daily for heart health.   Follow up with Dr Mauricio Po in 2- 3 weeks for bowel issues, and Arlys John for DM lifestyle issues.

## 2012-06-30 NOTE — Progress Notes (Signed)
  Subjective:    Patient ID: Colleen Ford, female    DOB: [birth year], 47 y.o.   MRN: [id number]  HPI Colleen Ford comes in today for follow up/addressing several items: 1. DM:  She  Continues to take metformin 500mg  twice daily, but does not like the large size of the pills.  Is taking Asa as well.  2. Hot flashes: 2 weeks ago, she was resting in her room, had sudden onset of feeling "chills", describes as "like the windows were opened in middle of winter".  No one else in her house felt this way.  Checked her Temp and was 100.35F.  Then went to feeling very hot, had to lay on bed unclothed in order to get cool.  She has had hot and cold spells intermittently since this initial episode.  No further temperature elevations.  Did nto feel "ill" with first episode.  Has continued to have a mild dry cough but nothing else; ascribes to seasonal pollen exposure.  Is a non-smoker.  LMP May 2-4; previous menses April 11-13, March 16-19th, all normal for her.  3. On Sat, May 17th, had swelling of left upper lip.  Eventually spread to include the entire upper lip.  No changes in diet that day (ate chicken burgers); no seafood or seasonings.  Has been taking her lisinopril/HCTZ continually before and since then, without recurrence.  Has been well since that day.  4. C/o intermittent cramping peri-umbilical, since first week of May, accompanied by passage of watery stool which brings relief from cramping.  Also some intermittent constipation. Stools are non-bloody.  She states she has had some troubles with constipation for much of her adult life.  No pain with defecation.  No tarry stools.  No emesis.  No family history of Colorectal cancer.  Patient has never had colonoscopy for any reason.  Her only surgical history is inguinal herniorraphy.   5. C/o L knee pain, worse with weight bearing and walking up steps.  No falls or trauma.  No skin changes.   Review of Systems  See HPI.      Objective:   Physical  Exam Well appearing, no apparent distress HEENT Neck supple. Thyroid supple. No cervical adenopathy.  COR Regular S1S2  PULM Clear bilaterally, no rales or wheezes ABD Soft, nontender, nondistended.  No organomegaly. Audible bowel sounds. No masses.   MSK: L knee with exquisite tenderness in medial and lateral joint space. Full active and passive ROM with flexion/extension of L knee. No edema of L leg/calf.       Assessment & Plan:

## 2012-06-30 NOTE — Assessment & Plan Note (Signed)
Chills and hot spells, ongoing for the past couple of weeks. Unsure of age at which her mother went through menopause.  Will check FSH given that she is still having regular menstrual periods (likely perimenopausal).

## 2012-07-01 ENCOUNTER — Encounter: Payer: Self-pay | Admitting: Family Medicine

## 2012-07-07 ENCOUNTER — Other Ambulatory Visit: Payer: Self-pay | Admitting: Family Medicine

## 2012-07-07 ENCOUNTER — Other Ambulatory Visit: Payer: Self-pay | Admitting: *Deleted

## 2012-07-07 MED ORDER — LISINOPRIL-HYDROCHLOROTHIAZIDE 20-12.5 MG PO TABS: 2.0000 | ORAL_TABLET | Freq: Every day | ORAL | Status: DC

## 2012-07-07 NOTE — Telephone Encounter (Signed)
Requested Prescriptions   Pending Prescriptions Disp Refills  . lisinopril-hydrochlorothiazide (PRINZIDE,ZESTORETIC) 20-12.5 MG per tablet 60 tablet 4

## 2012-07-17 ENCOUNTER — Ambulatory Visit: Payer: Self-pay | Admitting: Family Medicine

## 2012-08-28 ENCOUNTER — Encounter: Payer: Self-pay | Admitting: Family Medicine

## 2012-08-28 ENCOUNTER — Ambulatory Visit (INDEPENDENT_AMBULATORY_CARE_PROVIDER_SITE_OTHER): Payer: Medicaid Other | Admitting: Family Medicine

## 2012-08-28 ENCOUNTER — Other Ambulatory Visit: Payer: Self-pay | Admitting: Family Medicine

## 2012-08-28 VITALS — BP 120/82 | HR 80 | Ht 64.0 in | Wt 196.0 lb

## 2012-08-28 DIAGNOSIS — M25562 Pain in left knee: Secondary | ICD-10-CM | POA: Insufficient documentation

## 2012-08-28 DIAGNOSIS — E119 Type 2 diabetes mellitus without complications: Secondary | ICD-10-CM

## 2012-08-28 DIAGNOSIS — M25569 Pain in unspecified knee: Secondary | ICD-10-CM

## 2012-08-28 DIAGNOSIS — N926 Irregular menstruation, unspecified: Secondary | ICD-10-CM

## 2012-08-28 DIAGNOSIS — M25561 Pain in right knee: Secondary | ICD-10-CM

## 2012-08-28 LAB — POCT URINE PREGNANCY: Preg Test, Ur: NEGATIVE

## 2012-08-28 MED ORDER — ACCU-CHEK NANO SMARTVIEW W/DEVICE KIT: 1.0000 | PACK | Status: DC

## 2012-08-28 MED ORDER — ACCU-CHEK FASTCLIX LANCETS MISC: 1.0000 [IU] | Freq: Every day | Status: DC

## 2012-08-28 MED ORDER — GLUCOSE BLOOD VI STRP: 1.0000 | ORAL_STRIP | Freq: Every day | Status: DC

## 2012-08-28 NOTE — Assessment & Plan Note (Signed)
Bilateral knee pain with movement.  Suspect DJD.  Standing bilat knee xrays.

## 2012-08-28 NOTE — Assessment & Plan Note (Signed)
Adequately controlled by A1C measurement.  She had a slight step backward in the A1C from her previous check.  Glucometer and accessories prescribed today, discussed strategy for measuring CBGs at different times of day to get a panoramic view of glucose control.  She is continuing with metformin.  Recheck A1C at next scheduled visit in 2 months.

## 2012-08-28 NOTE — Telephone Encounter (Signed)
Pt is requesting that metformin be called in to [facility]. JW

## 2012-08-28 NOTE — Patient Instructions (Addendum)
It was a pleasure to see you today.    I sent in a prescription for your glucometer and test strips/lancets to your pharmacy.  Instructions are for once a day checks.   I sent orders for xrays of your knees. I will call you when the results are available.   I would like to see you back in 2 months for follow up.   2 MONTH FOLLOW UP WITH DR Mauricio Po

## 2012-08-28 NOTE — Assessment & Plan Note (Addendum)
Colleen Ford missed menses in month of July (2014), but did have the mild discharge and odor that she is accustomed to having with her period.  Recent workup for perimenopausal/ovarian failure is negative.  She has had a BTL, and has a negative UPreg today. She is expecting to have her menses again in early August.

## 2012-08-28 NOTE — Progress Notes (Signed)
  Subjective:    Patient ID: Colleen Ford, female    DOB: 1965/07/31, 47 y.o.   MRN: [id number]  HPI Colleen Ford is here for follow up of a few issues:  1. DM-- she believes her DM is under relatively good control.  Does not have the glucometer to check CBGs at home.  Her most recent A1C in May 2014 was mildly higher than her previous (was 7.2% in May).  She had a fundoscopic retinal scan done in May, which we reviewed today and which shows no evidence of DM retinopathy.  2. Menstrual issues:  She missed the bleeding part of her period in July.  Last normal menses was June 08th.  Her usual pattern is to have mild discharge followed by spotting and 'foul odor", which resolves when she has 2 days of what she describes as 'moderate' bleeding (odor resolves at this point in her cycle).  She had workup for possible ovarian failure/perimenopause in May; labs did not confirm a source of her menstrual irregularity (FSH, TSH).  She is s/p BTL.  3. She continues with pain in both knees, which is worse when she stands and ambulates.  No trauma to knees.  We had planned to image with standing xrays in the past, but were unable to because she did not yet have Medicaid.   4. 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  She asks that I fill out a medical clearance form for her, which I am happy to do (form completed and returned to [place] during our visit).    Review of Systems See above. No chest pain or cough; she is a never-smoker. No constipation or diarrhea.      Objective:   Physical Exam Well-appearing, no apparent distress HEENT Neck supple. No cervical adenopathy. Moist mucus membranes.  COR Regular S1S2, no extra sounds or murmurs. PULM Clear bilaterally, no rales or wheezes.  LEs; No edema in ankles.  No knee effusions noted, no erythema around knees.  She does have bilateral medial and lateral joint space tenderness. Full active/passive ROM, negative  drawer signs.  Palpable dp pulses bilaterally; no skin lesions or breakdown.  Sensation with monofilament is intact bilaterally.       Assessment & Plan:

## 2012-08-31 MED ORDER — METFORMIN HCL 500 MG PO TABS: ORAL_TABLET | ORAL | Status: DC

## 2012-08-31 NOTE — Telephone Encounter (Signed)
Patient needs this refilled right away because she hasn't had any since last Thursday.

## 2012-08-31 NOTE — Addendum Note (Signed)
Addended by: Barbaraann Barthel on: 08/31/2012 09:06 PM   Modules accepted: Orders

## 2012-09-02 ENCOUNTER — Other Ambulatory Visit: Payer: Self-pay | Admitting: Family Medicine

## 2012-09-02 NOTE — Progress Notes (Signed)
Attempt to write for [employer]ia Plus glucometer and strips/lancets.  These do not come up on Epic when ordered.  Will write paper scripts. JB

## 2012-09-04 ENCOUNTER — Ambulatory Visit (INDEPENDENT_AMBULATORY_CARE_PROVIDER_SITE_OTHER): Payer: Medicaid Other | Admitting: *Deleted

## 2012-09-04 DIAGNOSIS — Z111 Encounter for screening for respiratory tuberculosis: Secondary | ICD-10-CM

## 2012-09-04 NOTE — Progress Notes (Signed)
PPD Placement note Kennyth Arnold, 47 y.o. female is here today for placement of PPD test Reason for PPD test: work Pt taken PPD test before: yes Verified in allergy area and with patient that they are not allergic to the products PPD is made of (Phenol or Tween). Yes Is patient taking any oral or IV steroid medication now or have they taken it in the last month? no Has the patient ever received the BCG vaccine?: no Has the patient been in recent contact with anyone known or suspected of having active TB disease?: no    O: Alert and oriented in NAD. P:  PPD placed on 09/04/2012.  Patient advised to return for reading within 48-72 hours. Wyatt Haste, RN-BSN

## 2012-09-07 ENCOUNTER — Other Ambulatory Visit: Payer: Self-pay | Admitting: Family Medicine

## 2012-09-07 ENCOUNTER — Encounter: Payer: Self-pay | Admitting: *Deleted

## 2012-09-07 ENCOUNTER — Ambulatory Visit
Admission: RE | Admit: 2012-09-07 | Discharge: 2012-09-07 | Disposition: A | Discharge status: Home / Self Care | Payer: Medicaid Other | Source: Ambulatory Visit | Attending: Family Medicine | Admitting: Family Medicine

## 2012-09-07 ENCOUNTER — Ambulatory Visit: Payer: Medicaid Other | Admitting: *Deleted

## 2012-09-07 DIAGNOSIS — Z111 Encounter for screening for respiratory tuberculosis: Secondary | ICD-10-CM

## 2012-09-07 DIAGNOSIS — M25562 Pain in left knee: Secondary | ICD-10-CM

## 2012-09-07 DIAGNOSIS — M25561 Pain in right knee: Secondary | ICD-10-CM

## 2012-09-07 LAB — TB SKIN TEST
Induration: 0 mm
TB Skin Test: NEGATIVE

## 2012-09-07 IMAGING — CR DG KNEE 1-2V*L*
2 series · 2 of 2 positions shown · non-contrast
Comparison: None.

CLINICAL DATA: Pain and stiffness

LEFT KNEE - 1-2 VIEW

[w knee ap left]
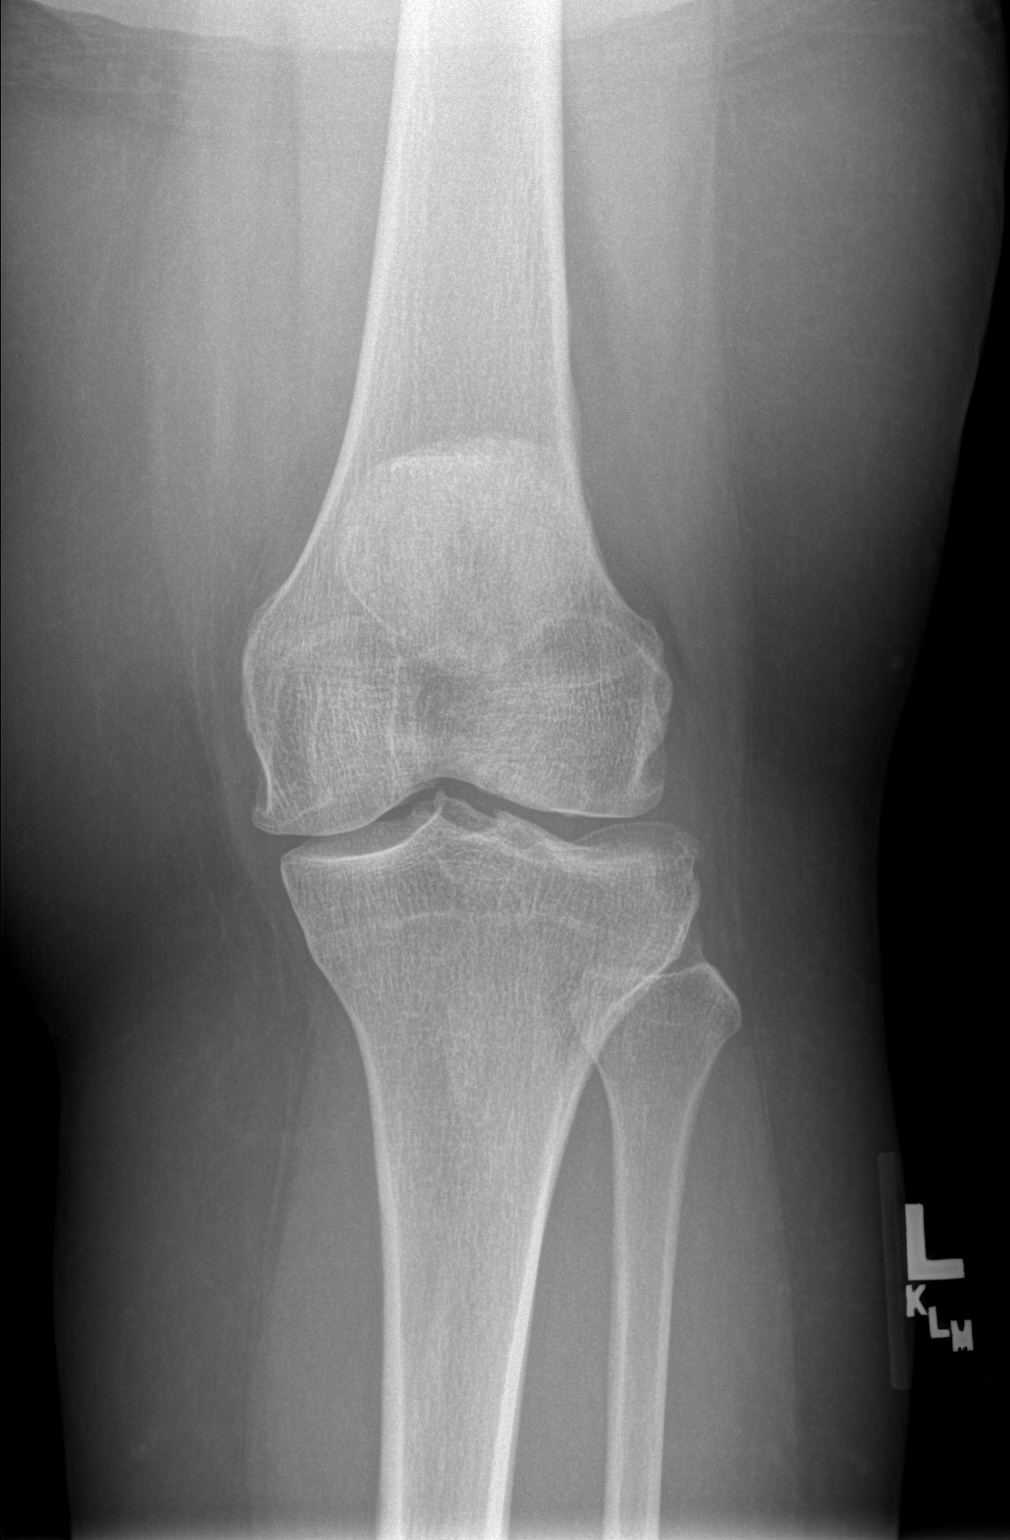

[w knee lat. left]
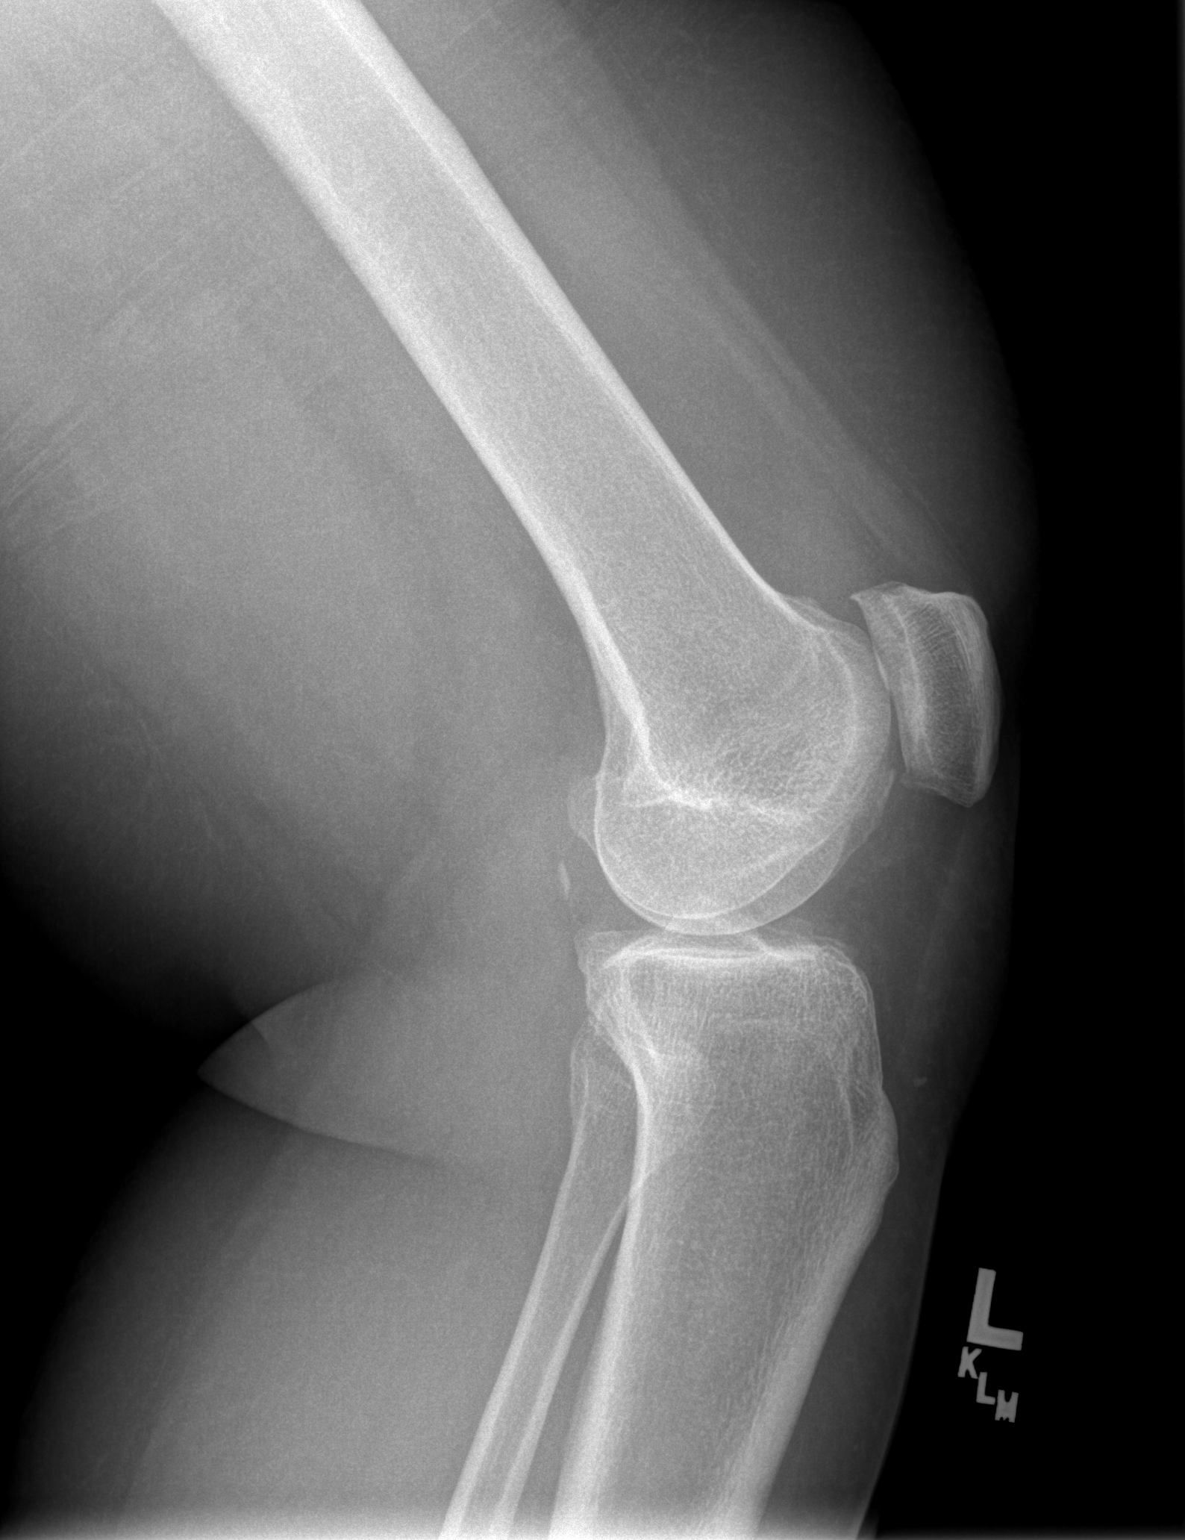

[2 of 2 positions shown; findings below may reference images not displayed]

FINDINGS: Frontal and lateral views were obtained.  There is no
fracture, dislocation, or effusion.  There is narrowing medially.
There is mild patellofemoral joint narrowing.  There is mild
spurring in all compartments.  No erosive change.
IMPRESSION: Osteoarthritic change with narrowing most notably medially.  No
fracture or effusion.

## 2012-09-07 IMAGING — CR DG KNEE 1-2V*R*
2 series · 2 of 2 positions shown · non-contrast
Comparison: None.

CLINICAL DATA: Pain and stiffness

RIGHT KNEE - 1-2 VIEW

[w knee ap right]
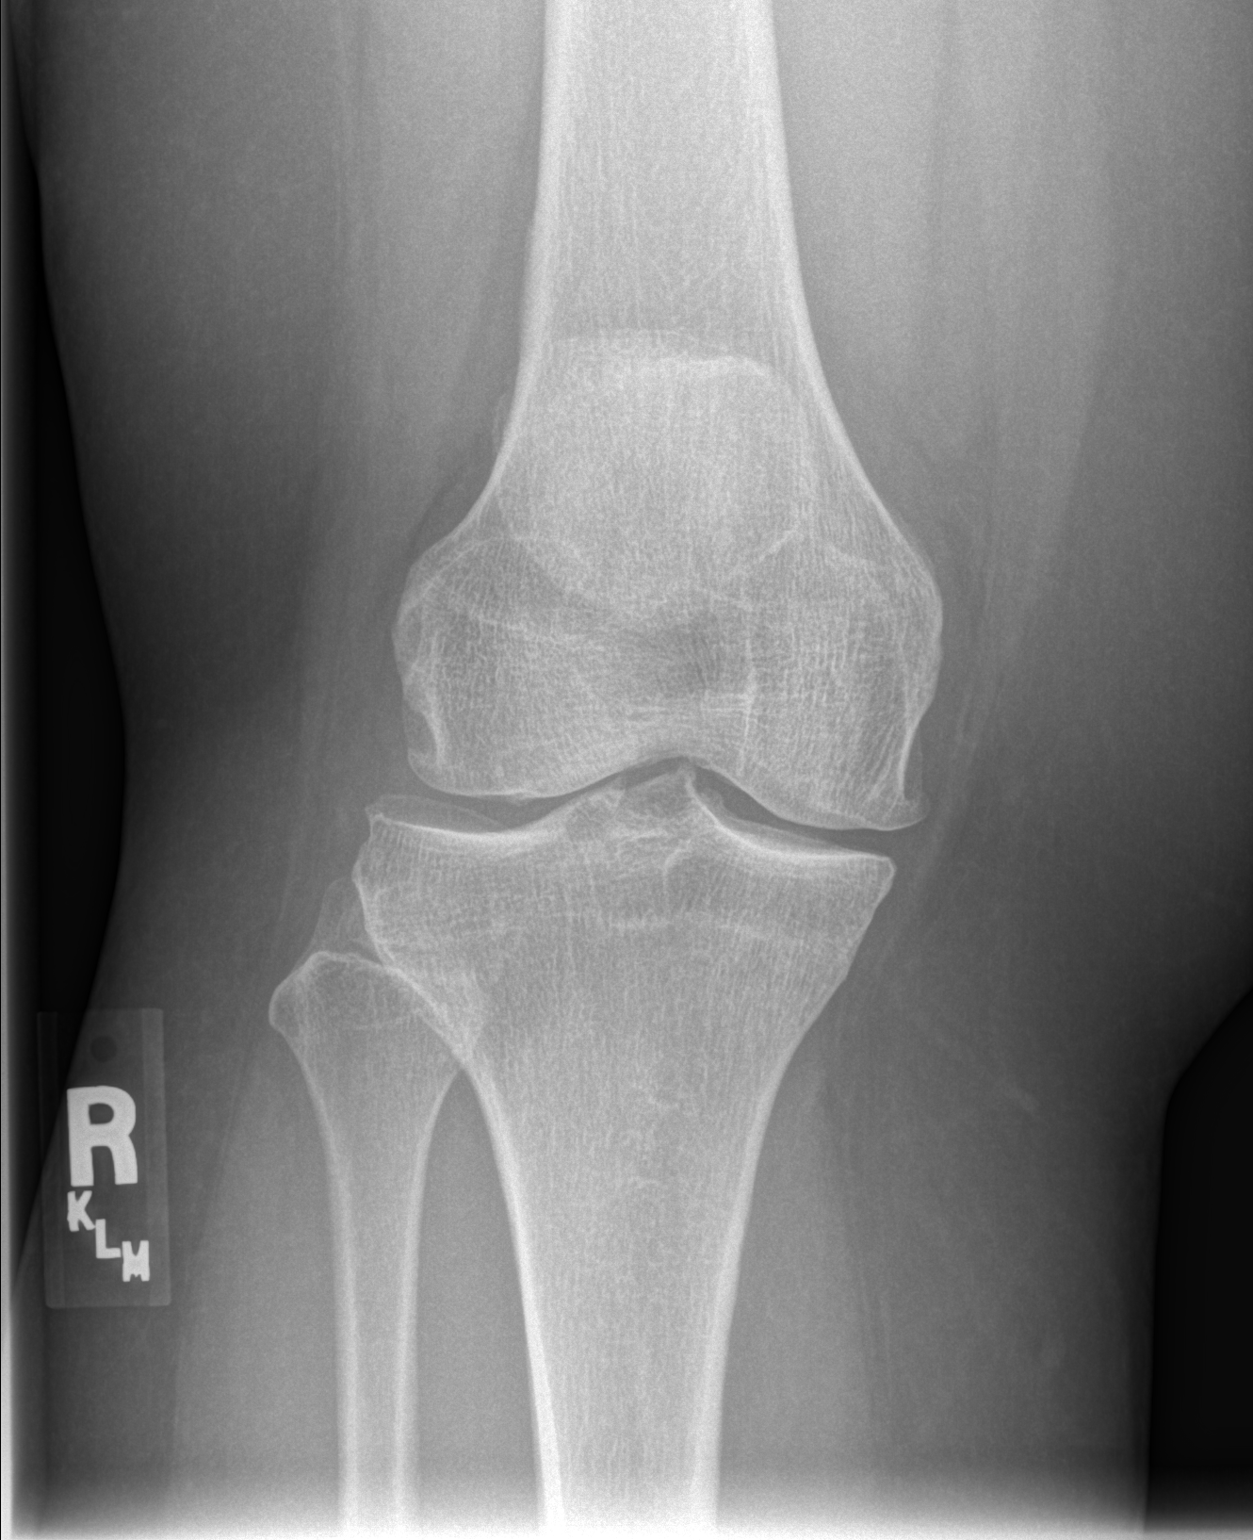

[w knee lat. right]
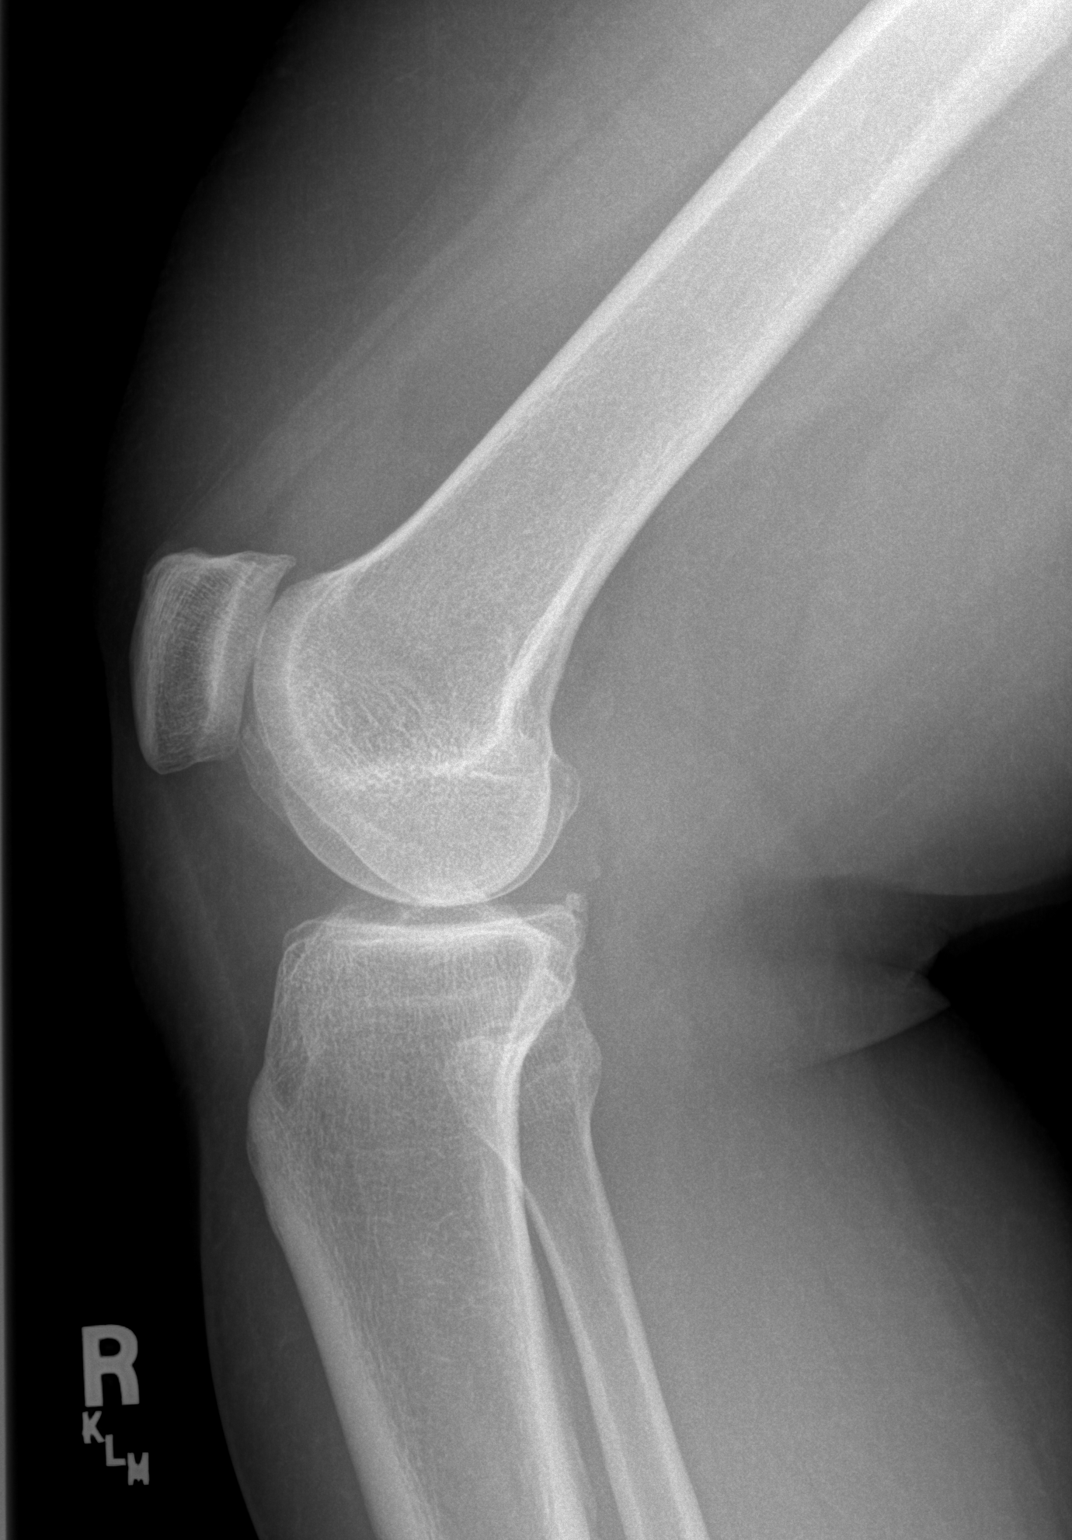

[2 of 2 positions shown; findings below may reference images not displayed]

FINDINGS: Frontal and lateral views were obtained.  There is no
fracture, dislocation, or effusion.  There is moderate narrowing
medially and in the patellofemoral joint region.  There is spurring
medially and arising from the superior posterior patella.  No
erosive change.
IMPRESSION: Osteoarthritic change.  No fracture or effusion.

## 2012-09-07 NOTE — Progress Notes (Signed)
PPD Reading Note PPD read and results entered in EpicCare. Result:  0 mm induration. Interpretation:negative If test not read within 48-72 hours of initial placement, patient advised to repeat in other arm 1-3 weeks after this test. Allergic reaction: no  Letter given regarding results Elizabeth Angelic Schnelle, RN-BSN  

## 2012-09-09 ENCOUNTER — Telehealth: Payer: Self-pay | Admitting: Family Medicine

## 2012-09-09 NOTE — Telephone Encounter (Signed)
Called patient to report results of knee xrays (bilat).  Discussed first-line therapy with knee strengthening and acetaminophen, followed by ibuprofen or other NSAID if pain worsens.  May consider course PT for strengthening if worsens.  JB

## 2012-10-06 ENCOUNTER — Telehealth: Payer: Self-pay | Admitting: Family Medicine

## 2012-10-06 NOTE — Telephone Encounter (Signed)
Patient is calling to verify the # of refills on her Metformin.  On 7/28, Dr. Mauricio Po sent in a Rx for Metformin with 6 refills, but she said that her bottle said that she had 0 refills.  I spoke with the pharmacist and confirmed that she does indeed have 6 refills and thinks that maybe she has an old bottle.  I called the patient back and let her know.  In this conversation, she mentioned possibly changing pharmacies and asked what she would need to do and where else do we send prescriptions to.  I let her know that we send Rx's to where ever she would like [place] to send to but if she does decide to change pharmacies, it would be up to her to contact her current pharmacy to have them transfer her Rx's to her new pharmacy.

## 2012-10-23 ENCOUNTER — Ambulatory Visit (INDEPENDENT_AMBULATORY_CARE_PROVIDER_SITE_OTHER): Payer: Medicaid Other | Admitting: Family Medicine

## 2012-10-23 ENCOUNTER — Encounter: Payer: Self-pay | Admitting: Family Medicine

## 2012-10-23 ENCOUNTER — Other Ambulatory Visit (HOSPITAL_COMMUNITY)
Admission: RE | Admit: 2012-10-23 | Discharge: 2012-10-23 | Disposition: A | Discharge status: Home / Self Care | Payer: Medicaid Other | Source: Ambulatory Visit | Attending: Family Medicine | Admitting: Family Medicine

## 2012-10-23 VITALS — BP 154/85 | HR 83 | Temp 98.6°F | Ht 64.0 in | Wt 192.4 lb

## 2012-10-23 DIAGNOSIS — N926 Irregular menstruation, unspecified: Secondary | ICD-10-CM

## 2012-10-23 DIAGNOSIS — E119 Type 2 diabetes mellitus without complications: Secondary | ICD-10-CM

## 2012-10-23 DIAGNOSIS — R300 Dysuria: Secondary | ICD-10-CM

## 2012-10-23 DIAGNOSIS — Z113 Encounter for screening for infections with a predominantly sexual mode of transmission: Secondary | ICD-10-CM | POA: Insufficient documentation

## 2012-10-23 LAB — POCT URINALYSIS DIPSTICK
Bilirubin, UA: NEGATIVE
Blood, UA: NEGATIVE
Glucose, UA: NEGATIVE
Nitrite, UA: NEGATIVE
Spec Grav, UA: >=1.03
Urobilinogen, UA: 0.2
pH, UA: 7

## 2012-10-23 LAB — POCT GLYCOSYLATED HEMOGLOBIN (HGB A1C): Hemoglobin A1C: 7.1

## 2012-10-23 LAB — POCT WET PREP (WET MOUNT): Clue Cells Wet Prep Whiff POC: POSITIVE

## 2012-10-23 LAB — POCT UA - MICROSCOPIC ONLY

## 2012-10-23 LAB — POCT URINE PREGNANCY: Preg Test, Ur: NEGATIVE

## 2012-10-23 MED ORDER — METRONIDAZOLE 500 MG PO TABS: 500.0000 mg | ORAL_TABLET | Freq: Two times a day (BID) | ORAL | Status: DC

## 2012-10-23 NOTE — Assessment & Plan Note (Signed)
Well controlled, continue current regimen. Discussed results of A1C with patient.

## 2012-10-23 NOTE — Progress Notes (Signed)
  Subjective:    Patient ID: Colleen Ford, female    DOB: [birth year], 47 y.o.   MRN: [id number]  HPI Patient here for complaint of lower pelvic pain, sharp, as well as urinary urgency/frequency and sensation of incomplete void over the past 1 week. Earlier this week (aroudn 9/17) saw some red blood when she went to urinate.  May have had some mild right-sided low back pain at that time as well.  No fevers or chills.  No vaginal discharge or pain with intercourse.  Same female sexual partner since May 2014, reports it's a healthy relationship for her.    Of note, no history of renal calculi.  LMP in June, 2014.  We reviewed recent lab workup for irregular menses, including FSH that does not indicate perimenopause. Also for follow up of her DM; her A1C at last check was 7.2%, and it is 7.1% today.  She brings in her meter today; readings at home (usually 5-9pm): 152, 149, 136, 161, 148, 147, 146, 141, 144   Review of SystemsSee above     Objective:   Physical Exam Well appearing, no apparent distress HEENT Neck supple. No cervical adenopathy.  ABD Soft, nontender, nondistended. No CVA tenderness. No organomegaly.  GYN: Normal vaginal mucosa.  White creamy vaginal discharge; no bleeding or discharge from os.  No CMT or adnexal tenderness/mass. FEET: monofilament sensation intact in all areas of both feet. No skin lesions; good dp pulses bilaterally.        Assessment & Plan:

## 2012-10-27 ENCOUNTER — Encounter: Payer: Self-pay | Admitting: Family Medicine

## 2012-11-10 ENCOUNTER — Telehealth: Payer: Self-pay | Admitting: Family Medicine

## 2012-11-10 NOTE — Telephone Encounter (Signed)
Patient recently finished a round of antibiotics. Since finishing meds, patient has had diarrhea, which caused her anal region to become raw & sore. Also, she feels she maybe having symptoms of a yeast infection. Patient needs advice on what to do next.

## 2012-11-10 NOTE — Telephone Encounter (Signed)
Will fwd. To Dr.Breen for advise/review. Thanks. Lorenda Hatchet, Renato Battles

## 2012-11-11 MED ORDER — FLUCONAZOLE 150 MG PO TABS: 150.0000 mg | ORAL_TABLET | Freq: Once | ORAL | Status: DC

## 2012-11-11 NOTE — Telephone Encounter (Signed)
Pt given message from MD.  Pt verbalized understanding and is agreeable.  Shani Fitch, Darlyne Russian, CMA

## 2012-11-11 NOTE — Telephone Encounter (Signed)
Diflucan 150mg  tablet, take 1 tablet once, sent to [facility].  She may use otc topical anusol for irritation.  If not better, or if worse, to schedule office visit.  I am out of office today, please relay message to patient.  JB

## 2012-12-10 ENCOUNTER — Other Ambulatory Visit: Payer: Self-pay

## 2012-12-28 ENCOUNTER — Other Ambulatory Visit: Payer: Self-pay | Admitting: Family Medicine

## 2012-12-28 NOTE — Telephone Encounter (Signed)
Will fwd to MD.  Arin Vanosdol L, CMA  

## 2012-12-28 NOTE — Telephone Encounter (Signed)
Patient calls for refill on Lisinopril. Has made an appt with Dr. Mauricio Po for 12/9 at 11am.

## 2013-01-12 ENCOUNTER — Encounter: Payer: Self-pay | Admitting: Family Medicine

## 2013-01-12 ENCOUNTER — Ambulatory Visit (INDEPENDENT_AMBULATORY_CARE_PROVIDER_SITE_OTHER): Payer: Medicaid Other | Admitting: Family Medicine

## 2013-01-12 VITALS — BP 123/82 | HR 90 | Ht 64.0 in | Wt 195.0 lb

## 2013-01-12 DIAGNOSIS — E119 Type 2 diabetes mellitus without complications: Secondary | ICD-10-CM

## 2013-01-12 DIAGNOSIS — Z23 Encounter for immunization: Secondary | ICD-10-CM

## 2013-01-12 LAB — LIPID PANEL
Cholesterol: 213 mg/dL — ABNORMAL HIGH (ref 0–200)
HDL: 86 mg/dL (ref >39)
LDL Cholesterol: 111 mg/dL — ABNORMAL HIGH (ref 0–99)
Total CHOL/HDL Ratio: 2.5 Ratio
Triglycerides: 78 mg/dL (ref <150)
VLDL: 16 mg/dL (ref 0–40)

## 2013-01-12 LAB — POCT GLYCOSYLATED HEMOGLOBIN (HGB A1C): Hemoglobin A1C: 6.5

## 2013-01-12 LAB — COMPREHENSIVE METABOLIC PANEL
ALT: 8 U/L (ref 0–35)
AST: 13 U/L (ref 0–37)
Albumin: 4.1 g/dL (ref 3.5–5.2)
Alkaline Phosphatase: 49 U/L (ref 39–117)
BUN: 13 mg/dL (ref 6–23)
CO2: 26 mEq/L (ref 19–32)
Calcium: 9.2 mg/dL (ref 8.4–10.5)
Chloride: 99 mEq/L (ref 96–112)
Creat: 0.7 mg/dL (ref 0.50–1.10)
Glucose, Bld: 114 mg/dL — ABNORMAL HIGH (ref 70–99)
Potassium: 3.6 mEq/L (ref 3.5–5.3)
Sodium: 138 mEq/L (ref 135–145)
Total Bilirubin: 0.4 mg/dL (ref 0.3–1.2)
Total Protein: 8 g/dL (ref 6.0–8.3)

## 2013-01-12 MED ORDER — ATORVASTATIN CALCIUM 20 MG PO TABS: 20.0000 mg | ORAL_TABLET | Freq: Every day | ORAL | Status: DC

## 2013-01-12 MED ORDER — ASPIRIN EC 81 MG PO TBEC: 81.0000 mg | DELAYED_RELEASE_TABLET | Freq: Every day | ORAL | Status: DC

## 2013-01-12 NOTE — Patient Instructions (Signed)
It was nice to see you today Colleen Ford.  Thanks for being so on top of your care!  -As discussed, we have started you on Atorvastatin 20 mg and have sent this to your pharmacy. -We have given you the flu shot and Pneumovax shots. -We have taken a urine sample to check for protein and obtained blood for lipid and liver function tests, we will contact with these results. -Plan to follow up in 3 months.

## 2013-01-12 NOTE — Assessment & Plan Note (Signed)
Patient with well controlled diabetes; taking baby aspirin in addition to her metformin 500mg  twice daily, and lisinopril/HCTZ.  MIcroalbumin done today.  Discussed her 10-year Framingham CV risk calculator of 6.6%; the 2013 AHA/ACC calculator gives a 10-year risk of CV event at 2.2%.  Discussed likely risk reduction of 20-30% with statin, which may translate into a 2% risk reduction at this time. She has never been on a statin before.  To try intermediate statin dose, checking fasting lipid panel today for baseline.  May consider recheck in 6-12 months; discussed possible side effects and instructions to stop and call my office if she experiences any adverse effects that she believes are related to the medicine.  Follow up in 3 months.

## 2013-01-12 NOTE — Progress Notes (Signed)
Patient ID: Colleen Ford, female   DOB: Nov 12, 1965, 48 y.o.   MRN: [id number] Colleen Ford is a 47 y.o. female who presents to [facility] today for follow up. Attending Note; Patient seen and examined by me today in conjunction with Demetrios Loll, [facility] MS3 and I agree with his assessment and plan as documented, my additions in bold type. JB The patient reports having some numbness in her lateral R thigh about 1-2 weeks ago.  She experienced some itching and then felt numbness for 20-30 minutes which then resolved.  She reports no other major symptoms and denied any muscle weakness, tingling or rash. Colleen Ford reports she has been feeling well.  Has been following with My Chart app to see her progress on health maintenance.  Requests microalbumin check and pneumovax, flu vaccine.  Denies chest pain, shortness of breath, vaginal discharge, vision changes.  JB The following portions of the patient's history were reviewed and updated as appropriate: allergies, current medications, past medical history, family and social history, and problem list.  Patient is a nonsmoker.   No past medical history on file.  ROS as above, and she endorses generalized tenderness to touch all over, intermittent chills.  Denies any chest pain, SOB, numbness/tingling in extremities.    Medications reviewed. Current Outpatient Prescriptions  Medication Sig Dispense Refill  . ACCU-CHEK FASTCLIX LANCETS MISC 1 Units by Does not apply route daily.  102 each  4  . Blood Glucose Monitoring Suppl (ACCU-CHEK NANO SMARTVIEW) W/DEVICE KIT 1 Device by Does not apply route as directed.  1 kit  0  . glucose blood (ACCU-CHEK SMARTVIEW) test strip 1 each by Other route daily. Use as instructed  100 each  12  . lisinopril-hydrochlorothiazide (PRINZIDE,ZESTORETIC) 20-12.5 MG per tablet TAKE TWO TABLETS BY MOUTH ONCE DAILY  60 tablet  0  . metFORMIN (GLUCOPHAGE) 500 MG tablet TAKE ONE TABLET BY MOUTH TWICE DAILY WITH A MEAL  60 tablet  6  .  aspirin EC 81 MG tablet Take 1 tablet (81 mg total) by mouth daily.      Marland Kitchen atorvastatin (LIPITOR) 20 MG tablet Take 1 tablet (20 mg total) by mouth daily.  30 tablet  6  . fluconazole (DIFLUCAN) 150 MG tablet Take 1 tablet (150 mg total) by mouth once.  1 tablet  0  . metroNIDAZOLE (FLAGYL) 500 MG tablet Take 1 tablet (500 mg total) by mouth 2 (two) times daily.  14 tablet  0  . triamcinolone (KENALOG) 0.5 % ointment Apply to affected area two times daily until symptoms resolve.  30 g  0   No current facility-administered medications for this visit.   Exam: Well appearing, no apparent distress. HEENT Neck supple, no cervical adenopathy.  COR: Regular S1S2, no extra sounds PULM Clear bilaterally, no rales or wheezes.  JB BP 123/82  Pulse 90  Ht 5\' 4"  (1.626 m)  Wt 195 lb (88.451 kg)  BMI 33.46 kg/m2  LMP 12/12/2012 Gen: Well NAD HEENT: EOMI,  MMM Lungs: CTABL, normal WOB Heart: RRR Ext: Non edematous BL  LE, warm and well perfused. -Normal strength and sensation bilaterally in lower extremities  -Mild tenderness to palpation along lateral R thigh and in bilateral knees  -Monofilament sensation intact bilaterally No maceration of skin between toes; monofilament testing with intact sensation in all areas of both feet. No edema.  Palpable dp pulses bilaterally. JB  No results found for this or any previous visit (from the past 72 hour(s)).  Assessment Colleen Ford is  a 48 y.o. female who is being followed for HTN and DM, both of which have been well controlled.  Per discussion with the patient we have decided to start her on a statin drug to help manage her CV risks.  The patient does not have joint or MSK pain to a level that she requires management.  Plan CV/HTN: -Continue lisinopril-HCTZ and daily aspirin -Obtained lipid panel and LFTs, will contact patient with results -Started Atorvastatin 20 mg daily  DM: -Obtained urine microalbumin and A1C, will contact patient with  results -Continue metformin  Health Maintenance: -Given flu shot and Pneumovax shot today -Plan follow up in 3 months

## 2013-01-13 LAB — MICROALBUMIN / CREATININE URINE RATIO
Creatinine, Urine: 197.7 mg/dL
Microalb Creat Ratio: 2.7 mg/g (ref 0.0–30.0)
Microalb, Ur: 0.54 mg/dL (ref 0.00–1.89)

## 2013-01-14 ENCOUNTER — Encounter: Payer: Self-pay | Admitting: Family Medicine

## 2013-02-17 ENCOUNTER — Encounter: Payer: Self-pay | Admitting: Family Medicine

## 2013-02-18 ENCOUNTER — Telehealth: Payer: Self-pay | Admitting: Family Medicine

## 2013-02-18 MED ORDER — LISINOPRIL-HYDROCHLOROTHIAZIDE 20-12.5 MG PO TABS: ORAL_TABLET | ORAL | Status: DC

## 2013-02-18 NOTE — Telephone Encounter (Signed)
Will fwd. To Dr.Breen for refill. Colleen Ford, [name]

## 2013-02-18 NOTE — Telephone Encounter (Signed)
Pt called and would like a refill on her lisinopril and she needs it sent to [facility] in [place] [facility]. jw

## 2013-03-02 ENCOUNTER — Encounter: Payer: Self-pay | Admitting: Family Medicine

## 2013-03-02 ENCOUNTER — Ambulatory Visit (INDEPENDENT_AMBULATORY_CARE_PROVIDER_SITE_OTHER): Payer: Medicaid Other | Admitting: Family Medicine

## 2013-03-02 VITALS — BP 129/86 | HR 84 | Ht 64.0 in | Wt 198.0 lb

## 2013-03-02 DIAGNOSIS — G5600 Carpal tunnel syndrome, unspecified upper limb: Secondary | ICD-10-CM | POA: Insufficient documentation

## 2013-03-02 MED ORDER — WRIST SPLINT/COCK-UP/LEFT M MISC: 1.0000 [IU] | Freq: Every day | Status: DC

## 2013-03-02 MED ORDER — WRIST SPLINT/COCK-UP/RIGHT M MISC: 1.0000 [IU] | Freq: Every day | Status: DC

## 2013-03-02 NOTE — Patient Instructions (Signed)
It was a pleasure to see you today.  I believe you have carpal tunnel syndrome in both wrists.  For this reason, I sent prescriptions for COCK-UP SPLINTS to be put on both wrists at bedtime in a slightly extended position, remove when you wake up.  You may take 2 to 3 tablets of IBUPROFEN 200mg  each, (total 400 to 600mg ) with meals, three times daily for the coming 5 to 10 days.   If not better, please call me back and we can consider if we need to change our approach.   Carpal Tunnel Syndrome The carpal tunnel is a narrow area located on the palm side of your wrist. The tunnel is formed by the wrist bones and ligaments. Nerves, blood vessels, and tendons pass through the carpal tunnel. Repeated wrist motion or certain diseases may cause swelling within the tunnel. This swelling pinches the main nerve in the wrist (median nerve) and causes the painful hand and arm condition called carpal tunnel syndrome. CAUSES   Repeated wrist motions.  Wrist injuries.  Certain diseases like arthritis, diabetes, alcoholism, hyperthyroidism, and kidney failure.  Obesity.  Pregnancy. SYMPTOMS   A "pins and needles" feeling in your fingers or hand.  Tingling or numbness in your fingers or hand.  An aching feeling in your entire arm.  Wrist pain that goes up your arm to your shoulder.  Pain that goes down into your palm or fingers.  A weak feeling in your hands. DIAGNOSIS  Your caregiver will take your history and perform a physical exam. An electromyography test may be needed. This test measures electrical signals sent out by the muscles. The electrical signals are usually slowed by carpal tunnel syndrome. You may also need X-rays. TREATMENT  Carpal tunnel syndrome may clear up by itself. Your caregiver may recommend a wrist splint or medicine such as a nonsteroidal anti-inflammatory medicine. Cortisone injections may help. Sometimes, surgery may be needed to free the pinched nerve.  HOME CARE  INSTRUCTIONS   Take all medicine as directed by your caregiver. Only take over-the-counter or prescription medicines for pain, discomfort, or fever as directed by your caregiver.  If you were given a splint to keep your wrist from bending, wear it as directed. It is important to wear the splint at night. Wear the splint for as long as you have pain or numbness in your hand, arm, or wrist. This may take 1 to 2 months.  Rest your wrist from any activity that may be causing your pain. If your symptoms are work-related, you may need to talk to your employer about changing to a job that does not require using your wrist.  Put ice on your wrist after long periods of wrist activity.  Put ice in a plastic bag.  Place a towel between your skin and the bag.  Leave the ice on for 15-20 minutes, 03-04 times a day.  Keep all follow-up visits as directed by your caregiver. This includes any orthopedic referrals, physical therapy, and rehabilitation. Any delay in getting necessary care could result in a delay or failure of your condition to heal. SEEK IMMEDIATE MEDICAL CARE IF:   You have new, unexplained symptoms.  Your symptoms get worse and are not helped or controlled with medicines. MAKE SURE YOU:   Understand these instructions.  Will watch your condition.  Will get help right away if you are not doing well or get worse. Document Released: 01/19/2000 Document Revised: 04/15/2011 Document Reviewed: 12/07/2010 ExitCare Patient Information 2014 [employer].  

## 2013-03-03 NOTE — Progress Notes (Signed)
   Subjective:    Patient ID: Colleen Ford, female    DOB: [birth year], 48 y.o.   MRN: [id number]  HPI Colleen Ford comes in today for complaint of bilateral hand/wrist discomfort, with tingling in fingers 1 through 4 of both hands.  Worse in R than L.  Going on for the past 2 weeks.  Often feels her fingers are numb.  Had carpal tunnel syndrome in 1996 which resolved.   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  She is right-hand dominant.  No recent or distant wrist/hand trauma.    Review of Systems No fevers or chills, no cough.    Objective:   Physical Exam Well appearing, no apparent distress MSK: Bilateral hand grip full and symmetric. Brisk capillary refill in both hands/fingertips.  Palpable radial pulses in both wrists. Grossly diminished sensation in fingertips 1-4 of both hands. Full ROM wrists bilat.  POSITIVE PHALENS TEST IN BOTH WRISTS> Negative Tinel test bilat.        Assessment & Plan:

## 2013-03-03 NOTE — Assessment & Plan Note (Signed)
Exam consistent with bilateral mild carpal tunnel syndrome. For cock-up splint usage during sleep, NSAID use temporarily for the coming week, and follow up if not markedly improved.

## 2013-03-19 ENCOUNTER — Other Ambulatory Visit: Payer: Self-pay

## 2013-03-19 DIAGNOSIS — Z1231 Encounter for screening mammogram for malignant neoplasm of breast: Secondary | ICD-10-CM

## 2013-04-02 ENCOUNTER — Ambulatory Visit: Payer: Medicaid Other

## 2013-04-23 ENCOUNTER — Ambulatory Visit
Admission: RE | Admit: 2013-04-23 | Discharge: 2013-04-23 | Disposition: A | Discharge status: Home / Self Care | Payer: Medicaid Other | Source: Ambulatory Visit

## 2013-04-23 DIAGNOSIS — Z1231 Encounter for screening mammogram for malignant neoplasm of breast: Secondary | ICD-10-CM

## 2013-06-04 ENCOUNTER — Encounter: Payer: Self-pay | Admitting: Family Medicine

## 2013-06-04 ENCOUNTER — Ambulatory Visit (INDEPENDENT_AMBULATORY_CARE_PROVIDER_SITE_OTHER): Payer: Medicaid Other | Admitting: Family Medicine

## 2013-06-04 VITALS — BP 150/100 | HR 80 | Temp 98.1°F | Wt 208.7 lb

## 2013-06-04 DIAGNOSIS — T783XXA Angioneurotic edema, initial encounter: Secondary | ICD-10-CM

## 2013-06-04 DIAGNOSIS — I10 Essential (primary) hypertension: Secondary | ICD-10-CM

## 2013-06-04 MED ORDER — AMLODIPINE BESYLATE 5 MG PO TABS: 5.0000 mg | ORAL_TABLET | Freq: Every day | ORAL | Status: DC

## 2013-06-04 NOTE — Progress Notes (Signed)
   Subjective:    Patient ID: Colleen Ford, female    DOB: [birth year], 48 y.o.   MRN: [id number]  HPI Patient here for SDA for complaint of swelling of upper lip.  The most recent episode started on Friday, April 24th after using a new toothbrush.  She reports several less severe episodes of upper lip swelling in the past 9 months or so.  Unable to associate the episodes with new medications or OTC supplements, no new foods or travel, no shellfish or tree nuts. In the most recent episode the swelling began over the R side of the upper lip; accompanied by a white shallow ulcer under the lip.  Did not effect the gums.  She has not had fevers, chills, sore throat, skin rashes.  She has noticed gradual improvement in the symptoms without any direct intervention. Is remarkably better now than it was one week ago.   Patient is on lisinopril/HCTZ for blood pressure control in setting of DM.  She has had some mild dry cough which she thinks predates her use of lisinopril.  Review of SystemsSee HPI      Objective:   Physical Exam Well appearing, no apparent distress HEENT Neck supple, no cervical adenopathy. Very mild edema without surrounding erythema of R side of upper lip.  No mucosal lesions or gingival lesions noted. Moist mucus membranes. Clear oropharynx. Tongue and soft palate symmetrical.  COR Regular S1S2 PULM clear bilaterally, no rales or wheezes.        Assessment & Plan:

## 2013-06-04 NOTE — Assessment & Plan Note (Signed)
Given diagnosis of angioedema, to change BP med to amlodipine.  Recheck BP with RN visit in the coming 2-3 weeks; if higher than 140/90, then to increase dose of CCB.

## 2013-06-04 NOTE — Patient Instructions (Signed)
It was a pleasure to see you.  I believe your lip swelling and cough are related to the lisinopril medicine. I have listed the lisinopril as an allergy in your chart.   I ask that you stop the lisinopril/HCTZ.  START THE NEW MEDICINE AMLODIPINE 5mg  one tablet every morning.   Nurse visit to check blood pressure in the coming 2 to 4 weeks; if controlled (<140/90) we will stay with the 5mg  dose.  If you do not tolerate the medicine, I would like you to call me (and hold the medicine).   Follow up with Dr Lindell Noe in 4 to 6 weeks.

## 2013-07-02 ENCOUNTER — Ambulatory Visit (INDEPENDENT_AMBULATORY_CARE_PROVIDER_SITE_OTHER): Payer: Medicaid Other | Admitting: Family Medicine

## 2013-07-02 ENCOUNTER — Encounter: Payer: Self-pay | Admitting: Family Medicine

## 2013-07-02 VITALS — BP 138/83 | HR 78 | Temp 98.2°F | Ht 64.0 in | Wt 206.0 lb

## 2013-07-02 DIAGNOSIS — J209 Acute bronchitis, unspecified: Secondary | ICD-10-CM

## 2013-07-02 DIAGNOSIS — T783XXA Angioneurotic edema, initial encounter: Secondary | ICD-10-CM

## 2013-07-02 DIAGNOSIS — E119 Type 2 diabetes mellitus without complications: Secondary | ICD-10-CM

## 2013-07-02 DIAGNOSIS — I10 Essential (primary) hypertension: Secondary | ICD-10-CM

## 2013-07-02 LAB — POCT GLYCOSYLATED HEMOGLOBIN (HGB A1C): Hemoglobin A1C: 7.6

## 2013-07-02 MED ORDER — AZITHROMYCIN 250 MG PO TABS: ORAL_TABLET | ORAL | Status: DC

## 2013-07-02 MED ORDER — GUAIFENESIN ER 600 MG PO TB12: 1200.0000 mg | ORAL_TABLET | Freq: Two times a day (BID) | ORAL | Status: DC

## 2013-07-02 NOTE — Assessment & Plan Note (Signed)
Has not had recurrence of this since discontinuing the ACEI.

## 2013-07-02 NOTE — Progress Notes (Signed)
   Subjective:    Patient ID: Colleen Ford, female    DOB: [birth year], 48 y.o.   MRN: [id number]  HPI Patient here for follow up on DM.  She reports that she has not been as consistent in taking her metformin as she had been previously.  A1C is up today (7.6%).   She has not had any more lip swelling since stopping the lisinopril and switching to amlodipine.  She does note some ankle swelling bilaterally, not so bothersome as to want to stop the medicine.   Complaint of cough with phlegm production, has been ongoing since March (2-3 months).  Congested when she wakes up. No real nasal congestion or symptoms. Cough started as dry cough, and then became productive more recently. Has fits of cough when she wakes up, not awakening her from sleep. Has taken Claritin for one week and did not notice any improvement in the cough.     Review of Systems     Objective:   Physical Exam Generally well appearing, no apparent distress HEENT Neck supple, no cervical adenopathy. Normal nasal mucosa. TMs clear. No frontal or maxillary sinus tenderness. Clear oropharynx.  COR regular S1S2 PULM Clear bilaterally, no rales or wheezes. Some paroxsyms of cough during exam.  EXTS: 1+ bilateral ankle edema with palpable dp pulses bilaterally.        Assessment & Plan:

## 2013-07-02 NOTE — Assessment & Plan Note (Signed)
2-3 months chronic coughing paroxysms, normal lung exam and no relief with antihistamines (no evidence of allergic rhinitis on exam(. Treatment with macrolide and follow up if not improved.

## 2013-07-02 NOTE — Patient Instructions (Signed)
It was a pleasure to see you today.  I am treating you for a presumed bacterial bronchitis, based on your exam and the length of time you have been ill.   I sent in two prescriptions: AZITHROMYCIN 250mg  tablets, (antibiotic), take 2 tablets on the first day, then 1 tablet/daily for the next 4 days.   Guaifenesin (active ingredient in Mucinex), 600mg  tablets, take 2 tablets every 12 hours by mouth.   I would like to see you back in 2 weeks for follow up.   For diabetes, your A1C is higher today than before (today 7.6%, previously 6.5%). tRY TO TAKE THE MEDICINE AS DIRECTED TWICE DAILY. IF IT DOES NOT IMPROVE, I would like to increase your metformin 500mg  to 2 tablets with breakfast and 1 tablet in the evening.  We will recheck the A1C in 3 months.   FOLLOW UP IN [place] Lindell Noe

## 2013-07-02 NOTE — Assessment & Plan Note (Signed)
Well controlled BP, some mild edema of ankles but is well tolerated. Continue with Norvasc for now.

## 2013-07-20 ENCOUNTER — Encounter: Payer: Self-pay | Admitting: Family Medicine

## 2013-07-20 ENCOUNTER — Ambulatory Visit (INDEPENDENT_AMBULATORY_CARE_PROVIDER_SITE_OTHER): Payer: Medicaid Other | Admitting: Family Medicine

## 2013-07-20 VITALS — BP 136/88 | HR 76 | Ht 64.0 in | Wt 209.0 lb

## 2013-07-20 DIAGNOSIS — E119 Type 2 diabetes mellitus without complications: Secondary | ICD-10-CM

## 2013-07-20 DIAGNOSIS — R3589 Other polyuria: Secondary | ICD-10-CM

## 2013-07-20 DIAGNOSIS — I10 Essential (primary) hypertension: Secondary | ICD-10-CM

## 2013-07-20 DIAGNOSIS — R358 Other polyuria: Secondary | ICD-10-CM

## 2013-07-20 LAB — POCT URINALYSIS DIPSTICK
Bilirubin, UA: NEGATIVE
Blood, UA: NEGATIVE
Glucose, UA: NEGATIVE
Ketones, UA: NEGATIVE
Nitrite, UA: NEGATIVE
Protein, UA: NEGATIVE
Spec Grav, UA: 1.02
Urobilinogen, UA: 0.2
pH, UA: 7.5

## 2013-07-20 LAB — POCT UA - MICROSCOPIC ONLY

## 2013-07-20 LAB — POCT URINE PREGNANCY: Preg Test, Ur: NEGATIVE

## 2013-07-20 NOTE — Patient Instructions (Signed)
It was a pleasure to see you today. Your blood pressure is well controlled today (138/88).  I would like to see you back in 2 months (mid-August) for a recheck of your hemoglobin A1C and blood pressure.   PLEASE MAKE APPT WITH DR Lindell Noe IN MID-AUGUST FOR FOLLOW UP DM AND HTN.  2 Gram Low Sodium Diet A 2 gram sodium diet restricts the amount of sodium in the diet to no more than 2 g or 2000 mg daily. Limiting the amount of sodium is often used to help lower blood pressure. It is important if you have heart, liver, or kidney problems. Many foods contain sodium for flavor and sometimes as a preservative. When the amount of sodium in a diet needs to be low, it is important to know what to look for when choosing foods and drinks. The following includes some information and guidelines to help make it easier for you to adapt to a low sodium diet. QUICK TIPS  Do not add salt to food.  Avoid convenience items and fast food.  Choose unsalted snack foods.  Buy lower sodium products, often labeled as "lower sodium" or "no salt added."  Check food labels to learn how much sodium is in 1 serving.  When eating at a restaurant, ask that your food be prepared with less salt or none, if possible. READING FOOD LABELS FOR SODIUM INFORMATION The nutrition facts label is a good place to find how much sodium is in foods. Look for products with no more than 500 to 600 mg of sodium per meal and no more than 150 mg per serving. Remember that 2 g = 2000 mg. The food label may also list foods as:  Sodium-free: Less than 5 mg in a serving.  Very low sodium: 35 mg or less in a serving.  Low-sodium: 140 mg or less in a serving.  Light in sodium: 50% less sodium in a serving. For example, if a food that usually has 300 mg of sodium is changed to become light in sodium, it will have 150 mg of sodium.  Reduced sodium: 25% less sodium in a serving. For example, if a food that usually has 400 mg of sodium is changed to  reduced sodium, it will have 300 mg of sodium. CHOOSING FOODS Grains  Avoid: Salted crackers and snack items. Some cereals, including instant hot cereals. Bread stuffing and biscuit mixes. Seasoned rice or pasta mixes.  Choose: Unsalted snack items. Low-sodium cereals, oats, puffed wheat and rice, shredded wheat. English muffins and bread. Pasta. Meats  Avoid: Salted, canned, smoked, spiced, pickled meats, including fish and poultry. Bacon, ham, sausage, cold cuts, hot dogs, anchovies.  Choose: Low-sodium canned tuna and salmon. Fresh or frozen meat, poultry, and fish. Dairy  Avoid: Processed cheese and spreads. Cottage cheese. Buttermilk and condensed milk. Regular cheese.  Choose: Milk. Low-sodium cottage cheese. Yogurt. Sour cream. Low-sodium cheese. Fruits and Vegetables  Avoid: Regular canned vegetables. Regular canned tomato sauce and paste. Frozen vegetables in sauces. Olives. Angie Fava. Relishes. Sauerkraut.  Choose: Low-sodium canned vegetables. Low-sodium tomato sauce and paste. Frozen or fresh vegetables. Fresh and frozen fruit. Condiments  Avoid: Canned and packaged gravies. Worcestershire sauce. Tartar sauce. Barbecue sauce. Soy sauce. Steak sauce. Ketchup. Onion, garlic, and table salt. Meat flavorings and tenderizers.  Choose: Fresh and dried herbs and spices. Low-sodium varieties of mustard and ketchup. Lemon juice. Tabasco sauce. Horseradish. SAMPLE 2 GRAM SODIUM MEAL PLAN Breakfast / Sodium (mg)  1 cup low-fat milk / 143  mg  2 slices whole-wheat toast / 270 mg  1 tbs heart-healthy margarine / 153 mg  1 hard-boiled egg / 139 mg  1 small orange / 0 mg Lunch / Sodium (mg)  1 cup raw carrots / 76 mg   cup hummus / 298 mg  1 cup low-fat milk / 143 mg   cup red grapes / 2 mg  1 whole-wheat pita bread / 356 mg Dinner / Sodium (mg)  1 cup whole-wheat pasta / 2 mg  1 cup low-sodium tomato sauce / 73 mg  3 oz lean ground beef / 57 mg  1 small side  salad (1 cup raw spinach leaves,  cup cucumber,  cup yellow bell pepper) with 1 tsp olive oil and 1 tsp red wine vinegar / 25 mg Snack / Sodium (mg)  1 container low-fat vanilla yogurt / 107 mg  3 graham cracker squares / 127 mg Nutrient Analysis  Calories: 2033  Protein: 77 g  Carbohydrate: 282 g  Fat: 72 g  Sodium: 1971 mg Document Released: 01/21/2005 Document Revised: 04/15/2011 Document Reviewed: 04/24/2009 [employer] Patient Information 2014 [place], [place].

## 2013-07-21 NOTE — Progress Notes (Signed)
   Subjective:    Patient ID: Colleen Ford, female    DOB: 1965-10-27, 48 y.o.   MRN: [id number]  HPI Patient here for follow up. She reports complete resolution of the cough with the recent course of azithromycin.   Has good blood pressure control with addition of CCB (amlodipine); does have bilateral leg edema that started after initiation of the medicine.  She says it is mildly bothersome but not to the degree that would make her want to d/c treatment.  In reviewing her history, she has had no further episodes of lip swelling/angioedema since stopping the lisinopril.    Recent dilated eye exam showed no evidence of DM retinopathy.  CBGs at home have been in the mid-100s.  She reports some polyuria recently, without dyuria or blood.    LMP 07/08/2013. S/P BTL.   Social Hx; Living with her aunt, which limits her ability to cook for herself.  Moved in with aunt in NOvember 2014 due to economic difficulties, has had difficulty finding her own place to rent since then.   Review of Systems No fevers or chills; no cough or shortness of breath.      Objective:   Physical Exam Well appearing, no apparent distress HEENT neck supple. NO cervical adenopathy. MMM  COR Regular S1S2 PULM Clear bilaterally, no rales or wheezes EXTS: 1-2+ bilateral pitting edema.        Assessment & Plan:

## 2013-07-21 NOTE — Assessment & Plan Note (Signed)
Good control on current regimen. The bilat leg edema is likely secondary to the CCB, however she is reticent to look for changes given the minor inconvenience of the side effect.  We discussed low salt diet, which is hard for her without the means to cook for herself (aunt does not like to have other people cook; aunt cooks with salt).  Gave handout on low salt diets.

## 2013-07-21 NOTE — Assessment & Plan Note (Signed)
To check A1C at next visit in August.

## 2013-07-21 NOTE — Assessment & Plan Note (Signed)
UA not suggestive of cystitis.  No glucose in UA, nor does she have elevated CBGs to suggest polyuria from hyperglycemia.

## 2013-08-03 ENCOUNTER — Other Ambulatory Visit: Payer: Self-pay | Admitting: Family Medicine

## 2013-08-05 ENCOUNTER — Ambulatory Visit (INDEPENDENT_AMBULATORY_CARE_PROVIDER_SITE_OTHER): Payer: Medicaid Other | Admitting: Family Medicine

## 2013-08-05 ENCOUNTER — Encounter: Payer: Self-pay | Admitting: Family Medicine

## 2013-08-05 VITALS — BP 153/93 | HR 87 | Temp 98.0°F | Wt 204.0 lb

## 2013-08-05 DIAGNOSIS — K148 Other diseases of tongue: Secondary | ICD-10-CM

## 2013-08-05 NOTE — Progress Notes (Signed)
Patient ID: Colleen Ford, female   DOB: [birth year], 48 y.o.   MRN: [id number]  Tommi Rumps, MD Phone: [phone]  Colleen Ford is a 48 y.o. female who presents today for same day appointment.  Tongue ring abnormality: had placed on Saturday. Notices a bump in the area on Sunday or Monday. Wanted to know if this was infected. No erythema or drainage. No fevers. No increase in pain. Notes that she has been messing with the area more than she should. Using sea salt mixture for cleaning.  Patient is a former smoker.   ROS: Per HPI   Physical Exam Filed Vitals:   08/05/13 0939  BP: 153/93  Pulse: 87  Temp: 98 F (36.7 C)    Gen: Well NAD HEENT: MMM, central tongue with new piercing, there is no erythema, drainage, or tenderness, there is some white granulation tissue present on the anterior portion of the piercing, no signs of infection    Assessment/Plan: Please see individual problem list.  # Healthcare maintenance: not addressed

## 2013-08-05 NOTE — Patient Instructions (Signed)
Nice to meet you. Your tongue ring site does not look infected. If you develop any redness, drainage from the site, increase in pain, or fever please let us know.

## 2013-08-05 NOTE — Assessment & Plan Note (Signed)
Patient with recent tongue ring placed. Normal healing of puncture site. No signs of infection. Reassured patient. Given return precautions. F/u prn.

## 2013-09-09 ENCOUNTER — Other Ambulatory Visit: Payer: Self-pay | Admitting: Family Medicine

## 2013-09-10 ENCOUNTER — Other Ambulatory Visit: Payer: Self-pay | Admitting: *Deleted

## 2013-09-10 MED ORDER — METFORMIN HCL 500 MG PO TABS: ORAL_TABLET | ORAL | Status: DC

## 2013-09-14 ENCOUNTER — Other Ambulatory Visit (HOSPITAL_COMMUNITY)
Admission: RE | Admit: 2013-09-14 | Discharge: 2013-09-14 | Disposition: A | Discharge status: Home / Self Care | Payer: Medicaid Other | Source: Ambulatory Visit | Attending: Family Medicine | Admitting: Family Medicine

## 2013-09-14 ENCOUNTER — Telehealth: Payer: Self-pay | Admitting: Student

## 2013-09-14 ENCOUNTER — Encounter: Payer: Self-pay | Admitting: Family Medicine

## 2013-09-14 ENCOUNTER — Ambulatory Visit (INDEPENDENT_AMBULATORY_CARE_PROVIDER_SITE_OTHER): Payer: Medicaid Other | Admitting: Family Medicine

## 2013-09-14 VITALS — BP 153/95 | HR 81 | Temp 97.9°F | Wt 205.0 lb

## 2013-09-14 DIAGNOSIS — Z113 Encounter for screening for infections with a predominantly sexual mode of transmission: Secondary | ICD-10-CM | POA: Insufficient documentation

## 2013-09-14 DIAGNOSIS — N760 Acute vaginitis: Secondary | ICD-10-CM

## 2013-09-14 DIAGNOSIS — A599 Trichomoniasis, unspecified: Secondary | ICD-10-CM

## 2013-09-14 DIAGNOSIS — E119 Type 2 diabetes mellitus without complications: Secondary | ICD-10-CM

## 2013-09-14 DIAGNOSIS — M722 Plantar fascial fibromatosis: Secondary | ICD-10-CM

## 2013-09-14 DIAGNOSIS — A5901 Trichomonal vulvovaginitis: Secondary | ICD-10-CM

## 2013-09-14 DIAGNOSIS — N926 Irregular menstruation, unspecified: Secondary | ICD-10-CM

## 2013-09-14 LAB — POCT URINALYSIS DIPSTICK
Bilirubin, UA: NEGATIVE
Glucose, UA: 250
Ketones, UA: NEGATIVE
Nitrite, UA: NEGATIVE
Protein, UA: NEGATIVE
Spec Grav, UA: 1.02
Urobilinogen, UA: 0.2
pH, UA: 6.5

## 2013-09-14 LAB — POCT WET PREP (WET MOUNT): Clue Cells Wet Prep Whiff POC: POSITIVE

## 2013-09-14 LAB — POCT UA - MICROSCOPIC ONLY

## 2013-09-14 LAB — POCT URINE PREGNANCY: Preg Test, Ur: NEGATIVE

## 2013-09-14 MED ORDER — METRONIDAZOLE 500 MG PO TABS: 500.0000 mg | ORAL_TABLET | Freq: Two times a day (BID) | ORAL | Status: DC

## 2013-09-14 NOTE — Assessment & Plan Note (Addendum)
Patient reports sharp right heel pain for the past 1-2 weeks that is worse with walking, standing, and plantar flexion. She reports she feels the pain as soon as she stands on her feet when getting out of bed in the morning. This is most likely plantar fasciitis due to the presence of the pain while standing/walking and the radiation of the pain from her heel to the arch of her foot.  -Pt should take ibuprofen daily for the pain and inflammation.  -Pt can roll a soup can under her right foot in the mornings to loosen the fascia and help with the pain.  -Pt should wear shoes that offer heel and arch support. She should avoid flats until this improves.  -F/u in 1 month with Dr. Lindell Noe or sooner if the pain worsens or does not improve  Patient with R plantar fasciitis; plan to use supportive footwear; heel cup; NSAID regularly; soup can to massage plantar fascia. Colleen Ford, MS3 09/14/2013

## 2013-09-14 NOTE — Telephone Encounter (Signed)
Patient was contacted to discuss her results of today's urine pregnancy, UA, and wet mount. She was told her wet mount showed signs of trichomonas and bacterial vaginosis. Dr. Lindell Noe called in Metronidazole 500 mg twice daily for 7 days to her pharmacy. She was advised to avoid drinking any alcohol while taking this medication as it may cause severe headache, dizziness, and vomiting. She was also advised that she may develop a metallic taste with this medication. If for any reason she is unable to tolerate the medication, she was advised to call Dr. Lindell Noe so he can switch her to a different therapy.  She was counseled on trichomonas and that this is a sexually transmitted infection. She should avoid sexual activity with her partner until he has seen a doctor and has completed treatment as well. She should avoid sexual contact with anyone until she has completed her treatment.  She was told this does not necessarily mean her partner was unfaithful, and he could have had this infection asymptomatically from a previous partner. We will call the patient with the results of her GC/Chlamydia in the next few days.  She was told that her urine pregnancy was negative. She did not have any further questions at the time.   [name], MS3 09/14/2013 12:37 PM

## 2013-09-14 NOTE — Assessment & Plan Note (Signed)
Patient reports left great toe numbness that has worsened over the past one year. Sensation to monofilament test was 8/8 in both feet bilaterally. Strength was 5/5 in the left great toe with flexion and extension. Cap refill in left great toe was <2 seconds. Pulses were normal. The decreased sensation she is experiencing is most likely related to diabetic neuropathy. Blood sugar control will likely help this/prevent this from worsening.  -Pt should continue taking her Metformin for her diabetes.  -She should follow-up with Dr. Lindell Noe in 1 month for this complaint.   [name], MS3 09/14/2013

## 2013-09-14 NOTE — Assessment & Plan Note (Addendum)
Patient presents today with abnormal yellow vaginal discharge for the past week associated with vaginal itching. She denies vaginal burning or odor. She also denies dysuria, hematuria, abdominal pain, pelvic pain, back pain, fevers, and chills. She has had a new female sexual partner for the past 3 months. They do not use protection.  The DDx includes bacterial vaginosis, trichomonas, candidal vaginitis, other sexually transmitted infections, and UTI. Less likely UTI since the patient denies urinary symptoms.  Not concerned about PID today since the patient has no concerns of abdominal pain, pelvic pain, fevers, or chills. She also did not have cervical motion tenderness on exam.   -Wet prep, GC/Chlamydia, and UA obtained today. Urine pregnancy test was negative today. Still awaiting results of GC/Chlamydia. Will call patient with these results.  -Wet prep shows many trichomonads and positive Whiff test suggestive of trichomonas and bacterial vaginosis. No signs of yeast on wet mount.  -Patient was called with her results and started on Metronidazole 500 mg BID for 7 days  -Patient was counseled to avoid sexual activity until her partner has also seen a doctor and been treated  -Patient was counseled to avoid alcohol while taking the Metronidazole as this may cause dizziness, headache, and vomiting. She was also counseled that the medication may cause a metallic taste in her mouth, and if she is unable to tolerate the medicine she should contact Dr. Lindell Noe.  -Patient should follow-up in 1 month with Dr. Lindell Noe or return sooner if symptoms worsen or do not improve.  Patient with trichomonas vaginitis, treatment with metronidazole 500mg  po bid for 7 days. Also suffices as treatment for BV, as evidenced by clue cells on wet prep. Counseled to abstain from intercourse until her partner is treated, she is to notify him that he needs to be treated as well. Counseled on risk for other STIs, including HIV, which is  offered at this time. Patient declines this additional testing. To follow up with results of cervical cultures when available. Evette Georges, MS3 09/14/2013

## 2013-09-14 NOTE — Progress Notes (Signed)
Patient ID: Colleen Ford, female   DOB: 1965/08/24, 48 y.o.   MRN: [id number]   S:  Patient seen and examined by me in conjunction with medical student [name], [facility] MS3, and I agree with her documentation with additions added in bold type.   Patient presents with complaints of: (1) vaginal discharge over the past 1 week, associated with itching. Recently started with a new sexual partner and has had these symptoms since initiating sexual activity with him.  He does not describe dysuria or penile discharge.  Patient denies dysuria or urinary frequency, or fevers.  LMP was in June 2015, has history of skipping several months. JB  2) C/o pain in the right heel, along the calcaneus and radiating forward. Worse when she bears weight on the right foot.  Does describe pain as being severe when she starts to walk, does not get better during the day. No trauma.  She says she usually wears supportive tennis shoes, although her shoes today are slip-on flats without arch support. Soaks have helped; has taken occasional naproxen without relief. JB  3) Describes numbness over the distal tip of her left great toe. No trauma to the toe; longstanding complaint on the order of months to a year.  No pain associated. No weakness associated. Does not radiate upward, does not have on the contralateral side. JB HPI   The patient is a 48 yo woman that presents today for a same day visit for right heel pain, left great toe numbness, and vaginal discharge.   Right heel pain:  She reports right heel pain for the past 1-2 weeks that is worse with standing, walking and plantar flexion. The pain radiates through the arch of her foot when standing/walking. She describes this as a sharp pain, and she rates it as a 10/10 on the pain scale while walking. She denies any recent injury or trauma. The pain is not present at rest, and the pain does not wake her up at night. She feels the pain in the morning as soon as she puts her  foot on the floor when getting out of bed. The pain does not improve or worsen throughout the day. She has taken Aleve for this with no relief. She reports some relief with soaking the foot in warm water or walking barefoot on the ground. She normally stands on her feet for 8 hours for work, and she wears tennis shoes to work.    Left great toe numbness:  She reports numbness in her left great toe that has been worsening over the last year. This numbness started on the medial side of the toe, but now extends over the entire great toe. No other toes are involved. She takes Metformin 500 mg 2x daily for her diabetes. She denies weakness of the toe, and she denies numbness/tingling any other place. She denies pain to the toe.   Vaginal discharge:  She also reports yellow vaginal discharge for the past week associated with intermittent vaginal itching. She is currently sexually active with her boyfriend of 3 months. They do not use protection. She has no other partners. She does report abnormal vaginal discharge and bleeding after intercourse once this past week. No dyspareunia. She denies vaginal burning, vaginal odor, dysuria, hematuria, abdominal pain, pelvic pain, back pain, fevers, and chills. Her LNMP was 07/08/2013 lasting only one day. She does report irregular periods over the past several months. She had a menstrual cycle during February, March, May, and June, skipping April,  July, and August 2015.   ROS: Gen: No fevers, chills, unintentional weight loss. Resp: No SOB.  Cardiac: No CP or palpitations.  Abdomen: No abdominal pain. No pelvic pain.  Vaginal: Positive for yellow vaginal discharge and vaginal itching. Negative for vaginal sores or lesions. Negative for vaginal burning.  Urinary: No dysuria or hematuria.  As per HPI.    O:   Physical Exam:  Vitals: BP 153/95  Pulse 81  Temp(Src) 97.9 F (36.6 C) (Oral)  Wt 205 lb (92.987 kg) Gen: Well-appearing. NAD. JB HEENT: Neck  supple. Resp: CTA. No wheezes. No crackles.  Cardiac: RRR. Normal S1 and S2. No murmurs, rubs, or gallops.  Abdomen: Soft. Non-tender. Normoactive bowel sounds. No masses. No CVA tenderness.  Pelvic: Normal external genitalia. No sores or lesions visualized externally.  Normal cervix visualized. Thin, white-yellow discharge present. Vaginal mucosa normal in appearance. Wet prep and GC/Chlamydia obtained during exam. No cervical motion tenderness. Fishy odor noted during speculum exam. Chaperone present.  Normal external genitalia, vaginal mucosa.  Whitish creamy discharge noted with speculum exam. Normal cervix. No CMT and no adnexal tenderness or masses on exam. JB Extremities: Tenderness with deep palpation over right calcaneus. No erythema. Sensation with monofilament test 8/8 bilaterally. Strength 5/5 with right foot plantar flexion and dorsiflexion. Strength 5/5 with left great toe extension and flexion. Left toe cap refill <2 seconds. No clonus. Pulses normal bilaterally.  Bilateral monofilament exam performed, no deficits in sensation of fine touch. No clonus bilaterally. Dorsi/plantar flexion of both feet intact and symmetric; great toe flexion/extension full and symmetric bilaterally. Brisk capillary refill bilaterally on great toes.  DP pulses palpable and symmetric bilaterally. Tenderness along R calcaneus and plantar fascia; no tenderness along achilles tendon. Full dorsi/plantarflexion of R foot. JB    A/P:  See problem specific assessment and plan.   [name], MS3 09/14/2013

## 2013-09-14 NOTE — Patient Instructions (Addendum)
It was a pleasure to see you today.  I believe your right foot pain is related to a condition called PLANTAR FASCIITIS.  1. Wear supportive footwear (with arch support); a heel cup can sometimes be useful.  NO FLATS. 2. Please use a soup can to massage the bottom of your foot every morning BEFORE YOU TAKE YOUR FIRST STEP.   3. You may use Naproxen 440mg  daily, OR ibuprofen 400-800mg , in the mornings with something to eat.   We are performing studies to assess the vaginal discharge.  Recommendation for barrier methods (condoms) or abstinence to prevent sexually transmitted infections.  Follow up with Dr Lindell Noe in approximately 1 month. [phone] patient cell.

## 2013-09-14 NOTE — Assessment & Plan Note (Signed)
Patient reports her LNMP was 07/08/2013. This is not the first month she has missed a period.  She reports having a period in February, March, May, and June, skipping periods in April, July, and August. Urine pregnancy was obtained today and was negative. This is likely due to perimenopausal transition as the patient is 48 yo.  -Pt was called and told her urine pregnancy was negative today.  -F/u with Dr. Lindell Noe in 1 month.   [name], MS3 09/14/2013

## 2013-09-15 ENCOUNTER — Telehealth: Payer: Self-pay | Admitting: Family Medicine

## 2013-09-15 NOTE — Telephone Encounter (Signed)
Called patient's cell number to report negative cervical culture results. Left voice message for her to call the office. I wish to convey that her cervical cultures are negative. JB

## 2013-09-16 NOTE — Telephone Encounter (Signed)
Message delivered

## 2013-10-19 ENCOUNTER — Encounter: Payer: Self-pay | Admitting: Family Medicine

## 2013-10-19 ENCOUNTER — Ambulatory Visit (INDEPENDENT_AMBULATORY_CARE_PROVIDER_SITE_OTHER): Payer: Medicaid Other | Admitting: Family Medicine

## 2013-10-19 VITALS — BP 148/90 | HR 78 | Temp 97.8°F | Ht 64.0 in | Wt 205.2 lb

## 2013-10-19 DIAGNOSIS — M722 Plantar fascial fibromatosis: Secondary | ICD-10-CM

## 2013-10-19 DIAGNOSIS — I10 Essential (primary) hypertension: Secondary | ICD-10-CM | POA: Diagnosis not present

## 2013-10-19 DIAGNOSIS — A5901 Trichomonal vulvovaginitis: Secondary | ICD-10-CM

## 2013-10-19 DIAGNOSIS — Z23 Encounter for immunization: Secondary | ICD-10-CM

## 2013-10-19 DIAGNOSIS — E119 Type 2 diabetes mellitus without complications: Secondary | ICD-10-CM

## 2013-10-19 DIAGNOSIS — A599 Trichomoniasis, unspecified: Secondary | ICD-10-CM

## 2013-10-19 LAB — POCT GLYCOSYLATED HEMOGLOBIN (HGB A1C): Hemoglobin A1C: 7.8

## 2013-10-19 MED ORDER — AMLODIPINE BESYLATE 10 MG PO TABS: 10.0000 mg | ORAL_TABLET | Freq: Every day | ORAL | Status: DC

## 2013-10-19 MED ORDER — METFORMIN HCL 1000 MG PO TABS: 1000.0000 mg | ORAL_TABLET | Freq: Two times a day (BID) | ORAL | Status: DC

## 2013-10-19 NOTE — Patient Instructions (Signed)
It was a pleasure to see you today.   For the right heel pain and the left ankle/toe numbness, I am referring you to the [facility].  For your diabetes, I am increasing your metformin to 1000mg  twice daily (new prescription for 1000mg  tablets, so same number of pills as before).  For the blood pressure, I am increasing the amlodipine to 10mg  daily (your current prescription is for 5mg ; you may take 2 tablets of the 5mg  strength until gone).   I would like to see you back in 3 months, or sooner as needed.  [facility] REFERRAL FOLLOW UP WITH DR Lindell Noe IN 3 MONTHS.

## 2013-10-19 NOTE — Progress Notes (Signed)
   Subjective:    Patient ID: Colleen Ford, female    DOB: 08-06-1965, 48 y.o.   MRN: [id number]  HPI Colleen Ford for follow up of her DM, HTN as well as R plantar fasciitis and L great toe numbness.  She has had no changes in diet or activity; works standing up for a long time. Is using tennis shoes (lack arch support, has them on today).  Says that the R heel pain is marginally better now that she uses tennis shoes rather than flats.  Not using NSAIDs currently. Had used a heel cup without relief.  The L great toe numbness is continuous.  Now seems confluent with perceived swelling in anterior aspect of L ankle. Also intermittent R great toe numbness (none now).  No motor weakness in either foot or great toe. No falls. Standing for much of the day at work.   Social Hx; Living with her aunt, unable to rent on her own.  Nonsmoker. Broke up with former boyfriend after Trich at last visit. Recently started new sexual relationship, condoms. S/p BTL.  Last menses 2 months ago.   Fam Hx; Mother went through menopause at age 12, sister at age 71.    Review of Systems     Objective:   Physical Exam Well appearing, no apparent distress HEENT neck supple, no cervical adenopathy.  COR Regular S1S2 PULM Clear bilaterally MSK: Tenderness over R plantar fascia insertion/calcaneus.  Full ROM dorsiflexion/plantarflexion both feet. No edema or limitations in active ROM in either ankle. Palpable dp pulses bilaterally. Strength with L great toe flexion/extension is full and intact. Sensation diminished on L great toe compared to R        Assessment & Plan:

## 2013-10-20 NOTE — Assessment & Plan Note (Signed)
Modest improvement with tennis shoes, which provide slightly more support than previous footwear. She is reticent to proceed directly to injection.  Consideration of orthotics if indicated, versus possible injection at [facility].  Patient agrees to referral for this.

## 2013-10-20 NOTE — Assessment & Plan Note (Signed)
Symptoms of vaginal discharge resolved since treatment. She is no longer with this sexual partner.

## 2013-10-20 NOTE — Assessment & Plan Note (Signed)
Increase metformin. Flu shot.

## 2013-10-25 ENCOUNTER — Ambulatory Visit (INDEPENDENT_AMBULATORY_CARE_PROVIDER_SITE_OTHER): Payer: Medicaid Other | Admitting: Family Medicine

## 2013-10-25 ENCOUNTER — Encounter: Payer: Self-pay | Admitting: Family Medicine

## 2013-10-25 VITALS — BP 150/94 | Ht 64.0 in | Wt 205.0 lb

## 2013-10-25 DIAGNOSIS — M722 Plantar fascial fibromatosis: Secondary | ICD-10-CM

## 2013-10-25 DIAGNOSIS — E119 Type 2 diabetes mellitus without complications: Secondary | ICD-10-CM

## 2013-10-28 NOTE — Progress Notes (Signed)
Patient ID: Colleen Ford, female   DOB: 11/23/1965, 48 y.o.   MRN: [id number]  Felicia Bloomquist - 48 y.o. female MRN [id number]  Date of birth: 1965-10-18    SUBJECTIVE:     Right foot pain for a month. Left hurts occasionally but right is constant. Worse first  Step in AM. Worse with standing long periods. Has been doing plantar fascia stretches with little improvement. No known specific injury ROS:     Pertinent review of systems: negative for fever or unusual weight change.   PERTINENT  PMH / PSH FH / / SH:  Past Medical, Surgical, Social, and Family History Reviewed & Updated in the EMR.  Pertinent findings include:  DM HTN obesity  OBJECTIVE: BP 150/94  Ht 5\' 4"  (1.626 m)  Wt 205 lb (92.987 kg)  BMI 35.17 kg/m2  LMP 07/08/2013  Physical Exam:  Vital signs are reviewed. WD ovweweight NAD FEET: B loss of longitudinal and transverse arch, worseon right. TTP plantar fascia origin. SKIN no callous or lesions NEURO soft touch sensation B normal feet VASC Normal DP and PT pulses B GAIT--over pronation on right foot, normal stride length  Patient was fitted for a : standard, cushioned, semi-rigid orthotic. The orthotic was heated, placed on the orthotic stand. The patient was positioned in subtalar neutral position and 10 degrees of ankle dorsiflexion in a weight bearing stance on the heated orthotic blank After completion of molding, a stable base was applied to the orthotic blank. The blank was ground to a stable position for weight bearing. Blank: red Base:blue foam Posting:scaphoid pads  Face to face time spent in evaluation, measurement and manufacture of custom molded orthotic was 40 minutes.   ASSESSMENT & PLAN:  See problem based charting & AVS for pt instructions.

## 2013-10-28 NOTE — Assessment & Plan Note (Signed)
Custom molded orthotics If she is not improved after making this mechanical change in 4-6 weeks I would consider injection She can f/u prn

## 2013-11-02 ENCOUNTER — Telehealth: Payer: Self-pay | Admitting: *Deleted

## 2013-11-02 NOTE — Telephone Encounter (Signed)
Pt seen recently for discharge, received medication and it seemed to clear up but now is having discharge again. Wants to know if MD will prescribe something different to clear it up.

## 2013-11-03 NOTE — Telephone Encounter (Signed)
Tried calling, no answer no voicemail. Patient will need appointment before anything can be prescribed.

## 2013-11-03 NOTE — Telephone Encounter (Signed)
Pt called and would still like some medication so that she does not have to miss work. Please call her and let her know either way. jw

## 2013-11-03 NOTE — Telephone Encounter (Signed)
Should have an appt for a wet prep/cultures before determining how to treat. Can be with whomever is available.  JB

## 2013-11-11 ENCOUNTER — Ambulatory Visit: Payer: Medicaid Other | Admitting: Family Medicine

## 2013-11-23 ENCOUNTER — Ambulatory Visit (INDEPENDENT_AMBULATORY_CARE_PROVIDER_SITE_OTHER): Payer: Medicaid Other | Admitting: Family Medicine

## 2013-11-23 ENCOUNTER — Other Ambulatory Visit (HOSPITAL_COMMUNITY)
Admission: RE | Admit: 2013-11-23 | Discharge: 2013-11-23 | Disposition: A | Discharge status: Home / Self Care | Payer: Medicaid Other | Source: Ambulatory Visit | Attending: Family Medicine | Admitting: Family Medicine

## 2013-11-23 ENCOUNTER — Encounter: Payer: Self-pay | Admitting: Family Medicine

## 2013-11-23 VITALS — BP 142/84 | HR 85 | Temp 97.9°F | Ht 64.0 in | Wt 197.7 lb

## 2013-11-23 DIAGNOSIS — N760 Acute vaginitis: Secondary | ICD-10-CM

## 2013-11-23 DIAGNOSIS — Z113 Encounter for screening for infections with a predominantly sexual mode of transmission: Secondary | ICD-10-CM | POA: Insufficient documentation

## 2013-11-23 DIAGNOSIS — N898 Other specified noninflammatory disorders of vagina: Secondary | ICD-10-CM

## 2013-11-23 DIAGNOSIS — A499 Bacterial infection, unspecified: Secondary | ICD-10-CM

## 2013-11-23 DIAGNOSIS — B9689 Other specified bacterial agents as the cause of diseases classified elsewhere: Secondary | ICD-10-CM

## 2013-11-23 LAB — POCT WET PREP (WET MOUNT): Clue Cells Wet Prep Whiff POC: POSITIVE

## 2013-11-23 LAB — POCT URINE PREGNANCY: Preg Test, Ur: NEGATIVE

## 2013-11-23 MED ORDER — METRONIDAZOLE 500 MG PO TABS: 500.0000 mg | ORAL_TABLET | Freq: Two times a day (BID) | ORAL | Status: DC

## 2013-11-23 NOTE — Progress Notes (Signed)
   Subjective:    Patient ID: Colleen Ford, female    DOB: 21-Mar-1965, 48 y.o.   MRN: [id number]  HPI Patient scheduled for same-day appointment for complaint of vaginal discharge with mild itch. Had been diagnosed and treated for Trichomonas a couple of months ago, completed treatment with metronidazole and reported complete resolution of symptoms. Has not had any sexual partner since that time. Denies fever/chills, no dysuria or changes in urinary habits, no abdominal/pelvic pain.  LMP June 2015.  S/p BTL after son born 2 years ago.  She declines HIV test today; reports having had HIV testing negative in the past elsewhere.   Still gets some tingling in the L great toe from time to time. Comes on suddenly while sitting, then resolves.  Review of Systems     Objective:   Physical Exam Well appearing, no apparent distress GYN: Normal external genitalia. Pink vaginal mucosa. Thin white discharge noted. Healthy cervix appearance, friable and with small amount of old blood from os with insertion of GC/CT probe.  DM monofilament exam performed, normal sensation in all areas. Palpable dp pulses bilaterally, no maceration or ulceration in feet.        Assessment & Plan:

## 2013-11-24 LAB — CERVICOVAGINAL ANCILLARY ONLY
Chlamydia: NEGATIVE
Neisseria Gonorrhea: NEGATIVE

## 2013-11-24 NOTE — Assessment & Plan Note (Signed)
Wet prep showing BV.  Patient not with any new sexual partners since recent Trich diagnosis. She declines HIV testing again today, offered. Plan to send cervical cultures for GC/CT today as well. Treat with Flagyl for BV, to come back if not adequately treated. No evidence of yeast.

## 2013-11-26 ENCOUNTER — Telehealth: Payer: Self-pay | Admitting: Family Medicine

## 2013-11-26 DIAGNOSIS — E119 Type 2 diabetes mellitus without complications: Secondary | ICD-10-CM

## 2013-11-26 NOTE — Telephone Encounter (Signed)
Pt called and needs a referral to her eye doctor. It is called [facility], [address] on [address] jw

## 2013-11-29 NOTE — Telephone Encounter (Signed)
Done.  Please make referral to Ophthalmologist of patient's choice.  Dalbert Mayotte, MD

## 2013-12-01 NOTE — Telephone Encounter (Signed)
Patient need referral sent to [facility] on [address]. Before her appt on Saturday.

## 2013-12-02 NOTE — Telephone Encounter (Signed)
Talked with pt about me calling and trying to make an appt. They told me that they didn't accept medicaid. Pt informed. Pt states that she already has an appt they just need a referral. i had her to give them my number and to call me back to confirm referral. Waiting for a call back, will call them if i dont receive a call back. [place], [place] CMA

## 2013-12-03 NOTE — Telephone Encounter (Signed)
Faxed referral to my [facility]. Blount, Deseree C

## 2013-12-04 LAB — HM DIABETES EYE EXAM

## 2014-01-13 ENCOUNTER — Other Ambulatory Visit: Payer: Self-pay | Admitting: Family Medicine

## 2014-01-14 ENCOUNTER — Other Ambulatory Visit (HOSPITAL_COMMUNITY)
Admission: RE | Admit: 2014-01-14 | Discharge: 2014-01-14 | Disposition: A | Discharge status: Home / Self Care | Payer: Medicaid Other | Source: Ambulatory Visit | Attending: Family Medicine | Admitting: Family Medicine

## 2014-01-14 ENCOUNTER — Ambulatory Visit (INDEPENDENT_AMBULATORY_CARE_PROVIDER_SITE_OTHER): Payer: Medicaid Other | Admitting: Family Medicine

## 2014-01-14 ENCOUNTER — Encounter: Payer: Self-pay | Admitting: Family Medicine

## 2014-01-14 VITALS — BP 154/84 | HR 98 | Temp 98.5°F | Ht 64.0 in | Wt 195.0 lb

## 2014-01-14 DIAGNOSIS — A499 Bacterial infection, unspecified: Secondary | ICD-10-CM

## 2014-01-14 DIAGNOSIS — Z124 Encounter for screening for malignant neoplasm of cervix: Secondary | ICD-10-CM

## 2014-01-14 DIAGNOSIS — B9689 Other specified bacterial agents as the cause of diseases classified elsewhere: Secondary | ICD-10-CM

## 2014-01-14 DIAGNOSIS — N760 Acute vaginitis: Secondary | ICD-10-CM

## 2014-01-14 DIAGNOSIS — N888 Other specified noninflammatory disorders of cervix uteri: Secondary | ICD-10-CM

## 2014-01-14 DIAGNOSIS — N898 Other specified noninflammatory disorders of vagina: Secondary | ICD-10-CM

## 2014-01-14 DIAGNOSIS — Z113 Encounter for screening for infections with a predominantly sexual mode of transmission: Secondary | ICD-10-CM | POA: Diagnosis present

## 2014-01-14 DIAGNOSIS — Z1151 Encounter for screening for human papillomavirus (HPV): Secondary | ICD-10-CM | POA: Insufficient documentation

## 2014-01-14 DIAGNOSIS — R300 Dysuria: Secondary | ICD-10-CM

## 2014-01-14 DIAGNOSIS — Z01419 Encounter for gynecological examination (general) (routine) without abnormal findings: Secondary | ICD-10-CM | POA: Diagnosis present

## 2014-01-14 DIAGNOSIS — A599 Trichomoniasis, unspecified: Secondary | ICD-10-CM

## 2014-01-14 DIAGNOSIS — N926 Irregular menstruation, unspecified: Secondary | ICD-10-CM

## 2014-01-14 DIAGNOSIS — E119 Type 2 diabetes mellitus without complications: Secondary | ICD-10-CM

## 2014-01-14 LAB — POCT URINALYSIS DIPSTICK
Bilirubin, UA: NEGATIVE
Glucose, UA: NEGATIVE
Ketones, UA: NEGATIVE
Nitrite, UA: POSITIVE
Protein, UA: NEGATIVE
Spec Grav, UA: 1.02
Urobilinogen, UA: 0.2
pH, UA: 6.5

## 2014-01-14 LAB — POCT WET PREP (WET MOUNT)
Clue Cells Wet Prep Whiff POC: POSITIVE
WBC, Wet Prep HPF POC: >20

## 2014-01-14 LAB — POCT UA - MICROSCOPIC ONLY

## 2014-01-14 MED ORDER — ATORVASTATIN CALCIUM 20 MG PO TABS: 20.0000 mg | ORAL_TABLET | Freq: Every day | ORAL | Status: DC

## 2014-01-14 MED ORDER — METRONIDAZOLE 500 MG PO TABS: 500.0000 mg | ORAL_TABLET | Freq: Two times a day (BID) | ORAL | Status: DC

## 2014-01-14 NOTE — Progress Notes (Signed)
   Subjective:    Patient ID: Colleen Ford, female    DOB: [birth year], 48 y.o.   MRN: [id number]  HPI Patient for PAP, due this month. LMP 12/31/2013, lasted 7 days. Previous menses in May. Skipped APril, had menses Jan through March. Not sexually active for the past several months since recent treatment for BV and trichomonas.   Has been having vaginal discharge recently, without dysuria but has had urinary frequency. Had one episode dysuria and urinary urgency but not a regular complaint. No fevers or chills     Review of Systems     Objective:   Physical Exam Well appearing, no apparent distress HEENT Neck supple, no cervical adenopathy.  PELVIC: External genitalia normal. Cervix with copious creamy white discharge. External os friable, with small friable polyps visible at os. Collection of GC/CT with cotton-tipped probe provokes bleeding. Bimanual exam without cervical motion tenderness or adnexal masses/tenderness.        Assessment & Plan:

## 2014-01-14 NOTE — Patient Instructions (Signed)
It was a pleasure to see you today.  We did a pap smear and wet prep, urine studies.   I would like to have you seen in the Col[facility]copy Clinic in our office.    I will call you at [phone] with the results of the studies.

## 2014-01-14 NOTE — Assessment & Plan Note (Signed)
Has had a few missed periods; LMP 11/27-12/04; previous menses in May, skipped April and had menses Jan-March, each lasting between 3 and 7 days.

## 2014-01-14 NOTE — Assessment & Plan Note (Signed)
Patient with friable cervix, internal os with small friable polyps on speculum exam. Pap smear with cyto brush done today. For Colposcopy follow up irrespective of Pap results, given clinical findings.

## 2014-01-14 NOTE — Assessment & Plan Note (Signed)
Patient with trich and BV on wet prep today; had been treated 2 months ago and states she completed the Flagyl 500 bid x7 day course. Not sexually active since that time.  To retreat and only retest if discharge not resolved.

## 2014-01-15 LAB — MICROALBUMIN / CREATININE URINE RATIO
Creatinine, Urine: 147.7 mg/dL
Microalb Creat Ratio: 19 mg/g (ref 0.0–30.0)
Microalb, Ur: 2.8 mg/dL — ABNORMAL HIGH (ref <2.0)

## 2014-01-17 LAB — CYTOLOGY - PAP

## 2014-01-17 LAB — CERVICOVAGINAL ANCILLARY ONLY
Chlamydia: NEGATIVE
Neisseria Gonorrhea: NEGATIVE

## 2014-01-21 ENCOUNTER — Telehealth: Payer: Self-pay | Admitting: Family Medicine

## 2014-01-21 NOTE — Telephone Encounter (Signed)
Called patient to report results of cytology (PAP) and urine microalbumin, cervical cultures. Pap and cervical cultures negative, Urine microalbumin positive.  She has had swelling with ACEI in the past which makes me concerned about ACEI and ARB treatment.  Plan to continue with glycemic control and blood pressure control as best as possible to protect renal function. JB

## 2014-02-10 ENCOUNTER — Ambulatory Visit (INDEPENDENT_AMBULATORY_CARE_PROVIDER_SITE_OTHER): Payer: Medicaid Other | Admitting: Family Medicine

## 2014-02-10 VITALS — BP 149/86 | HR 76 | Temp 98.1°F | Ht 64.0 in | Wt 201.4 lb

## 2014-02-10 DIAGNOSIS — N841 Polyp of cervix uteri: Secondary | ICD-10-CM

## 2014-02-10 DIAGNOSIS — A599 Trichomoniasis, unspecified: Secondary | ICD-10-CM

## 2014-02-10 LAB — POCT WET PREP (WET MOUNT): Clue Cells Wet Prep Whiff POC: NEGATIVE

## 2014-02-10 NOTE — Progress Notes (Signed)
Patient ID: Colleen Ford, female   DOB: [birth year], 49 y.o.   MRN: [id number] Patient sent for further evaluation of abnormality seen on gross exam of the cervix during her Pap smear. Her Pap smear last office visit was negative for intraepithelial lesions. Notably she did have Trichomonas noted on the Pap smear. She has been previously treated for Trichomonas, prior to the day of the Pap smear, and had a second course which she completed as well.  ASSESSMENT: #1.   Cervical polyp biopsy with colposcopy #2. Repeat wet prep

## 2014-02-24 ENCOUNTER — Telehealth: Payer: Self-pay | Admitting: *Deleted

## 2014-02-24 ENCOUNTER — Encounter: Payer: Self-pay | Admitting: Family Medicine

## 2014-02-24 NOTE — Telephone Encounter (Signed)
Patient calling asking for results of recent cervical biopsy and if anything else needs to be done.

## 2014-02-24 NOTE — Telephone Encounter (Signed)
aSHA PLEASE LET HER KNOW A LETTER IS ON THE WAY---BASICALLY IT WAS A NORMAL POLYP--NOTHING TO WORRY ABOUT. Would do nothing unless she starts having a lot of bothersome spotting THANKS! Dorcas Mcmurray

## 2014-02-28 NOTE — Telephone Encounter (Signed)
Left message on voicemail for patient to call back. 

## 2014-03-02 NOTE — Telephone Encounter (Signed)
Left another message for patient to return call.

## 2014-03-03 NOTE — Telephone Encounter (Signed)
Letter sent to patient address.

## 2014-03-29 ENCOUNTER — Ambulatory Visit (INDEPENDENT_AMBULATORY_CARE_PROVIDER_SITE_OTHER): Payer: Medicaid Other | Admitting: Family Medicine

## 2014-03-29 ENCOUNTER — Encounter: Payer: Self-pay | Admitting: Family Medicine

## 2014-03-29 DIAGNOSIS — J209 Acute bronchitis, unspecified: Secondary | ICD-10-CM

## 2014-03-29 MED ORDER — AZITHROMYCIN 250 MG PO TABS: ORAL_TABLET | ORAL | Status: DC

## 2014-03-29 NOTE — Patient Instructions (Signed)
It was a pleasure to see you today.  I believe you may have an acute bronchitis versus possibly a 'walking pneumonia'.   I sent a prescription for AZITHROMYCIN 250mg  TAKE 2 TABLETS BY MOUTH ON DAY ONE, THEN 1 TABLET DAILY FOR THE NEXT 4 DAYS   If you are not getting markedly better with respect to the cough by next week (around the 2nd of March), then please get the chest x-ray that I ordered at the [facility] on [address] near [address].

## 2014-03-29 NOTE — Assessment & Plan Note (Signed)
Patient with three weeks of coughing paroxysms, accompanied by thick sputum, nasal discharge, occasionally tinged with blood.  Auscultation with crackles heard in left lower lung field.  Consideration for atypical pneumonia, versus acute bacterial bronchitis. Patient is a lifetime nonsmoker, is exposed to smoke in the shelter where she's been living since November (2015).  Plan to treat empirically with azithro, she is instructed to go for CXR if not markedly improved in the coming week, at which point I will call her with the results of the x-ray.

## 2014-03-29 NOTE — Progress Notes (Signed)
   Subjective:    Patient ID: Colleen Ford, female    DOB: July 11, 1965, 49 y.o.   MRN: [id number]  HPI Patient here for acute visit, for complaint of coughing paroxysms for the past 2-3 weeks, with thick purulent sputum at times. Other times with difficulty bringing up phlegm. Has seen some blood-tinged mucus in sputum, also when she blows her nose, which has been congested. Bilateral ear congestion that alternates, occasional sharp momentary ear pain. No ear discharge.  No fevers or chills. Not taking any meds except for Mucinex on occasion, with some relief.   Overall feels about the same.   Social Hx; She is a lifelong nonsmoker, but is exposed to secondhand smoke in the shelter where she has been living since November.       Review of Systems     Objective:   Physical Exam Generally well; no apparent distress. Speaking fluidly. HEENT neck supple, no cervical adenopathy. TMs clear with good visible cones of light. Injected conjunctivae. Nasal mucosa with thin watery discharge. No frontal or maxillary sinus tenderness. Moist mucus membranes COR Regular S1S2 PULM Good air movement. Crackles in left lung base. No wheezes        Assessment & Plan:

## 2014-06-21 ENCOUNTER — Encounter: Payer: Self-pay | Admitting: Family Medicine

## 2014-07-06 ENCOUNTER — Ambulatory Visit (INDEPENDENT_AMBULATORY_CARE_PROVIDER_SITE_OTHER): Payer: Medicaid Other | Admitting: Family Medicine

## 2014-07-06 ENCOUNTER — Encounter: Payer: Self-pay | Admitting: Family Medicine

## 2014-07-06 VITALS — BP 132/91 | HR 83 | Temp 98.1°F | Ht 64.0 in | Wt 200.2 lb

## 2014-07-06 DIAGNOSIS — E119 Type 2 diabetes mellitus without complications: Secondary | ICD-10-CM

## 2014-07-06 DIAGNOSIS — E785 Hyperlipidemia, unspecified: Secondary | ICD-10-CM | POA: Diagnosis not present

## 2014-07-06 DIAGNOSIS — F329 Major depressive disorder, single episode, unspecified: Secondary | ICD-10-CM | POA: Diagnosis not present

## 2014-07-06 DIAGNOSIS — I10 Essential (primary) hypertension: Secondary | ICD-10-CM

## 2014-07-06 DIAGNOSIS — F32A Depression, unspecified: Secondary | ICD-10-CM

## 2014-07-06 LAB — POCT GLYCOSYLATED HEMOGLOBIN (HGB A1C): Hemoglobin A1C: 6.6

## 2014-07-06 NOTE — Patient Instructions (Signed)
Good to see you today!  Thanks for coming in.  Your diabetes is doing great  If you begin to feel worse with depression please come in or call us  Come back in 3 months or as needed

## 2014-07-06 NOTE — Progress Notes (Signed)
   Subjective:    Patient ID: Colleen Ford, female    DOB: 16-Jul-1965, 49 y.o.   MRN: [id number]  HPI  DIABETES Disease Monitoring: Blood Sugar ranges-last random check was 129 Polyuria/phagia/dipsia- no      Visual problems- no Medications: Compliance- daily metformin Hypoglycemic symptoms- no   Depression Feeling down for a while (months).  Stable not worsening. Believes is due to her homelessness.  Does not seem to interfere with her function and has no suicidal ideation (thoughts of hurting herself or any plans).  She has not been treated before and does not wish to now but would be open to if worsens.  Can talk with her sister and a friend.  Has a case Freight forwarder.  PHQ9 reviewed with her and noted  HYPERTENSION Disease Monitoring Home BP Monitoring not checking  Chest pain- no    Dyspnea- no Medications Compliance-  Taking amlodipine. Lightheadedness-  no  Edema- no ROS - See HPI  PMH Lab Review   POTASSIUM  Date Value Ref Range Status  01/12/2013 3.6 3.5 - 5.3 mEq/L Final   SODIUM  Date Value Ref Range Status  01/12/2013 138 135 - 145 mEq/L Final   CREAT  Date Value Ref Range Status  01/12/2013 0.70 0.50 - 1.10 mg/dL Final   CREATININE, SER  Date Value Ref Range Status  04/04/2010 0.87 0.40-1.20 mg/dL Final       Social - homeless living in shelter with children (age 41 and 42) since October.  Works at [employer] on and off  Review of Systems     Objective:   Physical Exam Alert interactive Psych:  Cognition and judgment appear intact. Alert, communicative  and cooperative with normal attention span and concentration. No apparent delusions, illusions, hallucinations Heart - Regular rate and rhythm.  No murmurs, gallops or rubs.    Lungs:  Normal respiratory effort, chest expands symmetrically. Lungs are clear to auscultation, no crackles or wheezes. Extremities:  No cyanosis, edema, or deformity noted with good range of motion of all major joints.           Assessment & Plan:

## 2014-07-07 DIAGNOSIS — E785 Hyperlipidemia, unspecified: Secondary | ICD-10-CM | POA: Insufficient documentation

## 2014-07-07 NOTE — Assessment & Plan Note (Signed)
She has stopped taking her lipitor due to concerns it is causing hair loss. Will recheck her lipids.  I encouraged her to try lipitor again as this is an unlikely side effect  Lab Results  Component Value Date   LDLCALC 111* 01/12/2013

## 2014-07-07 NOTE — Assessment & Plan Note (Signed)
Nearly at goal.  Will continue to monitor on current therapy  BP Readings from Last 3 Encounters:  07/06/14 132/91  03/29/14 132/88  02/10/14 149/86

## 2014-07-07 NOTE — Assessment & Plan Note (Signed)
Discussed this with her a length.  She repeatedly denies any suicidal ideation and does not want any treatment currenlty.  Feels she will improve if her financial situation improves which she believe it will.  Has contacts she would feel comfortable calling if she feel worse.  Will monitor

## 2014-07-07 NOTE — Assessment & Plan Note (Signed)
Improved. Continue current therapy.

## 2014-11-07 ENCOUNTER — Other Ambulatory Visit: Payer: Self-pay | Admitting: Family Medicine

## 2014-11-07 DIAGNOSIS — I10 Essential (primary) hypertension: Secondary | ICD-10-CM

## 2014-11-07 MED ORDER — AMLODIPINE BESYLATE 10 MG PO TABS: 10.0000 mg | ORAL_TABLET | Freq: Every day | ORAL | Status: DC

## 2014-11-07 NOTE — Telephone Encounter (Signed)
Need refill on her amlodipine

## 2015-02-10 ENCOUNTER — Encounter: Payer: Self-pay | Admitting: *Deleted

## 2015-03-02 ENCOUNTER — Encounter: Payer: Self-pay | Admitting: Obstetrics and Gynecology

## 2015-03-02 ENCOUNTER — Ambulatory Visit (INDEPENDENT_AMBULATORY_CARE_PROVIDER_SITE_OTHER): Payer: PRIVATE HEALTH INSURANCE | Admitting: Obstetrics and Gynecology

## 2015-03-02 ENCOUNTER — Other Ambulatory Visit (HOSPITAL_COMMUNITY)
Admission: RE | Admit: 2015-03-02 | Discharge: 2015-03-02 | Disposition: A | Discharge status: Home / Self Care | Payer: PRIVATE HEALTH INSURANCE | Source: Ambulatory Visit | Attending: Obstetrics and Gynecology | Admitting: Obstetrics and Gynecology

## 2015-03-02 VITALS — BP 130/76 | HR 80 | Temp 98.1°F | Ht 64.0 in | Wt 186.6 lb

## 2015-03-02 DIAGNOSIS — N950 Postmenopausal bleeding: Secondary | ICD-10-CM

## 2015-03-02 NOTE — Progress Notes (Signed)
Patient ID: Colleen Ford, female   DOB: 1965/11/07, 50 y.o.   MRN: [id number] 50 yo referred for the evaluation of postmenopausal vaginal bleeding. Patient reports being in menopause since 2015. She reports experiencing vaginal spotting in December 2016 which lasted 2-3 days consisting of dark blood. She used panty liners and had to change 3 of them over this episode of vaginal bleeding. She reports experiencing some spotting following intercourse. She was diagnosed and treated for trichomoniasis and has not experienced vaginal bleeding since. She denies any abdominal or pelvic pain   Past Medical History  Diagnosis Date  . Hypertension   . Diabetes mellitus without complication ([place])   . Anemia    Past Surgical History  Procedure Laterality Date  . Tubal ligation     History reviewed. No pertinent family history. Social History  Substance Use Topics  . Smoking status: Former Research scientist (life sciences)  . Smokeless tobacco: Never Used  . Alcohol Use: None   ROS See pertinent in HPI Blood pressure 130/76, pulse 80, temperature 98.1 F (36.7 C), height 5\' 4"  (1.626 m), weight 186 lb 9.6 oz (84.641 kg), last menstrual period 07/08/2013.  GENERAL: Well-developed, well-nourished female in no acute distress.  ABDOMEN: Soft, nontender, nondistended. No organomegaly. PELVIC: Normal external female genitalia. Vagina is pink and rugated.  Normal discharge. Normal appearing cervix with a 0.5 cm polypoid mass visualized at the os. Uterus is normal in size. No adnexal mass or tenderness. EXTREMITIES: No cyanosis, clubbing, or edema, 2+ distal pulses.  A/P 50 yo with episode of postmenopausal vaginal bleeding - discussed the benefits of performing an endometrial biopsy given her episode of postmenopausal vaginal bleeding to rule out malignancy ENDOMETRIAL BIOPSY     The indications for endometrial biopsy were reviewed.   Risks of the biopsy including cramping, bleeding, infection, uterine perforation, inadequate  specimen and need for additional procedures  were discussed. The patient states she understands and agrees to undergo procedure today. Consent was signed. Time out was performed. Urine HCG was negative. A sterile speculum was placed in the patient's vagina and the cervix was prepped with Betadine. A single-toothed tenaculum was placed on the anterior lip of the cervix to stabilize it. The uterine cavity was sounded to a depth of 7.5 cm using the uterine sound. The 3 mm pipelle was introduced into the endometrial cavity without difficulty, 2 passes were made.  A  moderate amount of tissue was  sent to pathology. The instruments were removed from the patient's vagina. Minimal bleeding from the cervix was noted. The patient tolerated the procedure well.  Routine post-procedure instructions were given to the patient. The patient will follow up in two weeks to review the results and for further management.   - Polypoid mass was removed with the use of a kelly clamp and sent to pathology  - patient will be contacted with any abnormal results - Advised to keep a menstrual calendar and to return with any further episodes of vaginal bleeding

## 2015-03-07 ENCOUNTER — Telehealth: Payer: Self-pay | Admitting: General Practice

## 2015-03-07 NOTE — Telephone Encounter (Signed)
Patient has been informed of lab results.

## 2015-03-07 NOTE — Telephone Encounter (Signed)
Per Dr Elly Modena, patient had benign polyp and negative endometrial biopsy. Bleeding was likely secondary to the presence of the polyp. If bleeding persists over the next month, she should call and let us know- as she may benefit from [facility]. Called patient, no answer- left message stating we are trying to reach you with non urgent results, please call us back at the clinics

## 2015-11-23 ENCOUNTER — Other Ambulatory Visit: Payer: Self-pay | Admitting: Family Medicine

## 2015-11-23 ENCOUNTER — Encounter: Payer: Self-pay | Admitting: Family Medicine

## 2015-11-23 DIAGNOSIS — I10 Essential (primary) hypertension: Secondary | ICD-10-CM

## 2015-11-24 ENCOUNTER — Other Ambulatory Visit: Payer: Self-pay | Admitting: *Deleted

## 2015-11-24 DIAGNOSIS — I10 Essential (primary) hypertension: Secondary | ICD-10-CM

## 2015-11-24 MED ORDER — AMLODIPINE BESYLATE 10 MG PO TABS: 10.0000 mg | ORAL_TABLET | Freq: Every day | ORAL | 0 refills | Status: DC

## 2015-11-24 NOTE — Telephone Encounter (Signed)
Patient has an appointment to see pcp on 12/13/15 at 8:30pm but is out of her BP medication.  She would like to have this refilled. Beyonka Pitney,CMA

## 2015-12-13 ENCOUNTER — Encounter: Payer: Self-pay | Admitting: Family Medicine

## 2015-12-13 ENCOUNTER — Ambulatory Visit (INDEPENDENT_AMBULATORY_CARE_PROVIDER_SITE_OTHER): Payer: No Typology Code available for payment source | Admitting: Family Medicine

## 2015-12-13 VITALS — BP 118/76 | HR 81 | Temp 98.1°F | Wt 180.4 lb

## 2015-12-13 DIAGNOSIS — M545 Low back pain, unspecified: Secondary | ICD-10-CM

## 2015-12-13 DIAGNOSIS — Z23 Encounter for immunization: Secondary | ICD-10-CM | POA: Diagnosis not present

## 2015-12-13 DIAGNOSIS — F3289 Other specified depressive episodes: Secondary | ICD-10-CM | POA: Diagnosis not present

## 2015-12-13 DIAGNOSIS — Z114 Encounter for screening for human immunodeficiency virus [HIV]: Secondary | ICD-10-CM

## 2015-12-13 DIAGNOSIS — E7800 Pure hypercholesterolemia, unspecified: Secondary | ICD-10-CM

## 2015-12-13 DIAGNOSIS — I10 Essential (primary) hypertension: Secondary | ICD-10-CM | POA: Diagnosis not present

## 2015-12-13 DIAGNOSIS — E119 Type 2 diabetes mellitus without complications: Secondary | ICD-10-CM | POA: Diagnosis not present

## 2015-12-13 LAB — LIPID PANEL
Cholesterol: 206 mg/dL — ABNORMAL HIGH (ref <200)
HDL: 103 mg/dL (ref >50)
LDL Cholesterol: 91 mg/dL
Total CHOL/HDL Ratio: 2 Ratio (ref <5.0)
Triglycerides: 58 mg/dL (ref <150)
VLDL: 12 mg/dL (ref <30)

## 2015-12-13 LAB — POCT URINALYSIS DIPSTICK
Bilirubin, UA: NEGATIVE
Blood, UA: NEGATIVE
Glucose, UA: NEGATIVE
Ketones, UA: NEGATIVE
Nitrite, UA: NEGATIVE
Spec Grav, UA: 1.015
Urobilinogen, UA: 1
pH, UA: 7.5

## 2015-12-13 LAB — COMPLETE METABOLIC PANEL WITH GFR
ALT: 10 U/L (ref 6–29)
AST: 17 U/L (ref 10–35)
Albumin: 4.3 g/dL (ref 3.6–5.1)
Alkaline Phosphatase: 60 U/L (ref 33–115)
BUN: 10 mg/dL (ref 7–25)
CO2: 27 mmol/L (ref 20–31)
Calcium: 9.2 mg/dL (ref 8.6–10.2)
Chloride: 103 mmol/L (ref 98–110)
Creat: 0.61 mg/dL (ref 0.50–1.10)
GFR, Est African American: >89 mL/min (ref >=60)
GFR, Est Non African American: >89 mL/min (ref >=60)
Glucose, Bld: 133 mg/dL — ABNORMAL HIGH (ref 65–99)
Potassium: 4 mmol/L (ref 3.5–5.3)
Sodium: 142 mmol/L (ref 135–146)
Total Bilirubin: 0.4 mg/dL (ref 0.2–1.2)
Total Protein: 7.8 g/dL (ref 6.1–8.1)

## 2015-12-13 LAB — POCT GLYCOSYLATED HEMOGLOBIN (HGB A1C): Hemoglobin A1C: 6.7

## 2015-12-13 LAB — POCT UA - MICROSCOPIC ONLY: WBC, Ur, HPF, POC: >20

## 2015-12-13 MED ORDER — CEPHALEXIN 500 MG PO CAPS: 500.0000 mg | ORAL_CAPSULE | Freq: Two times a day (BID) | ORAL | 0 refills | Status: DC

## 2015-12-13 NOTE — Assessment & Plan Note (Signed)
Not taking statin.  Unsure of control.  Check labs

## 2015-12-13 NOTE — Assessment & Plan Note (Signed)
She feels is under good control

## 2015-12-13 NOTE — Assessment & Plan Note (Signed)
BP Readings from Last 3 Encounters:  12/13/15 118/76  03/02/15 130/76  07/06/14 (!) 132/91   Controlled continue CCB.

## 2015-12-13 NOTE — Assessment & Plan Note (Addendum)
Well controlled.  May need B12 in future

## 2015-12-13 NOTE — Progress Notes (Signed)
Subjective  Patient is presenting with the following illnesses  Low Back Fullness-Pain Better after she urinates.  Similar symptoms with UTI before.  No fever or nausea and vomiting.  No dysuria or frequency   HYPERTENSION Disease Monitoring: Blood pressure range-not checking Chest pain, palpitations- no      Dyspnea- no Medications: Compliance- amlodipine Lightheadedness,Syncope- no   Edema- no  DIABETES Disease Monitoring: Blood Sugar ranges-not checking Polyuria/phagia/dipsia- no      Visual problems- no Medications: Compliance- metformin Hypoglycemic symptoms- no  HYPERLIPIDEMIA Disease Monitoring: See symptoms for Hypertension Medications: Compliance- NOT taking lipitor.  Thought it made her legs swell but not really sure stopped a while ago Right upper quadrant pain- no  Muscle aches- no  Monitoring Labs and Parameters Last A1C:  Lab Results  Component Value Date   HGBA1C 6.7 12/13/2015    Last Lipid:     Component Value Date/Time   CHOL 213 (H) 01/12/2013 1156   HDL 86 01/12/2013 1156   LDLDIRECT 117 (H) 04/04/2010 1953    Last Bmet  Potassium  Date Value Ref Range Status  01/12/2013 3.6 3.5 - 5.3 mEq/L Final   Sodium  Date Value Ref Range Status  01/12/2013 138 135 - 145 mEq/L Final   Creat  Date Value Ref Range Status  01/12/2013 0.70 0.50 - 1.10 mg/dL Final      Last BPs:  BP Readings from Last 3 Encounters:  12/13/15 118/76  03/02/15 130/76  07/06/14 (!) 132/91        Chief Complaint noted Review of Symptoms - see HPI PMH - Smoking status noted.     Objective Vital Signs reviewed Alert nad Heart - Regular rate and rhythm.  No murmurs, gallops or rubs.    Lungs:  Normal respiratory effort, chest expands symmetrically. Lungs are clear to auscultation, no crackles or wheezes. Diabetic Foot Check -  Appearance - no lesions, ulcers or calluses Skin - no unusual pallor or redness Monofilament testing -  Right - Great toe, medial,  central, lateral ball and posterior foot intact Left - Great toe, medial, central, lateral ball and posterior foot intact No CVAT    Assessments/Plans  No problem-specific Assessment & Plan notes found for this encounter.  Low Back Fullness - UA consistent with UTI.  No systemic signs.  Treat with keflex    See Encounter view if individual problem A/Ps not visible See after visit summary for details of patient instuctions

## 2015-12-13 NOTE — Patient Instructions (Addendum)
Good to see you  I will call you if your tests are not good.  Otherwise I will send you a letter.  If you do not hear from me with in 2 weeks please call our office.     If your back pain is not better in 2-3 days or if fever or worsening back pain call us  Please see your eye doctor to check for diabetes damage and ask them to send [place] a report  Come back in 6 months for a recheck

## 2015-12-14 ENCOUNTER — Encounter: Payer: Self-pay | Admitting: Family Medicine

## 2015-12-14 LAB — MICROALBUMIN / CREATININE URINE RATIO
Creatinine, Urine: 194 mg/dL (ref 20–320)
Microalb Creat Ratio: 6 mcg/mg creat (ref <30)
Microalb, Ur: 1.2 mg/dL

## 2015-12-14 LAB — HIV ANTIBODY (ROUTINE TESTING W REFLEX): HIV 1&2 Ab, 4th Generation: NONREACTIVE

## 2015-12-17 ENCOUNTER — Encounter: Payer: Self-pay | Admitting: Family Medicine

## 2016-01-12 ENCOUNTER — Ambulatory Visit: Payer: No Typology Code available for payment source | Admitting: Family Medicine

## 2016-01-12 ENCOUNTER — Encounter: Payer: Self-pay | Admitting: Student

## 2016-01-12 ENCOUNTER — Ambulatory Visit (INDEPENDENT_AMBULATORY_CARE_PROVIDER_SITE_OTHER): Payer: Self-pay | Admitting: Student

## 2016-01-12 VITALS — BP 124/80 | HR 86 | Temp 98.0°F | Ht 64.0 in | Wt 181.8 lb

## 2016-01-12 DIAGNOSIS — M549 Dorsalgia, unspecified: Secondary | ICD-10-CM | POA: Insufficient documentation

## 2016-01-12 DIAGNOSIS — R300 Dysuria: Secondary | ICD-10-CM

## 2016-01-12 LAB — POCT URINALYSIS DIPSTICK
Bilirubin, UA: NEGATIVE
Blood, UA: NEGATIVE
Glucose, UA: NEGATIVE
Ketones, UA: NEGATIVE
Nitrite, UA: NEGATIVE
Protein, UA: NEGATIVE
Spec Grav, UA: 1.02
Urobilinogen, UA: 0.2
pH, UA: 6

## 2016-01-12 LAB — POCT UA - MICROSCOPIC ONLY

## 2016-01-12 NOTE — Progress Notes (Signed)
   Subjective:    Patient ID: Colleen Ford, female    DOB: Jul 24, 1965, 50 y.o.   MRN: [id number]   CC: concern for UTI  HPI: 50 y/o f presents for concern for UTI  UTI - treated with keflex on 11/8 - no dysuria - no abd pain, fevers -  no vaginal irritation, foul smelling discharge - has had right sided lower back pain  Back pain - started after falling at work - denies weakness - works to break down windows - her back pain has not improved but she has not taken any thing for it  Smoking status reviewed  Review of Systems  Per HPI, else denies chest pain, shortness of breath, abdominal pain, N/V/D    Objective:  BP 124/80 (BP Location: Left Arm, Patient Position: Sitting, Cuff Size: Normal)   Pulse 86   Temp 98 F (36.7 C) (Oral)   Ht 5\' 4"  (1.626 m)   Wt 181 lb 12.8 oz (82.5 kg)   SpO2 99%   BMI 31.21 kg/m  Vitals and nursing note reviewed  General: NAD Cardiac: RRR Respiratory: CTAB, normal effort Abdomen: soft, nontender, nondistended MSK: no CVA tenderness, mild tenderness to palpation over right lower paraspinous muscles no bony tenderness, no overlying rash or skin changes, neg straight leg test bilaterally Skin: warm and dry, no rashes noted Neuro: alert and oriented, no focal deficits   Assessment & Plan:    Back pain Back pain consistent with MSK pain , no bony pain or red flag symptoms - will treat conservatively with OTC anti inflammatories, heat and exercise - follow PRN  Concern for UTI - UA negative, no evidence of UTI   Alyssa A. Lincoln Brigham MD, [facility] Resident PGY-3 Pager [phone]

## 2016-01-12 NOTE — Patient Instructions (Signed)
Follow up with PCP Use NSAIDs and tylenol PRN back pain as well as heating pad If your symptoms worsen, call the office for evaluation   Back Pain, Adult Introduction Back pain is very common. The pain often gets better over time. The cause of back pain is usually not dangerous. Most people can learn to manage their back pain on their own. Follow these instructions at home: Watch your back pain for any changes. The following actions may help to lessen any pain you are feeling:  Stay active. Start with short walks on flat ground if you can. Try to walk farther each day.  Exercise regularly as told by your doctor. Exercise helps your back heal faster. It also helps avoid future injury by keeping your muscles strong and flexible.  Do not sit, drive, or stand in one place for more than 30 minutes.  Do not stay in bed. Resting more than 1-2 days can slow down your recovery.  Be careful when you bend or lift an object. Use good form when lifting:  Bend at your knees.  Keep the object close to your body.  Do not twist.  Sleep on a firm mattress. Lie on your side, and bend your knees. If you lie on your back, put a pillow under your knees.  Take medicines only as told by your doctor.  Put ice on the injured area.  Put ice in a plastic bag.  Place a towel between your skin and the bag.  Leave the ice on for 20 minutes, 2-3 times a day for the first 2-3 days. After that, you can switch between ice and heat packs.  Avoid feeling anxious or stressed. Find good ways to deal with stress, such as exercise.  Maintain a healthy weight. Extra weight puts stress on your back. Contact a doctor if:  You have pain that does not go away with rest or medicine.  You have worsening pain that goes down into your legs or buttocks.  You have pain that does not get better in one week.  You have pain at night.  You lose weight.  You have a fever or chills. Get help right away if:  You  cannot control when you poop (bowel movement) or pee (urinate).  Your arms or legs feel weak.  Your arms or legs lose feeling (numbness).  You feel sick to your stomach (nauseous) or throw up (vomit).  You have belly (abdominal) pain.  You feel like you may pass out (faint). This information is not intended to replace advice given to you by your health care provider. Make sure you discuss any questions you have with your health care provider. Document Released: 07/10/2007 Document Revised: 06/29/2015 Document Reviewed: 05/25/2013  2017 Elsevier

## 2016-01-12 NOTE — Assessment & Plan Note (Signed)
Back pain consistent with MSK pain , no bony pain or red flag symptoms - will treat conservatively with OTC anti inflammatories, heat and exercise - follow PRN

## 2016-02-02 ENCOUNTER — Encounter: Payer: Self-pay | Admitting: Family Medicine

## 2016-02-02 DIAGNOSIS — E119 Type 2 diabetes mellitus without complications: Secondary | ICD-10-CM

## 2016-02-08 MED ORDER — GLUCOSE BLOOD VI STRP: 1.0000 | ORAL_STRIP | Freq: Every day | 3 refills | Status: DC

## 2016-03-01 ENCOUNTER — Encounter: Payer: Self-pay | Admitting: Family Medicine

## 2016-03-01 DIAGNOSIS — I10 Essential (primary) hypertension: Secondary | ICD-10-CM

## 2016-03-04 MED ORDER — AMLODIPINE BESYLATE 10 MG PO TABS: 10.0000 mg | ORAL_TABLET | Freq: Every day | ORAL | 2 refills | Status: DC

## 2016-03-04 MED ORDER — METFORMIN HCL 1000 MG PO TABS: 1000.0000 mg | ORAL_TABLET | Freq: Two times a day (BID) | ORAL | 2 refills | Status: DC

## 2016-03-07 ENCOUNTER — Encounter: Payer: Self-pay | Admitting: Family Medicine

## 2016-06-18 ENCOUNTER — Telehealth: Payer: Self-pay | Admitting: Family Medicine

## 2016-06-18 NOTE — Telephone Encounter (Signed)
Pt doesn't have insurance and would like to call back to sched AWV when she's insured. - Colleen Ford

## 2016-07-25 ENCOUNTER — Encounter: Payer: Self-pay | Admitting: Family Medicine

## 2016-08-28 ENCOUNTER — Ambulatory Visit (INDEPENDENT_AMBULATORY_CARE_PROVIDER_SITE_OTHER): Payer: Self-pay | Admitting: Family Medicine

## 2016-08-28 ENCOUNTER — Encounter: Payer: Self-pay | Admitting: Family Medicine

## 2016-08-28 VITALS — BP 140/82 | HR 73 | Temp 98.0°F | Ht 64.0 in | Wt 180.0 lb

## 2016-08-28 DIAGNOSIS — N950 Postmenopausal bleeding: Secondary | ICD-10-CM

## 2016-08-28 DIAGNOSIS — R5383 Other fatigue: Secondary | ICD-10-CM | POA: Insufficient documentation

## 2016-08-28 DIAGNOSIS — I10 Essential (primary) hypertension: Secondary | ICD-10-CM

## 2016-08-28 DIAGNOSIS — E119 Type 2 diabetes mellitus without complications: Secondary | ICD-10-CM

## 2016-08-28 LAB — POCT GLYCOSYLATED HEMOGLOBIN (HGB A1C): Hemoglobin A1C: 6.5

## 2016-08-28 NOTE — Assessment & Plan Note (Signed)
Good control continue current regimen

## 2016-08-28 NOTE — Assessment & Plan Note (Signed)
BP Readings from Last 3 Encounters:  08/28/16 140/82  01/12/16 124/80  12/13/15 118/76   At goal

## 2016-08-28 NOTE — Assessment & Plan Note (Signed)
Given vaginal bleeding will check labs and follow up

## 2016-08-28 NOTE — Assessment & Plan Note (Signed)
Given postmenopausal status will check [place].  If show thick stripe will need Endometrial bx.  If not will follow up with pelvic exam.  Check labs

## 2016-08-28 NOTE — Progress Notes (Signed)
Subjective  Patient is presenting with the following illnesses  VAGINAL BLEEDING Had 6 days of scant menstrual period like bleeding the end of June.  Last period to this was 2 years ago.  Reports had an endometrial bx several years ago.  No bleeding elsewhere.  No vaginal discharge or lesions   DIABETES Disease Monitoring: Blood Sugar ranges(Severity) -not checking  Associated Symptoms- Polyuria/phagia/dipsia- no      Visual problems- no Medications: Compliance(Modifying factor) - sometimes forgets metformin second dose Hypoglycemic symptoms- no Timing - continuous  FATIGUE Intermittently tired after work.  No weight loss or chest pain or shortness of breath or lightheadness or fever.   Monitoring Labs and Parameters Last A1C:  Lab Results  Component Value Date   HGBA1C 6.5 08/28/2016   Last Lipid:     Component Value Date/Time   CHOL 206 (H) 12/13/2015 0939   HDL 103 12/13/2015 0939   LDLDIRECT 117 (H) 04/04/2010 1953   Last Bmet  Potassium  Date Value Ref Range Status  12/13/2015 4.0 3.5 - 5.3 mmol/L Final   Sodium  Date Value Ref Range Status  12/13/2015 142 135 - 146 mmol/L Final   Creat  Date Value Ref Range Status  12/13/2015 0.61 0.50 - 1.10 mg/dL Final            Chief Complaint noted Review of Symptoms - see HPI PMH - Smoking status noted.     Objective Vital Signs reviewed Alert nad     Assessments/Plans  No problem-specific Assessment & Plan notes found for this encounter.   See Encounter view if individual problem A/Ps not visible See after visit summary for details of patient instuctions

## 2016-08-28 NOTE — Patient Instructions (Addendum)
Good to see you today!  Thanks for coming in.  I will call you if your tests are not good.  Otherwise I will send you a letter.  If you do not hear from me with in 2 weeks please call our office.     Your diabetes is doing well  Schedule the [place] to look at the cause of bleeding.    You need a mammogram and a colonoscopy   See our financial counselor about a discount plan   I will be in touch after the [place].

## 2016-08-29 LAB — CMP14+EGFR
ALT: 14 IU/L (ref 0–32)
AST: 20 IU/L (ref 0–40)
Albumin/Globulin Ratio: 1.2 (ref 1.2–2.2)
Albumin: 4.4 g/dL (ref 3.5–5.5)
Alkaline Phosphatase: 79 IU/L (ref 39–117)
BUN/Creatinine Ratio: 19 (ref 9–23)
BUN: 10 mg/dL (ref 6–24)
Bilirubin Total: 0.3 mg/dL (ref 0.0–1.2)
CO2: 25 mmol/L (ref 20–29)
Calcium: 9.2 mg/dL (ref 8.7–10.2)
Chloride: 104 mmol/L (ref 96–106)
Creatinine, Ser: 0.53 mg/dL — ABNORMAL LOW (ref 0.57–1.00)
GFR calc Af Amer: 128 mL/min/1.73 (ref >59)
GFR calc non Af Amer: 111 mL/min/1.73 (ref >59)
Globulin, Total: 3.6 g/dL (ref 1.5–4.5)
Glucose: 120 mg/dL — ABNORMAL HIGH (ref 65–99)
Potassium: 4.3 mmol/L (ref 3.5–5.2)
Sodium: 143 mmol/L (ref 134–144)
Total Protein: 8 g/dL (ref 6.0–8.5)

## 2016-08-29 LAB — CBC
Hematocrit: 32.5 % — ABNORMAL LOW (ref 34.0–46.6)
Hemoglobin: 10.2 g/dL — ABNORMAL LOW (ref 11.1–15.9)
MCH: 21.8 pg — ABNORMAL LOW (ref 26.6–33.0)
MCHC: 31.4 g/dL — ABNORMAL LOW (ref 31.5–35.7)
MCV: 69 fL — ABNORMAL LOW (ref 79–97)
Platelets: 240 x10E3/uL (ref 150–379)
RBC: 4.68 x10E6/uL (ref 3.77–5.28)
RDW: 17.9 % — ABNORMAL HIGH (ref 12.3–15.4)
WBC: 5.8 x10E3/uL (ref 3.4–10.8)

## 2016-08-29 LAB — TSH: TSH: 1.31 u[IU]/mL (ref 0.450–4.500)

## 2016-08-30 ENCOUNTER — Encounter: Payer: Self-pay | Admitting: Family Medicine

## 2016-09-06 ENCOUNTER — Encounter (HOSPITAL_COMMUNITY): Payer: Self-pay | Admitting: Radiology

## 2016-09-06 ENCOUNTER — Ambulatory Visit (HOSPITAL_COMMUNITY)
Admission: RE | Admit: 2016-09-06 | Discharge: 2016-09-06 | Disposition: A | Discharge status: Home / Self Care | Payer: Self-pay | Source: Ambulatory Visit | Attending: Family Medicine | Admitting: Family Medicine

## 2016-09-06 DIAGNOSIS — D259 Leiomyoma of uterus, unspecified: Secondary | ICD-10-CM | POA: Insufficient documentation

## 2016-09-06 DIAGNOSIS — N888 Other specified noninflammatory disorders of cervix uteri: Secondary | ICD-10-CM | POA: Insufficient documentation

## 2016-09-06 DIAGNOSIS — N950 Postmenopausal bleeding: Secondary | ICD-10-CM

## 2016-09-12 ENCOUNTER — Encounter: Payer: Self-pay | Admitting: Family Medicine

## 2016-09-19 ENCOUNTER — Telehealth: Payer: Self-pay | Admitting: Family Medicine

## 2016-09-19 NOTE — Telephone Encounter (Signed)
Called her and recommend an endometrial bx  She agrees  Will schedule

## 2016-09-23 IMAGING — US US TRANSVAGINAL NON-OB
1 series · 14 of 25 positions shown · non-contrast
Comparison: None

CLINICAL DATA: Postmenopausal bleeding.

EXAM:
TRANSABDOMINAL AND TRANSVAGINAL ULTRASOUND OF PELVIS
TECHNIQUE: Both transabdominal and transvaginal ultrasound examinations of the
pelvis were performed. Transabdominal technique was performed for
global imaging of the pelvis including uterus, ovaries, adnexal
regions, and pelvic cul-de-sac. It was necessary to proceed with
endovaginal exam following the transabdominal exam to visualize the
uterus and ovaries.

[Series 1: us transvaginal non-ob · 0.21mm/px · 73 acquisitions, 14 frames shown]
[im 1/73]
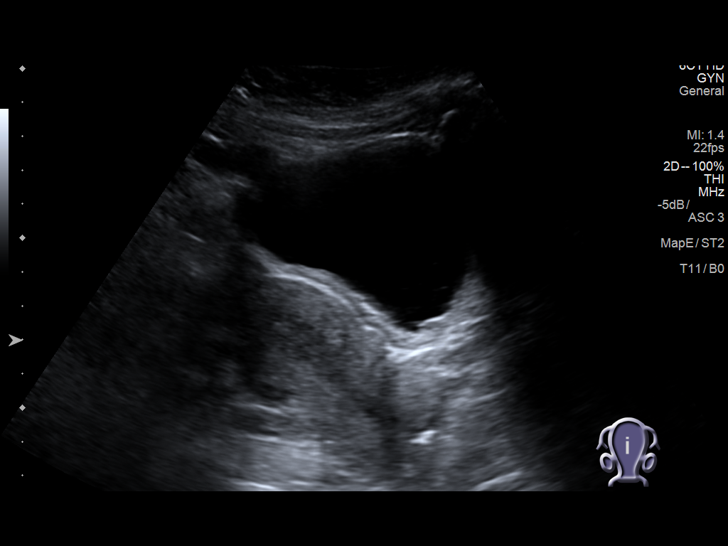
[im 7/73]
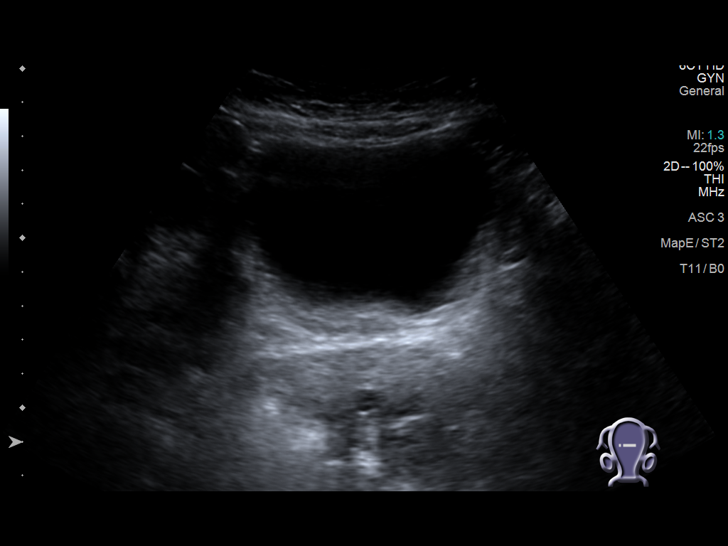
[im 13/73]
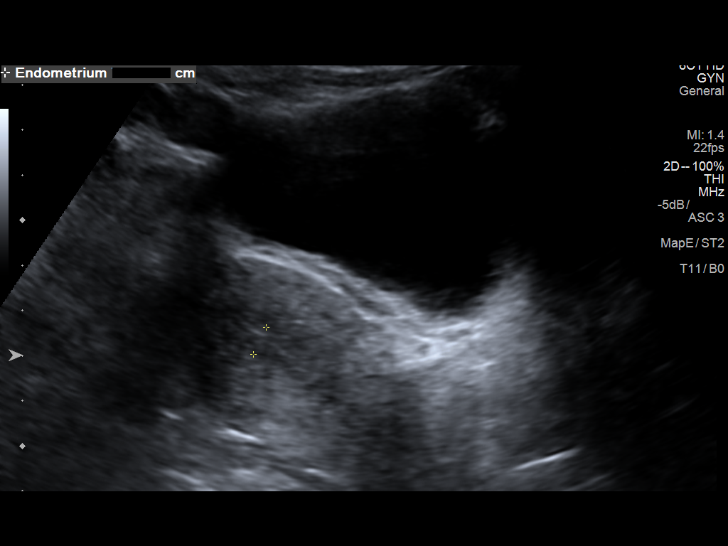
[im 19/73]
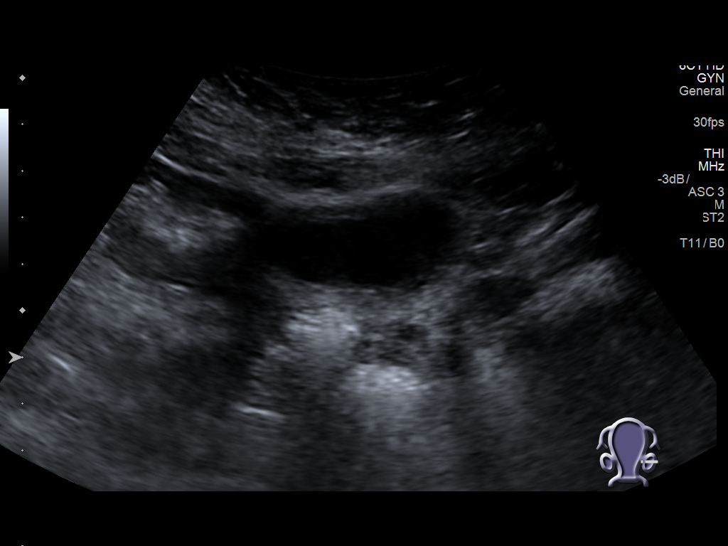
[im 25/73]
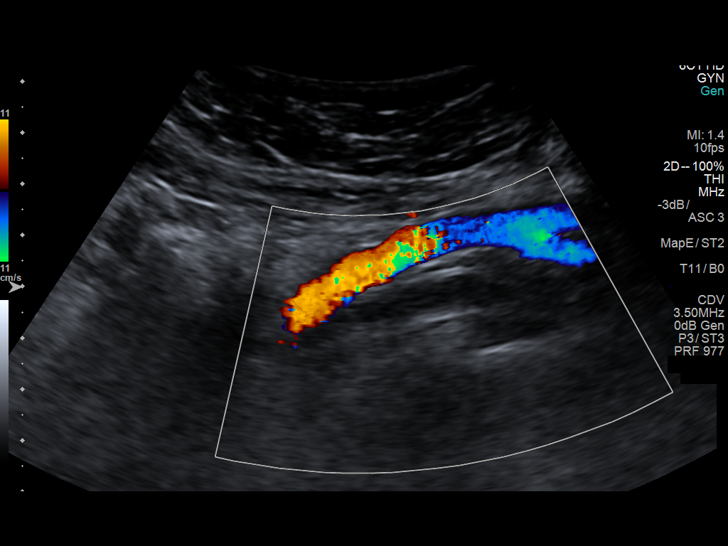
[im 28/73]
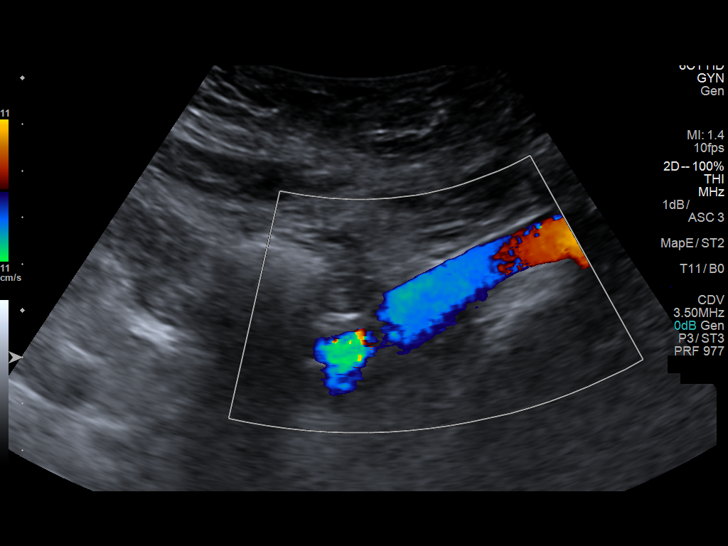
[im 34/73]
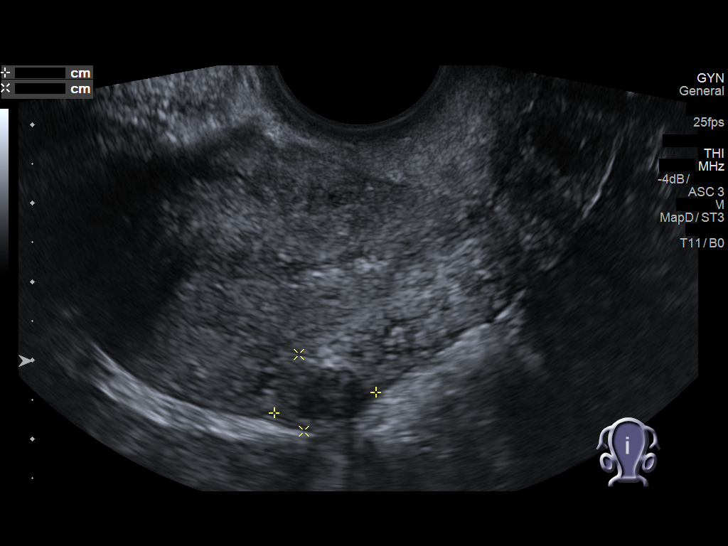
[im 40/73]
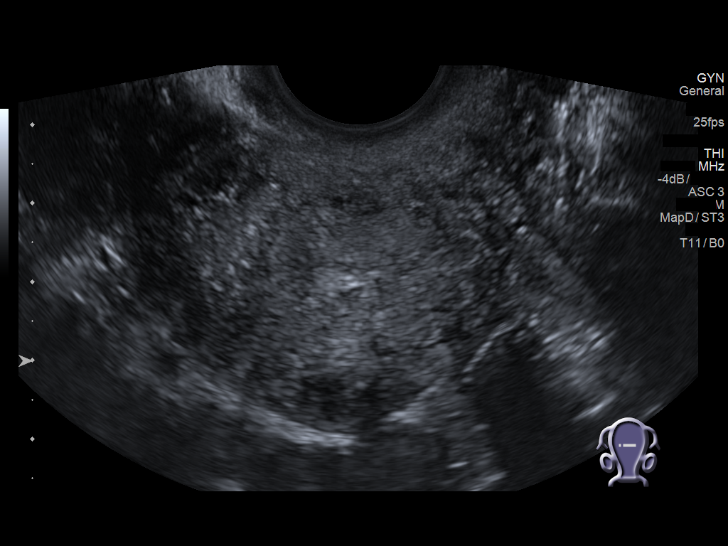
[im 46/73]
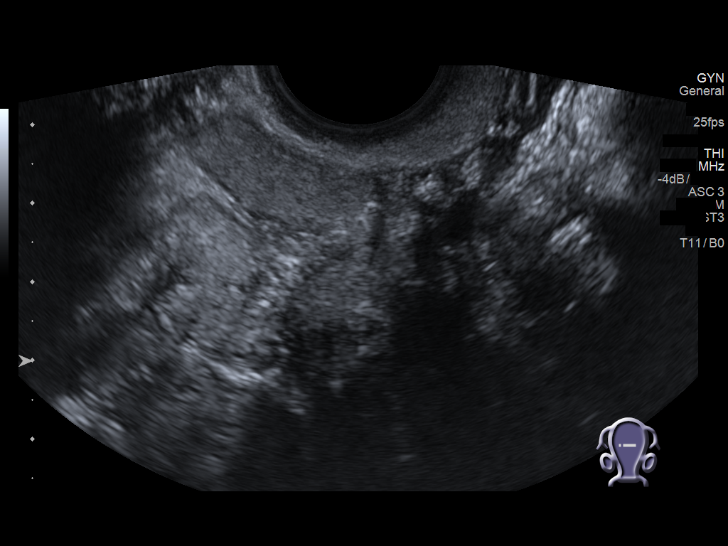
[im 49/73]
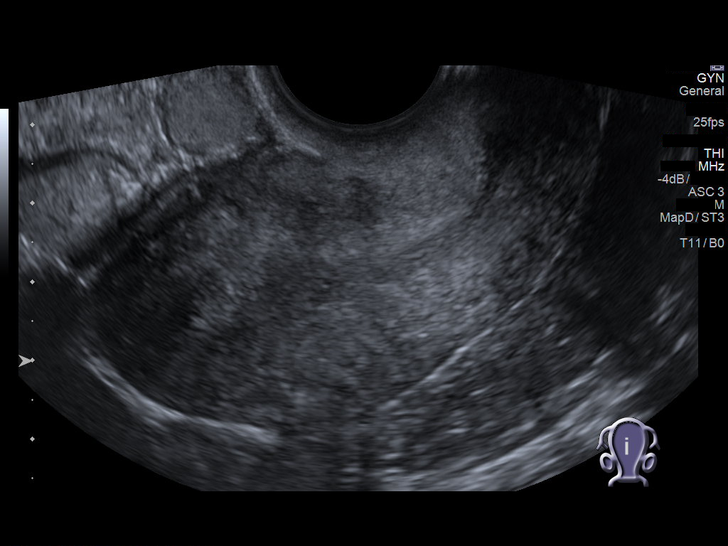
[im 55/73]
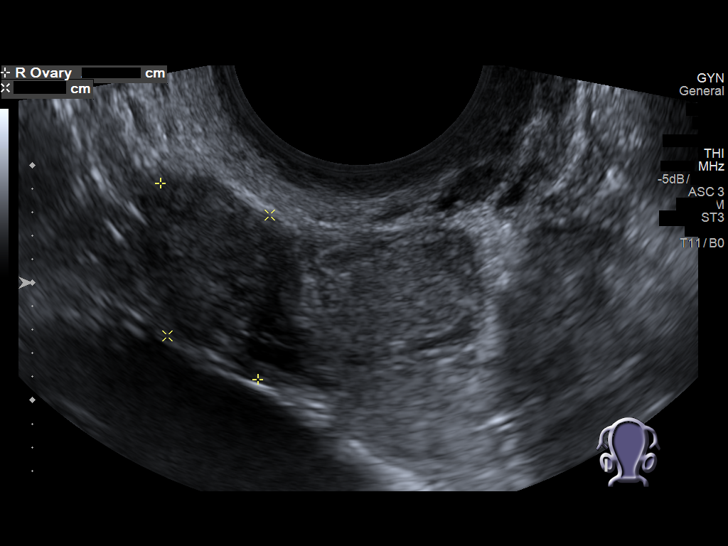
[im 61/73]
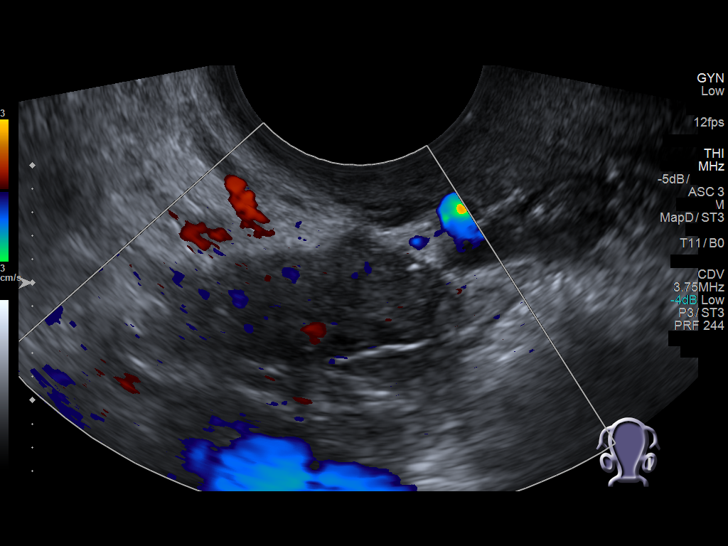
[im 67/73]
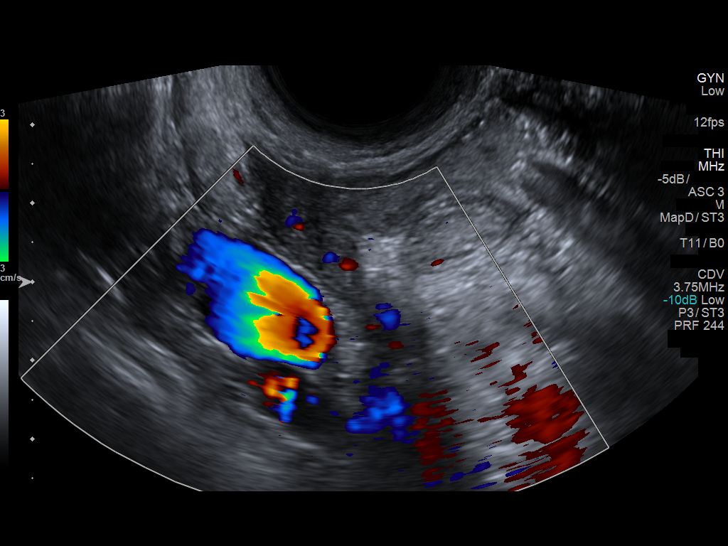
[im 73/73]
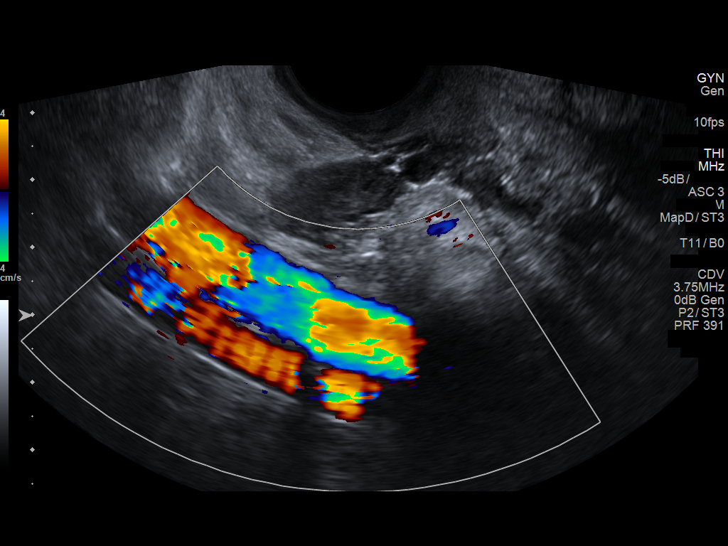

[14 of 25 positions shown; findings below may reference images not displayed]

FINDINGS: Uterus

Measurements: 7.9 x 4.1 x 4.5 cm. No fibroids or other mass
visualized. Nabothian cysts noted in the cervix.

Endometrium

Thickness: 5 mm.  1.3 cm and 1.4 cm uterine fibroids .

Right ovary

Measurements: 1.9 x 1.2 x 1.7 cm. Normal appearance/no adnexal mass.

Left ovary

Measurements: 1.9 x 1.4 x 1.3 cm. Normal appearance/no adnexal mass.

Other findings

No abnormal free fluid.
IMPRESSION: Small uterine fibroids.  Exam otherwise unremarkable.

## 2016-10-11 NOTE — Telephone Encounter (Signed)
Called her Scheduled for colpo clinic 9/27 at 1130 Asked to take 3 ibuprofen 2 hours before the procedure She agrees

## 2016-10-31 ENCOUNTER — Ambulatory Visit (INDEPENDENT_AMBULATORY_CARE_PROVIDER_SITE_OTHER): Payer: Self-pay | Admitting: Family Medicine

## 2016-10-31 ENCOUNTER — Other Ambulatory Visit (HOSPITAL_COMMUNITY)
Admission: RE | Admit: 2016-10-31 | Discharge: 2016-10-31 | Disposition: A | Discharge status: Home / Self Care | Payer: Self-pay | Source: Ambulatory Visit | Attending: Family Medicine | Admitting: Family Medicine

## 2016-10-31 VITALS — BP 150/82 | HR 75 | Temp 98.2°F | Ht 64.0 in | Wt 180.6 lb

## 2016-10-31 DIAGNOSIS — Z01411 Encounter for gynecological examination (general) (routine) with abnormal findings: Secondary | ICD-10-CM

## 2016-10-31 DIAGNOSIS — Z124 Encounter for screening for malignant neoplasm of cervix: Secondary | ICD-10-CM | POA: Insufficient documentation

## 2016-10-31 DIAGNOSIS — Z780 Asymptomatic menopausal state: Secondary | ICD-10-CM | POA: Insufficient documentation

## 2016-10-31 DIAGNOSIS — N950 Postmenopausal bleeding: Secondary | ICD-10-CM

## 2016-10-31 NOTE — Patient Instructions (Signed)
For your endometrial biopsy:  -You can expect some bleeding after your procedure which is normal but as we discussed, if you notice excessive bleeding I would like you to call the clinic and let us know.  -You can also expect some pain following the procedure which should go away within a few hours.  You can take Tylenol for discomfort.  -We will call you with the results of your Pap smear.  Please call clinic if you have any questions.      Endometrial Biopsy, Care After This sheet gives you information about how to care for yourself after your procedure. Your health care provider may also give you more specific instructions. If you have problems or questions, contact your health care provider. What can I expect after the procedure? After the procedure, it is common to have:  Mild cramping.  A small amount of vaginal bleeding for a few days. This is normal.  Follow these instructions at home:  Take over-the-counter and prescription medicines only as told by your health care provider.  Do not douche, use tampons, or have sexual intercourse until your health care provider approves.  Return to your normal activities as told by your health care provider. Ask your health care provider what activities are safe for you.  Follow instructions from your health care provider about any activity restrictions, such as restrictions on strenuous exercise or heavy lifting. Contact a health care provider if:  You have heavy bleeding, or bleed for longer than 2 days after the procedure.  You have bad smelling discharge from your vagina.  You have a fever or chills.  You have a burning sensation when urinating or you have difficulty urinating.  You have severe pain in your lower abdomen. Get help right away if:  You have severe cramps in your stomach or back.  You pass large blood clots.  Your bleeding increases.  You become weak or light-headed, or you pass out. Summary  After the  procedure, it is common to have mild cramping and a small amount of vaginal bleeding for a few days.  Do not douche, use tampons, or have sexual intercourse until your health care provider approves.  Return to your normal activities as told by your health care provider. Ask your health care provider what activities are safe for you. This information is not intended to replace advice given to you by your health care provider. Make sure you discuss any questions you have with your health care provider. Document Released: 11/11/2012 Document Revised: 02/07/2016 Document Reviewed: 02/07/2016 Elsevier Interactive Patient Education  2017 [employer].

## 2016-10-31 NOTE — Progress Notes (Signed)
   Subjective:   Patient ID: Colleen Ford    DOB: [birth year], 51 y.o. female   MRN: [id number]  CC: pap and endometrial biopsy  HPI: Colleen Ford is a 51 y.o. female who presents to clinic today for procedural visit.  PCP referred for endometrial biopsy today to evaluate for dysfunctional uterine bleeding.  Recent ultrasound showed fibroids and normal endometrium thickness of 5 mm.    Pt has had an endometrial biopsy done before for polyps.   Denies vaginal bleeding.  Denies abdominal pain or cramping.     ROS: See HPI for pertinent ROS. Anna: Pertinent past medical, surgical, family, and social history were reviewed and updated as appropriate. Smoking status reviewed. Medications reviewed.  Objective:   BP (!) 150/82 (BP Location: Left Arm, Patient Position: Sitting, Cuff Size: Normal) Comment: Did not take BP meds this morning  Pulse 75   Temp 98.2 F (36.8 C) (Oral)   Ht 5\' 4"  (1.626 m)   Wt 180 lb 9.6 oz (81.9 kg)   LMP 12/31/2013   SpO2 96%   BMI 31.00 kg/m  Vitals and nursing note reviewed.  General: 51 yo female, NAD  Lungs: non-labored  Pelvic exam: normal external genitalia, vulva, vagina, cervix, uterus and adnexa.  No discharge or blood noted in vaginal vault.  Cervical os visualized.  No cervical lesions.  Minimal bleeding with procedure.   Skin: warm, dry, no rashes or lesions  Extremities: warm and well perfused, normal tone  Assessment & Plan:   Post-menopausal bleeding Endometrial biopsy performed in clinic today, please see below for procedure note.  Pap smear also performed today.  Will follow up with results of surgical pathology and Pap.   ENDOMETRIAL BIOPSY     The indications for endometrial biopsy were reviewed.   Risks of the biopsy including cramping, bleeding, infection, uterine perforation, inadequate specimen and need for additional procedures  were discussed. The patient states she understands and agrees to undergo procedure today.  Consent was signed. Time out was performed. Urine HCG was negative. During the pelvic exam, the cervix was prepped with Betadine. A single-toothed tenaculum was placed on the anterior lip of the cervix to stabilize it. The 3 mm pipelle was introduced into the endometrial cavity without difficulty to a depth of 6 cm, and a moderate amount of tissue was obtained and sent to pathology. The instruments were removed from the patient's vagina. Minimal bleeding from the cervix was noted. The patient tolerated the procedure well. Routine post-procedure instructions were given to the patient.      Lovenia Kim, MD [facility], PGY-2 10/31/2016 12:17 PM

## 2016-10-31 NOTE — Assessment & Plan Note (Signed)
Endometrial biopsy performed in clinic today, please see below for procedure note.  Pap smear also performed today.  Will follow up with results of surgical pathology and Pap.

## 2016-11-02 ENCOUNTER — Telehealth (HOSPITAL_COMMUNITY): Payer: Self-pay | Admitting: Family Medicine

## 2016-11-02 NOTE — Telephone Encounter (Signed)
I called and discussed endometrial biopsy result with patient. As discussed with her, if bleeding persist, please repeat US and she might need Gyn referral. She stated she feels the polyps inside of her and might want to consider hysterectomy. She will discuss this with her PCP.

## 2016-11-04 ENCOUNTER — Encounter: Payer: Self-pay | Admitting: *Deleted

## 2016-11-04 LAB — CYTOLOGY - PAP
Diagnosis: NEGATIVE
HPV: NOT DETECTED

## 2017-01-03 ENCOUNTER — Other Ambulatory Visit: Payer: Self-pay | Admitting: Family Medicine

## 2017-01-03 DIAGNOSIS — I10 Essential (primary) hypertension: Secondary | ICD-10-CM

## 2017-02-11 ENCOUNTER — Ambulatory Visit (INDEPENDENT_AMBULATORY_CARE_PROVIDER_SITE_OTHER): Payer: BLUE CROSS/BLUE SHIELD | Admitting: Family Medicine

## 2017-02-11 ENCOUNTER — Other Ambulatory Visit: Payer: Self-pay

## 2017-02-11 ENCOUNTER — Encounter: Payer: Self-pay | Admitting: Family Medicine

## 2017-02-11 VITALS — BP 178/82 | HR 84 | Temp 98.3°F | Wt 182.4 lb

## 2017-02-11 DIAGNOSIS — E119 Type 2 diabetes mellitus without complications: Secondary | ICD-10-CM | POA: Diagnosis not present

## 2017-02-11 DIAGNOSIS — I10 Essential (primary) hypertension: Secondary | ICD-10-CM | POA: Diagnosis not present

## 2017-02-11 DIAGNOSIS — Z1211 Encounter for screening for malignant neoplasm of colon: Secondary | ICD-10-CM

## 2017-02-11 DIAGNOSIS — Z1231 Encounter for screening mammogram for malignant neoplasm of breast: Secondary | ICD-10-CM

## 2017-02-11 DIAGNOSIS — Z23 Encounter for immunization: Secondary | ICD-10-CM

## 2017-02-11 DIAGNOSIS — N898 Other specified noninflammatory disorders of vagina: Secondary | ICD-10-CM | POA: Diagnosis not present

## 2017-02-11 LAB — POCT GLYCOSYLATED HEMOGLOBIN (HGB A1C): Hemoglobin A1C: 7.4

## 2017-02-11 NOTE — Assessment & Plan Note (Signed)
Stable. Continue current meds.   

## 2017-02-11 NOTE — Progress Notes (Signed)
Subjective  Patient is presenting with the following illnesses  VAGINAL DISCHARGE  Having vaginal discharge after she has sex. Discharge consistency: lumpy Discharge color: clear Medications tried: using regular condoms to which she may be allergic  Recent antibiotic use: no Sex in last month: yes Possible STD exposure:no  Symptoms Fever: no Dysuria:no Vaginal bleeding: no Abdomen or Pelvic pain:  no Back pain: no Genital sores or ulcers:no Rash: no Pain during sex: no Missed menstrual period: no post menopausal  ROS see HPI Smoking Status noted  DIABETES Disease Monitoring: Blood Sugar ranges(Severity) -not checking  Associated Symptoms- Polyuria/phagia/dipsia- no      Visual problems- no Medications: Compliance(Modifying factor) - daily Hypoglycemic symptoms- no Timing - continuous  Monitoring Labs and Parameters Last A1C:  Lab Results  Component Value Date   HGBA1C 7.4 02/11/2017   Last Lipid:     Component Value Date/Time   CHOL 206 (H) 12/13/2015 0939   HDL 103 12/13/2015 0939   LDLDIRECT 117 (H) 04/04/2010 1953   Last Bmet  Potassium  Date Value Ref Range Status  08/28/2016 4.3 3.5 - 5.2 mmol/L Final   Sodium  Date Value Ref Range Status  08/28/2016 143 134 - 144 mmol/L Final   Creat  Date Value Ref Range Status  12/13/2015 0.61 0.50 - 1.10 mg/dL Final   Creatinine, Ser  Date Value Ref Range Status  08/28/2016 0.53 (L) 0.57 - 1.00 mg/dL Final         HYPERTENSION Disease Monitoring  Home BP Monitoring (Severity) not checking  Symptoms - Chest pain- no    Dyspnea- no Medications (Modifying factors) Compliance-  Did not take her amlodipine this am . Lightheadedness-  no  Edema- no Timing - continuous  Duration - years ROS - See HPI  PMH Lab Review   Potassium  Date Value Ref Range Status  08/28/2016 4.3 3.5 - 5.2 mmol/L Final   Sodium  Date Value Ref Range Status  08/28/2016 143 134 - 144 mmol/L Final   Creat  Date Value Ref  Range Status  12/13/2015 0.61 0.50 - 1.10 mg/dL Final   Creatinine, Ser  Date Value Ref Range Status  08/28/2016 0.53 (L) 0.57 - 1.00 mg/dL Final            Chief Complaint noted Review of Symptoms - see HPI PMH - Smoking status noted.     Objective Vital Signs reviewed Diabetic Foot Check -  Appearance - no lesions, ulcers or calluses Skin - no unusual pallor or redness Monofilament testing -  Right - Great toe, medial, central, lateral ball and posterior foot intact Left - Great toe, medial, central, lateral ball and posterior foot intact     Assessments/Plans  Diabetes mellitus type 2 without retinopathy Stable. Continue current meds  Essential hypertension, benign Not at goal. May be due to not taking her medication today. Monitor  Vaginal discharge Discharge is present only after or during sex.  May be a condom allergy.  Trial of latex free condoms    See after visit summary for details of patient instuctions

## 2017-02-11 NOTE — Assessment & Plan Note (Signed)
Not at goal. May be due to not taking her medication today. Monitor

## 2017-02-11 NOTE — Patient Instructions (Addendum)
Good to see you today!  Thanks for coming in.  You need to have an Eye Exam - ask them to send me a report  Schedule a mammogrm  Schedule a colonscopy - I will refer you for one   For your BP Follow your blood pressure at home.  If regularly > 140/90 call me  For the Discharge Try a non Latex condom - Colleen Ford is one brand .  If you are not better after trying these then let me know  When you fast do not take the metformin  Come back in 3 months

## 2017-02-11 NOTE — Assessment & Plan Note (Signed)
Discharge is present only after or during sex.  May be a condom allergy.  Trial of latex free condoms

## 2017-02-12 ENCOUNTER — Encounter: Payer: Self-pay | Admitting: Internal Medicine

## 2017-02-25 ENCOUNTER — Ambulatory Visit
Admission: RE | Admit: 2017-02-25 | Discharge: 2017-02-25 | Disposition: A | Discharge status: Home / Self Care | Payer: BLUE CROSS/BLUE SHIELD | Source: Ambulatory Visit | Attending: Family Medicine | Admitting: Family Medicine

## 2017-02-25 DIAGNOSIS — Z1231 Encounter for screening mammogram for malignant neoplasm of breast: Secondary | ICD-10-CM

## 2017-02-27 ENCOUNTER — Other Ambulatory Visit: Payer: Self-pay | Admitting: Family Medicine

## 2017-02-27 DIAGNOSIS — R928 Other abnormal and inconclusive findings on diagnostic imaging of breast: Secondary | ICD-10-CM

## 2017-03-18 ENCOUNTER — Ambulatory Visit: Payer: BLUE CROSS/BLUE SHIELD

## 2017-03-18 ENCOUNTER — Ambulatory Visit
Admission: RE | Admit: 2017-03-18 | Discharge: 2017-03-18 | Disposition: A | Discharge status: Home / Self Care | Payer: BLUE CROSS/BLUE SHIELD | Source: Ambulatory Visit | Attending: Family Medicine | Admitting: Family Medicine

## 2017-03-18 DIAGNOSIS — R928 Other abnormal and inconclusive findings on diagnostic imaging of breast: Secondary | ICD-10-CM

## 2017-03-31 ENCOUNTER — Other Ambulatory Visit: Payer: Self-pay

## 2017-03-31 ENCOUNTER — Ambulatory Visit (AMBULATORY_SURGERY_CENTER): Payer: Self-pay

## 2017-03-31 VITALS — Ht 64.0 in | Wt 185.8 lb

## 2017-03-31 DIAGNOSIS — Z1211 Encounter for screening for malignant neoplasm of colon: Secondary | ICD-10-CM

## 2017-03-31 MED ORDER — NA SULFATE-K SULFATE-MG SULF 17.5-3.13-1.6 GM/177ML PO SOLN
1.0000 | Freq: Once | ORAL | 0 refills | Status: AC
Start: 2017-03-31 — End: 2017-03-31

## 2017-03-31 NOTE — Progress Notes (Signed)
No egg or soy allergy known to patient  No issues with past sedation with any surgeries  or procedures, no intubation problems, pt states after tubal ligation  iv site was itching, iv was removed and itching stopped, pt unaware of meds used No diet pills per patient No home 02 use per patient  No blood thinners per patient  Pt denies issues with constipation  No A fib or A flutter  EMMI video sent to pt's e mail sent during PV.

## 2017-04-03 ENCOUNTER — Encounter: Payer: Self-pay | Admitting: Internal Medicine

## 2017-04-14 ENCOUNTER — Ambulatory Visit (AMBULATORY_SURGERY_CENTER): Payer: BLUE CROSS/BLUE SHIELD | Admitting: Internal Medicine

## 2017-04-14 ENCOUNTER — Other Ambulatory Visit: Payer: Self-pay

## 2017-04-14 ENCOUNTER — Encounter: Payer: Self-pay | Admitting: Internal Medicine

## 2017-04-14 VITALS — BP 121/73 | HR 69 | Temp 97.7°F | Resp 13 | Ht 64.0 in | Wt 185.0 lb

## 2017-04-14 DIAGNOSIS — Z1212 Encounter for screening for malignant neoplasm of rectum: Secondary | ICD-10-CM

## 2017-04-14 DIAGNOSIS — Z1211 Encounter for screening for malignant neoplasm of colon: Secondary | ICD-10-CM

## 2017-04-14 MED ORDER — SODIUM CHLORIDE 0.9 % IV SOLN: 500.0000 mL | Freq: Once | INTRAVENOUS | Status: DC

## 2017-04-14 NOTE — Patient Instructions (Signed)
YOU HAD AN ENDOSCOPIC PROCEDURE TODAY AT [facility]:   Refer to the procedure report that was given to you for any specific questions about what was found during the examination.  If the procedure report does not answer your questions, please call your gastroenterologist to clarify.  If you requested that your care partner not be given the details of your procedure findings, then the procedure report has been included in a sealed envelope for you to review at your convenience later.  YOU SHOULD EXPECT: Some feelings of bloating in the abdomen. Passage of more gas than usual.  Walking can help get rid of the air that was put into your GI tract during the procedure and reduce the bloating. If you had a lower endoscopy (such as a colonoscopy or flexible sigmoidoscopy) you may notice spotting of blood in your stool or on the toilet paper. If you underwent a bowel prep for your procedure, you may not have a normal bowel movement for a few days.  Please Note:  You might notice some irritation and congestion in your nose or some drainage.  This is from the oxygen used during your procedure.  There is no need for concern and it should clear up in a day or so.  SYMPTOMS TO REPORT IMMEDIATELY:   Following lower endoscopy (colonoscopy or flexible sigmoidoscopy):  Excessive amounts of blood in the stool  Significant tenderness or worsening of abdominal pains  Swelling of the abdomen that is new, acute  Fever of 100F or higher  For urgent or emergent issues, a gastroenterologist can be reached at any hour by calling [phone].   DIET:  We do recommend a small meal at first, but then you may proceed to your regular diet.  Drink plenty of fluids but you should avoid alcoholic beverages for 24 hours.  ACTIVITY:  You should plan to take it easy for the rest of today and you should NOT DRIVE or use heavy machinery until tomorrow (because of the sedation medicines used during the test).     FOLLOW UP: Our staff will call the number listed on your records the next business day following your procedure to check on you and address any questions or concerns that you may have regarding the information given to you following your procedure. If we do not reach you, we will leave a message.  However, if you are feeling well and you are not experiencing any problems, there is no need to return our call.  We will assume that you have returned to your regular daily activities without incident.  If any biopsies were taken you will be contacted by phone or by letter within the next 1-3 weeks.  Please call us at [phone] if you have not heard about the biopsies in 3 weeks.    SIGNATURES/CONFIDENTIALITY: You and/or your care partner have signed paperwork which will be entered into your electronic medical record.  These signatures attest to the fact that that the information above on your After Visit Summary has been reviewed and is understood.  Full responsibility of the confidentiality of this discharge information lies with you and/or your care-partner.   Your blood sugar was 117 in the recovery room. You may resume your current medications today. Next screening colonoscopy is 10 years. Please call if any questions or concerns.

## 2017-04-14 NOTE — Op Note (Signed)
[facility] Patient Name: Colleen Ford Procedure Date: 04/14/2017 10:37 AM MRN: [id number] Endoscopist: Jerene Bears , MD Age: 52 Referring MD:  Date of Birth: 1965/09/16 Gender: Female Account #: [id number] Procedure:                Colonoscopy Indications:              Screening for colorectal malignant neoplasm, This                            is the patient's first colonoscopy Medicines:                Monitored Anesthesia Care Procedure:                Pre-Anesthesia Assessment:                           - Prior to the procedure, a History and Physical                            was performed, and patient medications and                            allergies were reviewed. The patient's tolerance of                            previous anesthesia was also reviewed. The risks                            and benefits of the procedure and the sedation                            options and risks were discussed with the patient.                            All questions were answered, and informed consent                            was obtained. Prior Anticoagulants: The patient has                            taken no previous anticoagulant or antiplatelet                            agents. ASA Grade Assessment: II - A patient with                            mild systemic disease. After reviewing the risks                            and benefits, the patient was deemed in                            satisfactory condition to undergo the procedure.  After obtaining informed consent, the colonoscope                            was passed under direct vision. Throughout the                            procedure, the patient's blood pressure, pulse, and                            oxygen saturations were monitored continuously. The                            Model PCF-H190DL [id number]) scope was introduced                            through the anus and  advanced to the the cecum,                            identified by appendiceal orifice and ileocecal                            valve. The colonoscopy was performed without                            difficulty. The patient tolerated the procedure                            well. The quality of the bowel preparation was                            good. The ileocecal valve, appendiceal orifice, and                            rectum were photographed. Scope In: 10:42:45 AM Scope Out: 10:58:17 AM Scope Withdrawal Time: 0 hours 9 minutes 15 seconds  Total Procedure Duration: 0 hours 15 minutes 32 seconds  Findings:                 The perianal and digital rectal examinations were                            normal.                           The entire examined colon appeared normal on direct                            and retroflexion views. Complications:            No immediate complications. Estimated Blood Loss:     Estimated blood loss: none. Impression:               - The entire examined colon is normal on direct and                            retroflexion views.                           -  No specimens collected. Recommendation:           - Patient has a contact number available for                            emergencies. The signs and symptoms of potential                            delayed complications were discussed with the                            patient. Return to normal activities tomorrow.                            Written discharge instructions were provided to the                            patient.                           - Resume previous diet.                           - Continue present medications.                           - Repeat colonoscopy in 10 years for screening                            purposes. Jerene Bears, MD 04/14/2017 11:00:21 AM This report has been signed electronically.

## 2017-04-14 NOTE — Progress Notes (Signed)
Pt's states no medical or surgical changes since previsit or office visit. 

## 2017-04-14 NOTE — Progress Notes (Signed)
Report given to PACU, vss 

## 2017-04-14 NOTE — Progress Notes (Signed)
No problems noted in the recovery room. maw 

## 2017-04-15 ENCOUNTER — Telehealth: Payer: Self-pay | Admitting: *Deleted

## 2017-04-15 ENCOUNTER — Telehealth: Payer: Self-pay | Admitting: Family Medicine

## 2017-04-15 ENCOUNTER — Telehealth: Payer: Self-pay

## 2017-04-15 NOTE — Telephone Encounter (Signed)
Left message

## 2017-04-15 NOTE — Telephone Encounter (Signed)
Pt had a colonoscopy yesterday, March 11th, 2019, at a provider's office on [address].  She did not remember the provider's name.  May be able to follow up with her at next appointment to get the information.  Pt said the she will call and make a care appt.  She has been out of work and is waiting to accrue more time off before asking to come to the practice.  Pt is also without eye insurance and having difficulty finding an ophthalmologist.  I informed the pt to make sure she address these concerns to you to see if she could be referred to a practitioner who arranges payment plans or if there is a program that helps in cost assistance.

## 2017-04-15 NOTE — Telephone Encounter (Signed)
  Follow up Call-  Call back number 04/14/2017  Post procedure Call Back phone  # [phone]  Permission to leave phone message Yes  Some recent data might be hidden     Patient questions:  Message left to call us if  Necessary. Second call.

## 2017-04-22 ENCOUNTER — Ambulatory Visit: Payer: BLUE CROSS/BLUE SHIELD | Admitting: Family Medicine

## 2017-04-28 ENCOUNTER — Encounter: Payer: Self-pay | Admitting: Family Medicine

## 2017-04-28 ENCOUNTER — Other Ambulatory Visit: Payer: Self-pay | Admitting: Family Medicine

## 2017-04-28 DIAGNOSIS — I10 Essential (primary) hypertension: Secondary | ICD-10-CM

## 2017-05-07 ENCOUNTER — Encounter: Payer: Self-pay | Admitting: Family Medicine

## 2017-05-07 ENCOUNTER — Other Ambulatory Visit: Payer: Self-pay

## 2017-05-07 ENCOUNTER — Ambulatory Visit (INDEPENDENT_AMBULATORY_CARE_PROVIDER_SITE_OTHER): Payer: BLUE CROSS/BLUE SHIELD | Admitting: Family Medicine

## 2017-05-07 VITALS — BP 119/75 | HR 77 | Temp 97.8°F | Ht 64.0 in | Wt 187.2 lb

## 2017-05-07 DIAGNOSIS — D649 Anemia, unspecified: Secondary | ICD-10-CM

## 2017-05-07 DIAGNOSIS — N950 Postmenopausal bleeding: Secondary | ICD-10-CM | POA: Diagnosis not present

## 2017-05-07 DIAGNOSIS — E119 Type 2 diabetes mellitus without complications: Secondary | ICD-10-CM

## 2017-05-07 DIAGNOSIS — M25562 Pain in left knee: Secondary | ICD-10-CM

## 2017-05-07 LAB — POCT GLYCOSYLATED HEMOGLOBIN (HGB A1C): Hemoglobin A1C: 6.8

## 2017-05-07 NOTE — Progress Notes (Signed)
Subjective  Patient is presenting with the following illnesses  DIABETES Disease Monitoring: Blood Sugar ranges(Severity) -not checking  Associated Symptoms- Polyuria/phagia/dipsia- no      Visual problems- no Medications: Compliance(Modifying factor) - metformin most days once a day Hypoglycemic symptoms- no Timing - continuous  L KNEE PAIN Has had on and off for years.  Hurts her after sitting and when moves a lot.  Some swelling that comes and goes.  No locking or giving way.  No other joint pains or rashes.  Does not take anything   Xray in past showed DJD  Monitoring Labs and Parameters Last A1C:  Lab Results  Component Value Date   HGBA1C 6.8 05/07/2017   Last Lipid:     Component Value Date/Time   CHOL 206 (H) 12/13/2015 0939   HDL 103 12/13/2015 0939   LDLDIRECT 117 (H) 04/04/2010 1953   Last Bmet  Potassium  Date Value Ref Range Status  08/28/2016 4.3 3.5 - 5.2 mmol/L Final   Sodium  Date Value Ref Range Status  08/28/2016 143 134 - 144 mmol/L Final   Creat  Date Value Ref Range Status  12/13/2015 0.61 0.50 - 1.10 mg/dL Final   Creatinine, Ser  Date Value Ref Range Status  08/28/2016 0.53 (L) 0.57 - 1.00 mg/dL Final      Chief Complaint noted Review of Symptoms - see HPI PMH - Smoking status noted.     Objective Vital Signs reviewed L Knee - FROM.  No palpable effusion.  No laxity.  Mild crepitus.  Diffuse mild pain with palpation    Assessments/Plans  Post-menopausal bleeding Endometrial bx was normal Sept 2018.  No bleeding for last 4 months   Diabetes mellitus type 2 without retinopathy Well controlled   Knee pain, left Consistent with DJD.  Try tylenol and weight loss    See after visit summary for details of patient instuctions

## 2017-05-07 NOTE — Assessment & Plan Note (Signed)
Well controlled 

## 2017-05-07 NOTE — Assessment & Plan Note (Signed)
Consistent with DJD.  Try tylenol and weight loss

## 2017-05-07 NOTE — Progress Notes (Signed)
A1C

## 2017-05-07 NOTE — Assessment & Plan Note (Addendum)
Endometrial bx was normal Sept 2018.  No bleeding for last 4 months

## 2017-05-07 NOTE — Patient Instructions (Addendum)
Good to see you today!  Thanks for coming in.  Your diabetes and BP are doing great  For the Knee   Straight leg no bend exercises  Tylenol for pain  Weight loss to decrease the load  For your weight  Regular exercise  Main approach is portion control - smaller amounts and more  fruits and veggies less sweets and fats   Aim to lose 1 lb a week   I will call you if your tests are not good.  Otherwise I will send you a letter.  If you do not hear from me with in 2 weeks please call our office.     Homework Please see an eye doctor for a diabetes eye check -   Come back in 6 months

## 2017-05-08 ENCOUNTER — Encounter: Payer: Self-pay | Admitting: Family Medicine

## 2017-05-08 LAB — MICROALBUMIN / CREATININE URINE RATIO
Creatinine, Urine: 56.7 mg/dL
Microalb/Creat Ratio: 13.9 mg/g creat (ref 0.0–30.0)
Microalbumin, Urine: 7.9 ug/mL

## 2017-05-08 LAB — CBC
Hematocrit: 34.8 % (ref 34.0–46.6)
Hemoglobin: 10.7 g/dL — ABNORMAL LOW (ref 11.1–15.9)
MCH: 22.9 pg — ABNORMAL LOW (ref 26.6–33.0)
MCHC: 30.7 g/dL — ABNORMAL LOW (ref 31.5–35.7)
MCV: 75 fL — ABNORMAL LOW (ref 79–97)
Platelets: 238 10*3/uL (ref 150–379)
RBC: 4.67 x10E6/uL (ref 3.77–5.28)
RDW: 16.7 % — ABNORMAL HIGH (ref 12.3–15.4)
WBC: 5.9 10*3/uL (ref 3.4–10.8)

## 2017-06-13 ENCOUNTER — Encounter: Payer: Self-pay | Admitting: Family Medicine

## 2017-06-13 DIAGNOSIS — I10 Essential (primary) hypertension: Secondary | ICD-10-CM

## 2017-06-13 MED ORDER — AMLODIPINE BESYLATE 10 MG PO TABS: 10.0000 mg | ORAL_TABLET | Freq: Every day | ORAL | 2 refills | Status: DC

## 2017-06-19 ENCOUNTER — Encounter: Payer: Self-pay | Admitting: Family Medicine

## 2017-07-01 ENCOUNTER — Encounter: Payer: Self-pay | Admitting: Family Medicine

## 2017-07-02 ENCOUNTER — Ambulatory Visit: Payer: BLUE CROSS/BLUE SHIELD | Admitting: Family Medicine

## 2017-07-08 ENCOUNTER — Encounter: Payer: Self-pay | Admitting: Family Medicine

## 2017-07-08 ENCOUNTER — Ambulatory Visit (INDEPENDENT_AMBULATORY_CARE_PROVIDER_SITE_OTHER): Payer: BLUE CROSS/BLUE SHIELD | Admitting: Family Medicine

## 2017-07-08 DIAGNOSIS — N950 Postmenopausal bleeding: Secondary | ICD-10-CM

## 2017-07-08 NOTE — Assessment & Plan Note (Addendum)
Normal endometrial biopsy findings from Sept 2018 are reassuring for benign cause of bleeding. Most likely related to hormonal fluctuations. If bleeding persists for more than 1 additional week or recurs at another time, will refer to gynecology for further evaluation. Bleeding diathesis is a less likely cause of vaginal bleeding, but should be kept on differential due to recent history of unusual bruises. - Checking CBC and ferritin today to ensure that anemia is resolving/not worsening with new bleeding; also will verify that platelets are normal - Verified that patient has discontinued daily aspirin and is taking daily oral iron supplement - Instructed patient to inform Dr. Erin Hearing if bleeding persists for > 1 more week or occurs again

## 2017-07-08 NOTE — Patient Instructions (Addendum)
Good to see you today!  Thanks for coming in.  Please remember to see your eye doctor to check for diabetes changes  Come back in 2 months for a diabetes check   I will send you a note about your blood test  Let me know if the bleeding does not stop within the next week and we will do more workup and probable referral

## 2017-07-08 NOTE — Progress Notes (Signed)
Date of Visit: 07/08/2017   HPI:  Ms. Colleen Ford is a 52 y.o woman here today to discuss the following  Postmenopausal vaginal bleeding - LMP was June 2015 - Had previous episode of spotting in late June 2018 per 08/28/16 office note; full work up including transvaginal ultrasound and endometrial biopsy was done at this time with reassuring results - Started having similar spotting/light vaginal bleeding on Monday 07/01/17 and light bleeding persisted through Sunday; spotting alternated between bright red and darker color - Beginning Sunday (6/2), bleeding became heavier, "like a normal cycle"; patient has been using pads since this time, changing them each time she uses the restroom; denies heavier bleeding as compared to premenopausal cycles - No changes in bowel movements, denies blood in stool/dark tarry stools - No dysuria, urinary urgency - Noticed some unusual bruises on elbows around 06/24/17 which have since resolved; messaged Dr. Erin Hearing about this and he instructed her to discontinue aspirin at this time - Denies bleeding from gums with brushing teeth - Sexually active with long-term partner; uses protection; has not noticed any vaginal itching or irritation - No personal history or known family history of bleeding disorder - Denies changes in appetite, unintentional weight loss, fever, chills   ROS: See HPI.  PMH: Type 2 diabetes, HTN, hyperlipidemia  PHYSICAL EXAM: BP 120/80 (BP Location: Right Arm, Patient Position: Sitting, Cuff Size: Normal)   Pulse 79   Temp 98.3 F (36.8 C) (Oral)   Wt 181 lb 9.6 oz (82.4 kg)   LMP 12/31/2013   SpO2 100%   BMI 31.17 kg/m  Gen: Well appearing, sitting comfortably in chair, in no acute distress HEENT: Sclera clear, no conjunctival pallor Neuro: grossly intact without focal findings Skin: no rashes or bruises GU: normal appearing external genitalia without lesions. Vagina is moist with bloody discharge. Small polypoid lesion  visualized at cervical os. No cervical motion tenderness or tenderness on bimanual exam.   ASSESSMENT/PLAN:  Health maintenance:  - Reminded patient to get eye exam and send results to [facility]  Post-menopausal bleeding Normal endometrial biopsy findings from Sept 2018 are reassuring for benign cause of bleeding. Most likely related to hormonal fluctuations. If bleeding persists for more than 1 additional week or recurs at another time, will refer to gynecology for further evaluation. Bleeding diathesis is a less likely cause of vaginal bleeding, but should be kept on differential due to recent history of unusual bruises. - Checking CBC and ferritin today to ensure that anemia is resolving/not worsening with new bleeding; also will verify that platelets are normal - Verified that patient has discontinued daily aspirin and is taking daily oral iron supplement - Instructed patient to inform Dr. Erin Hearing if bleeding persists for > 1 more week or occurs again   FOLLOW UP: Follow up in 2 months for diabetes check.  Michael Litter, [facility]

## 2017-07-09 ENCOUNTER — Encounter: Payer: Self-pay | Admitting: Family Medicine

## 2017-07-09 LAB — CBC
Hematocrit: 39.1 % (ref 34.0–46.6)
Hemoglobin: 12.4 g/dL (ref 11.1–15.9)
MCH: 23.6 pg — ABNORMAL LOW (ref 26.6–33.0)
MCHC: 31.7 g/dL (ref 31.5–35.7)
MCV: 75 fL — ABNORMAL LOW (ref 79–97)
Platelets: 259 10*3/uL (ref 150–450)
RBC: 5.25 x10E6/uL (ref 3.77–5.28)
RDW: 17 % — ABNORMAL HIGH (ref 12.3–15.4)
WBC: 6.7 10*3/uL (ref 3.4–10.8)

## 2017-07-09 LAB — FERRITIN: Ferritin: 11 ng/mL — ABNORMAL LOW (ref 15–150)

## 2017-07-28 ENCOUNTER — Encounter: Payer: Self-pay | Admitting: Family Medicine

## 2017-10-13 ENCOUNTER — Other Ambulatory Visit: Payer: Self-pay | Admitting: Family Medicine

## 2017-11-22 ENCOUNTER — Encounter: Payer: Self-pay | Admitting: Family Medicine

## 2017-12-22 ENCOUNTER — Encounter: Payer: Self-pay | Admitting: Family Medicine

## 2018-01-12 ENCOUNTER — Encounter: Payer: Self-pay | Admitting: Family Medicine

## 2018-01-26 ENCOUNTER — Encounter: Payer: Self-pay | Admitting: Obstetrics and Gynecology

## 2018-01-26 ENCOUNTER — Ambulatory Visit (INDEPENDENT_AMBULATORY_CARE_PROVIDER_SITE_OTHER): Payer: BLUE CROSS/BLUE SHIELD | Admitting: Family Medicine

## 2018-01-26 ENCOUNTER — Other Ambulatory Visit: Payer: Self-pay

## 2018-01-26 VITALS — BP 138/82 | HR 79 | Temp 98.4°F | Wt 179.8 lb

## 2018-01-26 DIAGNOSIS — N950 Postmenopausal bleeding: Secondary | ICD-10-CM

## 2018-01-26 DIAGNOSIS — I10 Essential (primary) hypertension: Secondary | ICD-10-CM | POA: Diagnosis not present

## 2018-01-26 DIAGNOSIS — Z23 Encounter for immunization: Secondary | ICD-10-CM | POA: Diagnosis not present

## 2018-01-26 DIAGNOSIS — E7800 Pure hypercholesterolemia, unspecified: Secondary | ICD-10-CM

## 2018-01-26 DIAGNOSIS — E119 Type 2 diabetes mellitus without complications: Secondary | ICD-10-CM | POA: Diagnosis not present

## 2018-01-26 LAB — POCT GLYCOSYLATED HEMOGLOBIN (HGB A1C): HbA1c, POC (controlled diabetic range): 7 % (ref 0.0–7.0)

## 2018-01-26 NOTE — Patient Instructions (Addendum)
Good to see you today!  Thanks for coming in.  Take the metformin every other day  Keeping taking amlodipine every day  I will call you if your tests are not good.  Otherwise I will send you a letter.  If you do not hear from me with in 2 weeks please call our office.     I will refer you to Gynecology for the menstrual periods   Exercise - think of what you might want to do Start slow 5-10 minutes at a time Move up to 20-30 a day  Come back in 3 months for a diabetes check

## 2018-01-26 NOTE — Progress Notes (Signed)
Subjective  Colleen Ford is a 52 y.o. female is presenting with the following  DIABETES Disease Monitoring: Blood Sugar ranges(Severity) -not checking  Associated Symptoms- Polyuria/phagia/dipsia- no      Visual problems- no Medications: Compliance(Modifying factor) - taking metformin every other day Hypoglycemic symptoms- no Timing - continuous  HYPERTENSION Disease Monitoring  Home BP Monitoring (Severity) not checking Symptoms - Chest pain- no    Dyspnea- no Medications (Modifying factors) Compliance-  Daily amlodipine. Lightheadedness-  no  Edema- no Timing - continuous  Duration - years   VAGINAL BLEEDING  Had one week earlier this month.  No lightheadness or chest pain or other bleeding  Monitoring Labs and Parameters Last A1C:  Lab Results  Component Value Date   HGBA1C 7.0 01/26/2018   Last Lipid:     Component Value Date/Time   CHOL 206 (H) 12/13/2015 0939   HDL 103 12/13/2015 0939   LDLDIRECT 117 (H) 04/04/2010 1953   Last Bmet  Potassium  Date Value Ref Range Status  08/28/2016 4.3 3.5 - 5.2 mmol/L Final   Sodium  Date Value Ref Range Status  08/28/2016 143 134 - 144 mmol/L Final   Creat  Date Value Ref Range Status  12/13/2015 0.61 0.50 - 1.10 mg/dL Final   Creatinine, Ser  Date Value Ref Range Status  08/28/2016 0.53 (L) 0.57 - 1.00 mg/dL Final        Chief Complaint noted Review of Symptoms - see HPI PMH - Smoking status noted.    Objective Vital Signs reviewed BP 138/82   Pulse 79   Temp 98.4 F (36.9 C) (Oral)   Wt 179 lb 12.8 oz (81.6 kg)   LMP 12/31/2013   SpO2 99%   BMI 30.86 kg/m  Heart - Regular rate and rhythm.  No murmurs, gallops or rubs.    Lungs:  Normal respiratory effort, chest expands symmetrically. Lungs are clear to auscultation, no crackles or wheezes.  Assessments/Plans  See after visit summary for details of patient instuctions  Post-menopausal bleeding Continues.   Was without menstrual periods for 3  years then restarted intermitent bleeding.  Had negative endometrial bx but given this it continues will refer to gyn for further evaluation   Diabetes mellitus type 2 without retinopathy At goal   Essential hypertension, benign At goal

## 2018-01-26 NOTE — Assessment & Plan Note (Signed)
Continues.   Was without menstrual periods for 3 years then restarted intermitent bleeding.  Had negative endometrial bx but given this it continues will refer to gyn for further evaluation

## 2018-01-26 NOTE — Assessment & Plan Note (Signed)
At goal.  

## 2018-01-27 LAB — CMP14+EGFR
ALT: 12 IU/L (ref 0–32)
AST: 15 IU/L (ref 0–40)
Albumin/Globulin Ratio: 1.2 (ref 1.2–2.2)
Albumin: 4.3 g/dL (ref 3.5–5.5)
Alkaline Phosphatase: 99 IU/L (ref 39–117)
BUN/Creatinine Ratio: 15 (ref 9–23)
BUN: 10 mg/dL (ref 6–24)
Bilirubin Total: 0.4 mg/dL (ref 0.0–1.2)
CO2: 23 mmol/L (ref 20–29)
Calcium: 9 mg/dL (ref 8.7–10.2)
Chloride: 103 mmol/L (ref 96–106)
Creatinine, Ser: 0.65 mg/dL (ref 0.57–1.00)
GFR calc Af Amer: 119 mL/min/{1.73_m2} (ref >59)
GFR calc non Af Amer: 103 mL/min/{1.73_m2} (ref >59)
Globulin, Total: 3.6 g/dL (ref 1.5–4.5)
Glucose: 140 mg/dL — ABNORMAL HIGH (ref 65–99)
Potassium: 4 mmol/L (ref 3.5–5.2)
Sodium: 143 mmol/L (ref 134–144)
Total Protein: 7.9 g/dL (ref 6.0–8.5)

## 2018-01-27 LAB — CBC
Hematocrit: 39.7 % (ref 34.0–46.6)
Hemoglobin: 12.5 g/dL (ref 11.1–15.9)
MCH: 23.8 pg — ABNORMAL LOW (ref 26.6–33.0)
MCHC: 31.5 g/dL (ref 31.5–35.7)
MCV: 76 fL — ABNORMAL LOW (ref 79–97)
Platelets: 201 10*3/uL (ref 150–450)
RBC: 5.25 x10E6/uL (ref 3.77–5.28)
RDW: 14.6 % (ref 12.3–15.4)
WBC: 6.3 10*3/uL (ref 3.4–10.8)

## 2018-01-27 LAB — LDL CHOLESTEROL, DIRECT: LDL Direct: 108 mg/dL — ABNORMAL HIGH (ref 0–99)

## 2018-02-09 ENCOUNTER — Ambulatory Visit (INDEPENDENT_AMBULATORY_CARE_PROVIDER_SITE_OTHER): Payer: BLUE CROSS/BLUE SHIELD | Admitting: Obstetrics and Gynecology

## 2018-02-09 ENCOUNTER — Encounter: Payer: Self-pay | Admitting: Obstetrics and Gynecology

## 2018-02-09 ENCOUNTER — Other Ambulatory Visit (HOSPITAL_COMMUNITY)
Admission: RE | Admit: 2018-02-09 | Discharge: 2018-02-09 | Disposition: A | Discharge status: Home / Self Care | Payer: BLUE CROSS/BLUE SHIELD | Source: Ambulatory Visit | Attending: Obstetrics and Gynecology | Admitting: Obstetrics and Gynecology

## 2018-02-09 ENCOUNTER — Other Ambulatory Visit: Payer: Self-pay

## 2018-02-09 VITALS — BP 116/70 | HR 80 | Resp 16 | Ht 63.35 in | Wt 179.0 lb

## 2018-02-09 DIAGNOSIS — N950 Postmenopausal bleeding: Secondary | ICD-10-CM | POA: Insufficient documentation

## 2018-02-09 DIAGNOSIS — N898 Other specified noninflammatory disorders of vagina: Secondary | ICD-10-CM | POA: Diagnosis not present

## 2018-02-09 NOTE — Progress Notes (Signed)
Patient scheduled while in office for [facility] and EMB on 02/19/18 at 10:30am. Advised patient to take Motrin 800 mg with food and water one hour before procedure. Patient is aware she will be called to review benefits prior to appt. Patient verbalizes understanding and is agreeable.

## 2018-02-09 NOTE — Progress Notes (Signed)
GYNECOLOGY  VISIT   HPI: 53 y.o.   Single  African American  female   [phone] with Patient's last menstrual period was 12/31/2013.   here for PMB.  Postmenopausal bleeding is a recurrent problem for the patient. Last time she had bleeding, it started January 09, 2018 and continues until now.  She stopped bleeding for one day only during this time. She also has an odor.  Her prior menstruation was 3 years ago.  She states she has had hot flashes and they stopped.  Prior evaluation for postmenopausal bleeding performed available in Epic. EMB 10/31/16 - benign polypoid endometrium.   Dr. Andrena Mews [facility].  EMB 03/02/15 - benign polyp of the cervix and the endometrium.  Dr. Mora Bellman of [facility]  Pelvic US 09/06/2016: 2 fibroids noted 1.3 and 1.4 cm.  EMS 5 mm.  Ovaries normal.  No free fluid.   Thinks she has had 2 cervical procedures in [place].  A1C 7. 0 on 01/26/18.  GYNECOLOGIC HISTORY: Patient's last menstrual period was 12/31/2013. Contraception:  Tubal ligation Menopausal hormone therapy:  none Last mammogram:  03/18/17 Right Diagnostic MMG - BIRADS 1 negative/density a Last pap smear:   10/31/16 Pap and HR HPV negative per EPIC        OB History    Gravida  13   Para  5   Term  3   Preterm  2   AB  8   Living  3     SAB  7   TAB      Ectopic      Multiple      Live Births  3              Patient Active Problem List   Diagnosis Date Noted  . Post-menopausal bleeding 08/28/2016  . Hyperlipidemia 07/07/2014  . Angioedema of lips 06/04/2013  . Overweight and obesity(278.0) 06/30/2012  . Diabetes mellitus type 2 without retinopathy ([place]) 11/10/2009  . Anemia 11/10/2009  . Essential hypertension, benign 10/06/2009    Past Medical History:  Diagnosis Date  . Anemia   . Arthritis    both knees  . Depression   . Diabetes mellitus without complication ([place])   . GERD (gastroesophageal reflux disease)   .  Hyperlipidemia   . Hypertension     Past Surgical History:  Procedure Laterality Date  . HERNIA REPAIR     hernia surgery as a child  . TUBAL LIGATION      Current Outpatient Medications  Medication Sig Dispense Refill  . amLODipine (NORVASC) 10 MG tablet Take 1 tablet (10 mg total) by mouth daily. 90 tablet 2  . metFORMIN (GLUCOPHAGE) 1000 MG tablet Take 1 tablet (1,000 mg total) by mouth every other day. 180 tablet 0   Current Facility-Administered Medications  Medication Dose Route Frequency Provider Last Rate Last Dose  . 0.9 %  sodium chloride infusion  500 mL Intravenous Once Pyrtle, Lajuan Lines, MD         ALLERGIES: Lisinopril  Family History  Problem Relation Age of Onset  . Diabetes Father   . Hypertension Father   . Lung cancer Father   . Diabetes Mother   . Hypertension Mother   . Hypertension Sister   . Heart attack Maternal Aunt   . Heart Problems Cousin   . Colon cancer Neg Hx   . Colon polyps Neg Hx   . Esophageal cancer Neg Hx   . Rectal cancer  Neg Hx   . Stomach cancer Neg Hx     Social History   Socioeconomic History  . Marital status: Single    Spouse name: Not on file  . Number of children: Not on file  . Years of education: Not on file  . Highest education level: Not on file  Occupational History  . Not on file  Social Needs  . Financial resource strain: Not on file  . Food insecurity:    Worry: Not on file    Inability: Not on file  . Transportation needs:    Medical: Not on file    Non-medical: Not on file  Tobacco Use  . Smoking status: Former Smoker    Last attempt to quit: 03/02/1995    Years since quitting: 22.9  . Smokeless tobacco: Never Used  Substance and Sexual Activity  . Alcohol use: Yes    Comment: rarely  . Drug use: Never  . Sexual activity: Yes    Birth control/protection: Surgical    Comment: tubal ligation  Lifestyle  . Physical activity:    Days per week: Not on file    Minutes per session: Not on file  .  Stress: Not on file  Relationships  . Social connections:    Talks on phone: Not on file    Gets together: Not on file    Attends religious service: Not on file    Active member of club or organization: Not on file    Attends meetings of clubs or organizations: Not on file    Relationship status: Not on file  . Intimate partner violence:    Fear of current or ex partner: Not on file    Emotionally abused: Not on file    Physically abused: Not on file    Forced sexual activity: Not on file  Other Topics Concern  . Not on file  Social History Narrative  . Not on file    Review of Systems  Constitutional: Negative.   HENT: Negative.   Eyes: Negative.   Respiratory: Negative.   Cardiovascular: Negative.   Gastrointestinal: Negative.   Endocrine: Negative.   Genitourinary: Positive for vaginal bleeding.       Vaginal odor  Musculoskeletal: Negative.   Skin: Negative.   Allergic/Immunologic: Negative.   Neurological: Negative.   Hematological: Negative.   Psychiatric/Behavioral: Negative.     PHYSICAL EXAMINATION:    BP 116/70 (BP Location: Right Arm, Patient Position: Sitting, Cuff Size: Normal)   Pulse 80   Resp 16   Ht 5' 3.35" (1.609 m)   Wt 179 lb (81.2 kg)   LMP 12/31/2013   BMI 31.36 kg/m     General appearance: alert, cooperative and appears stated age Head: Normocephalic, without obvious abnormality, atraumatic Neck: no adenopathy, supple, symmetrical, trachea midline and thyroid normal to inspection and palpation Lungs: clear to auscultation bilaterally Heart: regular rate and rhythm Abdomen: soft, non-tender, no masses,  no organomegaly Extremities: extremities normal, atraumatic, no cyanosis or edema Skin: Skin color, texture, turgor normal. No rashes or lesions No abnormal inguinal nodes palpated Neurologic: Grossly normal  Pelvic: External genitalia:  no lesions              Urethra:  normal appearing urethra with no masses, tenderness or lesions               Bartholins and Skenes: normal                 Vagina: normal appearing  vagina with normal color and discharge, no lesions              Cervix: no lesions.  Consistent with prior LEEP.  Brown discharge.  Bleeds with pap.             Bimanual Exam:  Uterus:  normal size, contour, position, consistency, mobility, non-tender              Adnexa: no mass, fullness, tenderness              Rectal exam: Yes.  .  Confirms.              Anus:  normal sphincter tone, no lesions  Chaperone was present for exam.  ASSESSMENT   Postmenopausal bleeding.  Fibroids.  Polyps of endometrium and cx.  Hx LEEP.  Vaginal odor.   PLAN  We discussed postmenopausal bleeding and potential etiologies.  Will check FSH, Estradiol to confirm menopausal status.  Pap and vaginitis testing.  Sonohysterogram and EMB.  Procedures explained.  I discussed potential hysteroscopy with dilation and curettage if endometrial mass/polyp noted.   An After Visit Summary was printed and given to the patient.  ____30__ minutes face to face time of which over 50% was spent in counseling.

## 2018-02-10 ENCOUNTER — Telehealth: Payer: Self-pay | Admitting: Obstetrics and Gynecology

## 2018-02-10 LAB — ESTRADIOL: Estradiol: 7.1 pg/mL

## 2018-02-10 LAB — FOLLICLE STIMULATING HORMONE: FSH: 55.1 m[IU]/mL

## 2018-02-10 NOTE — Telephone Encounter (Signed)
Call placed to patient to review benefits for scheduled appointment on 02/19/2018. Left voicemail message requesting a return call

## 2018-02-12 LAB — CYTOLOGY - PAP
Bacterial vaginitis: POSITIVE — AB
Candida vaginitis: POSITIVE — AB
Diagnosis: NEGATIVE
HPV: NOT DETECTED
Trichomonas: NEGATIVE

## 2018-02-12 NOTE — Telephone Encounter (Signed)
Spoke with patient regarding benefits for scheduled appointment. Patient understood information presented, but states she just started a new job and is considering [employer], [employer]. Patient states she wants to cancel appointment scheduled for 02/19/2018 and call back at a later time to reschedule.   Forwarding to Dr Quincy Simmonds  cc: Lamont Snowball, RN  cc: Thayer Ohm

## 2018-02-13 ENCOUNTER — Telehealth: Payer: Self-pay | Admitting: Emergency Medicine

## 2018-02-13 MED ORDER — METRONIDAZOLE 0.75 % VA GEL: VAGINAL | 0 refills | Status: DC

## 2018-02-13 MED ORDER — FLUCONAZOLE 150 MG PO TABS: ORAL_TABLET | ORAL | 0 refills | Status: DC

## 2018-02-13 NOTE — Telephone Encounter (Signed)
I strongly recommend evaluation for postmenopausal bleeding.  The bleeding may represent either a benign or malignant diagnoses, which need care.  Cc- Colleen Ford

## 2018-02-13 NOTE — Telephone Encounter (Signed)
Spoke with patient.  Message from Dr. Quincy Simmonds given.  Instructions regarding use of Diflucan and Metrogel (Pt choice) given.   Discussed with patient importance of follow up with Dr. Quincy Simmonds for ultrasound and endometrial biopsy evaluation for post menopausal bleeding. Further explanation given to patient regarding post menopausal bleeding,  bleeding after menopause without current hormones is abnormal.  Will need office visit for MD evaluation to evaluate for any presense of abnormal cells or precancerous/cancerous changes of the uterine lining, infection, polyps or fibroids.   Pt verbalized understanding of reason for follow up and I stressed the importance of follow up and concern that Dr. Quincy Simmonds has regarding follow up. Patient states she understands and will call back for follow up appointment.    [place]  Routing to provider and will close encounter.

## 2018-02-13 NOTE — Telephone Encounter (Signed)
-----   Message from Nunzio Cobbs, MD sent at 02/13/2018  6:24 AM EST ----- Please report results to patient.  Pap normal and HR HPV negative.  Pap recall - 02.  He pap showed BV and yeast.   She may treat with Flagyl 500 mg po bid for 7 days or Metrogel pv at hs for 5 nights.  Please send Rx to pharmacy of choice. ETOH precautions.  Her yeast may be treated with Diflucan 150 mg po x 1.  May repeat in 72 hours prn.   Please note that she presented with postmenopausal bleeding, and has declined to complete evaluation for this at this time.  See separate phone note.  Cc- Lamont Snowball

## 2018-02-19 ENCOUNTER — Other Ambulatory Visit: Payer: Self-pay | Admitting: Obstetrics and Gynecology

## 2018-02-19 ENCOUNTER — Other Ambulatory Visit: Payer: Self-pay

## 2018-02-20 ENCOUNTER — Encounter: Payer: Self-pay | Admitting: Family Medicine

## 2018-02-20 MED ORDER — ATORVASTATIN CALCIUM 40 MG PO TABS: 40.0000 mg | ORAL_TABLET | Freq: Every day | ORAL | 3 refills | Status: DC

## 2018-02-20 NOTE — Assessment & Plan Note (Signed)
Sent message to start statin Lipitor 40 mg daily

## 2018-02-20 NOTE — Addendum Note (Signed)
Addended by: Talbert Cage L on: 02/20/2018 02:54 PM   Modules accepted: Orders

## 2018-03-12 NOTE — Telephone Encounter (Signed)
Call to patient. Phone immediately answered by automated message stating that "caller cannot receive calls at this time." unable to leave message.

## 2018-03-13 NOTE — Telephone Encounter (Signed)
Call to patient. Discussed importance for recommended sonohysterogram and endometrial biopsy. Patient declines to schedule due to insurance issue. Still waiting for new insurance plan which she hopes will be effective in a few weeks.  Advised of need to rule out precancerous or cancerous cells. Declines ultrasound at hospital or office payment options.  Patient reports she has just regained some financial stability and is not able to take on anything to jeopardize this. Advised I will look into other options for her and call back.

## 2018-03-19 NOTE — Telephone Encounter (Signed)
Routing to Dr.Silva for review. 

## 2018-03-21 NOTE — Telephone Encounter (Signed)
I recommend a 30 day letter.

## 2018-03-23 ENCOUNTER — Encounter: Payer: Self-pay | Admitting: *Deleted

## 2018-03-23 NOTE — Telephone Encounter (Signed)
Letter to your desk for review.

## 2018-03-23 NOTE — Telephone Encounter (Signed)
Letter signed by me and returned to you.  

## 2018-03-24 NOTE — Telephone Encounter (Signed)
Letter mailed. Encounter closed.  

## 2018-09-18 ENCOUNTER — Other Ambulatory Visit: Payer: Self-pay | Admitting: Family Medicine

## 2018-09-18 DIAGNOSIS — I10 Essential (primary) hypertension: Secondary | ICD-10-CM

## 2018-10-05 ENCOUNTER — Other Ambulatory Visit: Payer: Self-pay

## 2018-10-05 ENCOUNTER — Encounter: Payer: Self-pay | Admitting: Family Medicine

## 2018-10-05 ENCOUNTER — Ambulatory Visit (INDEPENDENT_AMBULATORY_CARE_PROVIDER_SITE_OTHER): Payer: Self-pay | Admitting: Family Medicine

## 2018-10-05 VITALS — BP 134/80 | HR 73 | Wt 192.0 lb

## 2018-10-05 DIAGNOSIS — N950 Postmenopausal bleeding: Secondary | ICD-10-CM

## 2018-10-05 DIAGNOSIS — M25561 Pain in right knee: Secondary | ICD-10-CM

## 2018-10-05 DIAGNOSIS — Z23 Encounter for immunization: Secondary | ICD-10-CM

## 2018-10-05 DIAGNOSIS — M25562 Pain in left knee: Secondary | ICD-10-CM

## 2018-10-05 DIAGNOSIS — E119 Type 2 diabetes mellitus without complications: Secondary | ICD-10-CM

## 2018-10-05 DIAGNOSIS — G8929 Other chronic pain: Secondary | ICD-10-CM

## 2018-10-05 LAB — POCT GLYCOSYLATED HEMOGLOBIN (HGB A1C): HbA1c, POC (controlled diabetic range): 11 % — AB (ref 0.0–7.0)

## 2018-10-05 NOTE — Assessment & Plan Note (Signed)
Consistent with worsening of chronic DJD likely due to recent weight gain.  May need follow up xrays if does not improve with weight loss and analgesics

## 2018-10-05 NOTE — Assessment & Plan Note (Signed)
Poorly controlled likely due to recent weight gain. Discussed weight control and resume full dose metformin

## 2018-10-05 NOTE — Progress Notes (Signed)
Subjective  Colleen Ford is a 53 y.o. female is presenting with the following  KNEE PAIN Bilateral aching worse when standing for long time at work.  No definite swelling or redness or other joint pain.  Xrays in 2014 showed bilateral arthritis.  Not taking any medications   DIABETES Disease Monitoring: Blood Sugar ranges(Severity) -not checking  Associated Symptoms- Polyuria/phagia/dipsia- has gain weight during covid      Visual problems- no Medications: Compliance(Modifying factor) - taking metformin QOD Hypoglycemic symptoms- no Timing - continuous  Monitoring Labs and Parameters Last A1C:  Lab Results  Component Value Date   HGBA1C 11.0 (A) 10/05/2018   Last Lipid:     Component Value Date/Time   CHOL 206 (H) 12/13/2015 0939   HDL 103 12/13/2015 0939   LDLDIRECT 108 (H) 01/26/2018 1107   LDLDIRECT 117 (H) 04/04/2010 1953   Last Bmet  Potassium  Date Value Ref Range Status  01/26/2018 4.0 3.5 - 5.2 mmol/L Final   Sodium  Date Value Ref Range Status  01/26/2018 143 134 - 144 mmol/L Final   Creat  Date Value Ref Range Status  12/13/2015 0.61 0.50 - 1.10 mg/dL Final   Creatinine, Ser  Date Value Ref Range Status  01/26/2018 0.65 0.57 - 1.00 mg/dL Final      Chief Complaint noted Review of Symptoms - see HPI PMH - Smoking status noted.    Objective Vital Signs reviewed BP 134/80   Pulse 73   Wt 192 lb (87.1 kg)   LMP 12/31/2013   SpO2 99%   BMI 33.64 kg/m   Assessments/Plans  See after visit summary for details of patient instuctions  Post-menopausal bleeding Unable to follow up with gyn due to cost.  Was scheduled to have endometrial bx.  She is saving money for this   Diabetes mellitus type 2 without retinopathy Poorly controlled likely due to recent weight gain. Discussed weight control and resume full dose metformin   Bilateral chronic knee pain Consistent with worsening of chronic DJD likely due to recent weight gain.  May need follow up  xrays if does not improve with weight loss and analgesics

## 2018-10-05 NOTE — Patient Instructions (Addendum)
Good to see you today!  Thanks for coming in.  For the Knees - Weight control is the key to preventing worsening of your arthritis - Can use tylenol when really bothering you - Consider smaller portions and less high calorie foods  Your BP is doing well  Your Diabetes is not well controlled.  Restart metformin twice a day and work on weight control  Come back in 3 months

## 2018-10-05 NOTE — Assessment & Plan Note (Addendum)
Unable to follow up with gyn due to cost.  Was scheduled to have endometrial bx.  She is saving money for this

## 2019-01-14 ENCOUNTER — Encounter: Payer: Self-pay | Admitting: Family Medicine

## 2019-01-25 ENCOUNTER — Other Ambulatory Visit: Payer: Self-pay | Admitting: Family Medicine

## 2019-03-09 ENCOUNTER — Encounter: Payer: Self-pay | Admitting: Family Medicine

## 2019-03-11 ENCOUNTER — Encounter: Payer: Self-pay | Admitting: Family Medicine

## 2019-05-05 ENCOUNTER — Ambulatory Visit (INDEPENDENT_AMBULATORY_CARE_PROVIDER_SITE_OTHER): Payer: 59 | Admitting: Family Medicine

## 2019-05-05 ENCOUNTER — Other Ambulatory Visit: Payer: Self-pay

## 2019-05-05 ENCOUNTER — Encounter: Payer: Self-pay | Admitting: Family Medicine

## 2019-05-05 VITALS — BP 126/82 | HR 84 | Wt 193.6 lb

## 2019-05-05 DIAGNOSIS — E7800 Pure hypercholesterolemia, unspecified: Secondary | ICD-10-CM

## 2019-05-05 DIAGNOSIS — D649 Anemia, unspecified: Secondary | ICD-10-CM

## 2019-05-05 DIAGNOSIS — E119 Type 2 diabetes mellitus without complications: Secondary | ICD-10-CM | POA: Diagnosis not present

## 2019-05-05 DIAGNOSIS — I10 Essential (primary) hypertension: Secondary | ICD-10-CM

## 2019-05-05 LAB — POCT GLYCOSYLATED HEMOGLOBIN (HGB A1C): HbA1c, POC (controlled diabetic range): 10.9 % — AB (ref 0.0–7.0)

## 2019-05-05 MED ORDER — METFORMIN HCL ER 750 MG PO TB24: 750.0000 mg | ORAL_TABLET | Freq: Every day | ORAL | 1 refills | Status: DC

## 2019-05-05 MED ORDER — CANAGLIFLOZIN 100 MG PO TABS: 100.0000 mg | ORAL_TABLET | Freq: Every day | ORAL | 3 refills | Status: DC

## 2019-05-05 NOTE — Patient Instructions (Addendum)
Good to see you today!  Thanks for coming in.  See you eye doctor ask them to fax me a report  You are due for a mammogram  For your weight   Diet drinks and non-sweet tea  Smaller less frequent amounts of ice cream  For the Diabetes  - Take the metformin XR 1 tab every day   - Take the Invokana 100 mg every day   - Aim to lose 2lb a week - get a scale and weight yourself once a week   - Think of an exercise to do at least every other day - 20  Minutes  Come back in 6 weeks

## 2019-05-05 NOTE — Assessment & Plan Note (Signed)
Check labs continue lipitor

## 2019-05-05 NOTE — Assessment & Plan Note (Signed)
At goal.  Check labs continue amlodipine

## 2019-05-05 NOTE — Progress Notes (Signed)
    SUBJECTIVE:   CHIEF COMPLAINT / HPI:   HYPERTENSION  Disease Monitoring  Blood pressure range-not checking Chest pain- no      Dyspnea- mild improving after covid Medications  Compliance- daily amlodipine Lightheadedness- no   Edema- no      DIABETES  Disease Monitoring  Blood Sugar ranges-not checking Polyuria- no New Visual problems- no  Medications  Compliance- taking metformin only once daily Hypoglycemic symptoms- no      HYPERLIPIDEMIA  Disease Monitoring  See symptoms for Hypertension  Medications  Compliance- daily lipitor RUQ pain-  Muscle aches- no    ROS See HPI above   PMH Smoking Status noted    PERTINENT  PMH / PSH: Covid infection   OBJECTIVE:   BP 126/82   Pulse 84   Wt 193 lb 9.6 oz (87.8 kg)   LMP 12/31/2013   SpO2 98%   BMI 33.92 kg/m   Heart - Regular rate and rhythm.  No murmurs, gallops or rubs.    Lungs:  Normal respiratory effort, chest expands symmetrically. Lungs are clear to auscultation, no crackles or wheezes. Extremities:  No cyanosis, edema, or deformity noted with good range of motion of all major joints.     ASSESSMENT/PLAN:   Anemia Will check labs   Diabetes mellitus type 2 without retinopathy Not at goal.  Start metformin XR so she can take once a day and Canaglaflozin 100 daily.  See after visit summary   Essential hypertension, benign At goal.  Check labs continue amlodipine  Hyperlipidemia Check labs continue lipitor     [name], MD [place]

## 2019-05-05 NOTE — Assessment & Plan Note (Signed)
Not at goal.  Start metformin XR so she can take once a day and Canaglaflozin 100 daily.  See after visit summary

## 2019-05-05 NOTE — Assessment & Plan Note (Signed)
Will check labs

## 2019-05-06 ENCOUNTER — Encounter: Payer: Self-pay | Admitting: Family Medicine

## 2019-05-06 LAB — CMP14+EGFR
ALT: 15 IU/L (ref 0–32)
AST: 17 IU/L (ref 0–40)
Albumin/Globulin Ratio: 1.4 (ref 1.2–2.2)
Albumin: 4.5 g/dL (ref 3.8–4.9)
Alkaline Phosphatase: 76 IU/L (ref 39–117)
BUN/Creatinine Ratio: 16 (ref 9–23)
BUN: 11 mg/dL (ref 6–24)
Bilirubin Total: 0.5 mg/dL (ref 0.0–1.2)
CO2: 23 mmol/L (ref 20–29)
Calcium: 9.2 mg/dL (ref 8.7–10.2)
Chloride: 100 mmol/L (ref 96–106)
Creatinine, Ser: 0.68 mg/dL (ref 0.57–1.00)
GFR calc Af Amer: 115 mL/min/{1.73_m2} (ref >59)
GFR calc non Af Amer: 100 mL/min/{1.73_m2} (ref >59)
Globulin, Total: 3.3 g/dL (ref 1.5–4.5)
Glucose: 251 mg/dL — ABNORMAL HIGH (ref 65–99)
Potassium: 4.2 mmol/L (ref 3.5–5.2)
Sodium: 140 mmol/L (ref 134–144)
Total Protein: 7.8 g/dL (ref 6.0–8.5)

## 2019-05-06 LAB — CBC
Hematocrit: 37.1 % (ref 34.0–46.6)
Hemoglobin: 11.7 g/dL (ref 11.1–15.9)
MCH: 24.4 pg — ABNORMAL LOW (ref 26.6–33.0)
MCHC: 31.5 g/dL (ref 31.5–35.7)
MCV: 77 fL — ABNORMAL LOW (ref 79–97)
Platelets: 204 10*3/uL (ref 150–450)
RBC: 4.8 x10E6/uL (ref 3.77–5.28)
RDW: 14.8 % (ref 11.7–15.4)
WBC: 7.3 10*3/uL (ref 3.4–10.8)

## 2019-05-06 LAB — LIPID PANEL
Chol/HDL Ratio: 1.6 ratio (ref 0.0–4.4)
Cholesterol, Total: 147 mg/dL (ref 100–199)
HDL: 91 mg/dL (ref >39)
LDL Chol Calc (NIH): 45 mg/dL (ref 0–99)
Triglycerides: 53 mg/dL (ref 0–149)
VLDL Cholesterol Cal: 11 mg/dL (ref 5–40)

## 2019-05-20 ENCOUNTER — Other Ambulatory Visit: Payer: Self-pay | Admitting: Family Medicine

## 2019-05-20 DIAGNOSIS — I10 Essential (primary) hypertension: Secondary | ICD-10-CM

## 2019-05-26 ENCOUNTER — Other Ambulatory Visit: Payer: Self-pay | Admitting: Family Medicine

## 2019-05-26 ENCOUNTER — Encounter: Payer: Self-pay | Admitting: Family Medicine

## 2019-05-26 DIAGNOSIS — E7800 Pure hypercholesterolemia, unspecified: Secondary | ICD-10-CM

## 2019-06-01 ENCOUNTER — Encounter: Payer: Self-pay | Admitting: Podiatry

## 2019-06-01 ENCOUNTER — Ambulatory Visit (INDEPENDENT_AMBULATORY_CARE_PROVIDER_SITE_OTHER): Payer: 59 | Admitting: Podiatry

## 2019-06-01 ENCOUNTER — Ambulatory Visit (INDEPENDENT_AMBULATORY_CARE_PROVIDER_SITE_OTHER): Payer: 59

## 2019-06-01 ENCOUNTER — Other Ambulatory Visit: Payer: Self-pay

## 2019-06-01 VITALS — BP 113/75 | HR 79 | Resp 16

## 2019-06-01 DIAGNOSIS — M2012 Hallux valgus (acquired), left foot: Secondary | ICD-10-CM

## 2019-06-01 DIAGNOSIS — B351 Tinea unguium: Secondary | ICD-10-CM

## 2019-06-01 DIAGNOSIS — M2011 Hallux valgus (acquired), right foot: Secondary | ICD-10-CM | POA: Diagnosis not present

## 2019-06-01 DIAGNOSIS — M79676 Pain in unspecified toe(s): Secondary | ICD-10-CM | POA: Diagnosis not present

## 2019-06-01 NOTE — Progress Notes (Signed)
Subjective:  Patient ID: Colleen Ford, female    DOB: [birth year],  MRN: [id number] HPI Chief Complaint  Patient presents with  . Nail Problem    Toenails - trim toenails  . Foot Pain    1st MPJ bilateral - bunion deformities, shoes uncomfortable at times  . Diabetes    Last a1c was 10.9  . New Patient (Initial Visit)    54 y.o. female presents with the above complaint.   ROS: Denies fever chills nausea vomiting muscle aches pains calf pain back pain chest pain shortness of breath.  Past Medical History:  Diagnosis Date  . Anemia   . Arthritis    both knees  . Depression   . Diabetes mellitus without complication (HCC)   . GERD (gastroesophageal reflux disease)   . Hyperlipidemia   . Hypertension    Past Surgical History:  Procedure Laterality Date  . HERNIA REPAIR     hernia surgery as a child  . TUBAL LIGATION      Current Outpatient Medications:  .  amLODipine (NORVASC) 10 MG tablet, Take 1 tablet by mouth once daily, Disp: 90 tablet, Rfl: 3 .  aspirin EC 81 MG tablet, Take 81 mg by mouth daily., Disp: , Rfl:  .  atorvastatin (LIPITOR) 40 MG tablet, Take 1 tablet by mouth once daily, Disp: 90 tablet, Rfl: 3 .  canagliflozin (INVOKANA) 100 MG TABS tablet, Take 1 tablet (100 mg total) by mouth daily., Disp: 90 tablet, Rfl: 3 .  metFORMIN (GLUCOPHAGE-XR) 750 MG 24 hr tablet, Take 1 tablet (750 mg total) by mouth daily with breakfast., Disp: 90 tablet, Rfl: 1  Allergies  Allergen Reactions  . Lisinopril Swelling   Review of Systems Objective:   Vitals:   06/01/19 1129  BP: 113/75  Pulse: 79  Resp: 16    General: Well developed, nourished, in no acute distress, alert and oriented x3   Dermatological: Skin is warm, dry and supple bilateral. Nails x 10 are well maintained; remaining integument appears unremarkable at this time. There are no open sores, no preulcerative lesions, no rash or signs of infection present.  Vascular: Dorsalis Pedis artery and  Posterior Tibial artery pedal pulses are 2/4 bilateral with immedate capillary fill time. Pedal hair growth present. No varicosities and no lower extremity edema present bilateral.   Neruologic: Grossly intact via light touch bilateral. Vibratory intact via tuning fork bilateral. Protective threshold with Semmes Wienstein monofilament intact to all pedal sites bilateral. Patellar and Achilles deep tendon reflexes 2+ bilateral. No Babinski or clonus noted bilateral.   Musculoskeletal: No gross boney pedal deformities bilateral. No pain, crepitus, or limitation noted with foot and ankle range of motion bilateral. Muscular strength 5/5 in all groups tested bilateral. Hallux valgus deformities bilateral asymptomatic great range of motion mild dorsal spurring is noted.  Gait: Unassisted, Nonantalgic.    Radiographs:  Radiographs taken today demonstrate mild hallux valgus deformities bilateral with hypertrophic medial condyles and a slight increase in the hallux abductus angle. Mild dorsal spurring is noted. No significant acute abnormalities.  Assessment & Plan:   Assessment: Pain in limb secondary to onychomycosis. Diabetes mellitus with no diabetic foot problems.  Plan: Debridement of toenails 1 through 5 bilateral.     Mieka Leaton T. [place], [place]

## 2019-06-22 ENCOUNTER — Other Ambulatory Visit: Payer: Self-pay

## 2019-06-22 ENCOUNTER — Ambulatory Visit (INDEPENDENT_AMBULATORY_CARE_PROVIDER_SITE_OTHER): Payer: 59

## 2019-06-22 ENCOUNTER — Ambulatory Visit (INDEPENDENT_AMBULATORY_CARE_PROVIDER_SITE_OTHER): Payer: 59 | Admitting: Podiatry

## 2019-06-22 ENCOUNTER — Ambulatory Visit: Payer: 59 | Admitting: Family Medicine

## 2019-06-22 ENCOUNTER — Encounter: Payer: Self-pay | Admitting: Podiatry

## 2019-06-22 ENCOUNTER — Telehealth: Payer: Self-pay | Admitting: *Deleted

## 2019-06-22 VITALS — Temp 98.2°F

## 2019-06-22 DIAGNOSIS — R262 Difficulty in walking, not elsewhere classified: Secondary | ICD-10-CM | POA: Diagnosis not present

## 2019-06-22 DIAGNOSIS — S93402A Sprain of unspecified ligament of left ankle, initial encounter: Secondary | ICD-10-CM

## 2019-06-22 DIAGNOSIS — M79605 Pain in left leg: Secondary | ICD-10-CM | POA: Diagnosis not present

## 2019-06-22 NOTE — Progress Notes (Signed)
Subjective: Colleen Ford is a 54 y.o. female patient who presents to office for evaluation of left ankle pain. Patient complains of pain in left ankle s/p injury. She relates she stepped off of a rock and rolled on the outside of her left ankle on last evening. She felt a "pop".  She complains of inability to bear weight on ankle, swelling and pain.   Date of injury: 06/21/2019. Attempted treatments:None.  Patient Active Problem List   Diagnosis Date Noted  . Post-menopausal bleeding 08/28/2016  . Hyperlipidemia 07/07/2014  . Angioedema of lips 06/04/2013  . Overweight and obesity(278.0) 06/30/2012  . Diabetes mellitus type 2 without retinopathy ([place]) 11/10/2009  . Anemia 11/10/2009  . Bilateral chronic knee pain 11/10/2009  . Essential hypertension, benign 10/06/2009    Current Outpatient Medications on File Prior to Visit  Medication Sig Dispense Refill  . amLODipine (NORVASC) 10 MG tablet Take 1 tablet by mouth once daily 90 tablet 3  . aspirin EC 81 MG tablet Take 81 mg by mouth daily.    Marland Kitchen atorvastatin (LIPITOR) 40 MG tablet Take 1 tablet by mouth once daily 90 tablet 3  . canagliflozin (INVOKANA) 100 MG TABS tablet Take 1 tablet (100 mg total) by mouth daily. 90 tablet 3  . metFORMIN (GLUCOPHAGE-XR) 750 MG 24 hr tablet Take 1 tablet (750 mg total) by mouth daily with breakfast. 90 tablet 1  . [DISCONTINUED] atorvastatin (LIPITOR) 40 MG tablet Take 1 tablet (40 mg total) by mouth daily. 90 tablet 3  . [DISCONTINUED] metFORMIN (GLUCOPHAGE) 1000 MG tablet Take 1,000 mg by mouth 2 (two) times daily. 180 tablet 0   No current facility-administered medications on file prior to visit.    Allergies  Allergen Reactions  . Lisinopril Swelling    Objective:  General: Alert and oriented x3 in no acute distress  Vascular Examination: Neurovascular status unchanged b/l. Capillary refill time to digits immediate b/l. Palpable DP pulses b/l. Palpable PT pulses b/l. Pedal hair present  b/l. Skin temperature gradient within normal limits b/l. Unilateral edema left LE posterior to lateral malleolus.   Dermatological Examination: Pedal skin with normal turgor, texture and tone bilaterally. No open wounds bilaterally. No interdigital macerations bilaterally.  Neurological Examination: Protective sensation intact 5/5 intact bilaterally with 10g monofilament b/l.  Musculoskeletal Examination: Normal muscle strength 5/5 to all lower extremity muscle groups bilaterally. No gross bony deformities bilaterally. No pain crepitus or joint limitation noted with ROM b/l. Guarding noted LLE on inversion. Exquisite tenderness along calcaneocuboid joint. +pain at medial and lateral ankle joint. Negative talar tilt, Negative tib-fib stress, No instability. No pain with calf compression bilateral.   Gait: Unable to bear weight today. In wheelchair.  Xray left ankle: no gas in tissues, soft tissue swelling present left ankle and no evidence of fracture left ankle  Assessment and Plan: Problem List Items Addressed This Visit    None    Visit Diagnoses    Sprain of left ankle, unspecified ligament, initial encounter    -  Primary   Relevant Orders   DG Ankle Complete Left (Completed)     -Complete examination performed. -Xrays performed and reviewed with patient in office. -Discussed treatement options. -To decrease swelling/pain, immobilization achieved with soft cast Louretta Parma Boot). -DME Rx: Crutches from [facility] -Rx: None -Consultations: [facility] for Crutch Training for non-weightbearing left LE until MRI study complete. -Additional imaging ordered: Left MRI to rule out tendon/ligament rupture left ankle. -Patient to return to office in 5  days for [employer] change left LE.  Marzetta Board, DPM  Return in about 5 days (around 06/27/2019) for [place] boot change left.

## 2019-06-22 NOTE — Patient Instructions (Addendum)
Status: Non-weightbearing left lower extremity until after MRI is completed.  Follow up after MRI is completed.  Protection - utilize boot when ambulating Rest - Get plenty of sleep and take it easy during the day Ice - Ice the injured area often, no more than 20 minutes at a time Compression - Keep the injured area wrapped as instructed Elevate - Elevating the Injury will reduce swelling    Ankle Sprain  An ankle sprain is a stretch or tear in a ligament in the ankle. Ligaments are tissues that connect bones to each other. The two most common types of ankle sprains are:  Inversion sprain. This happens when the foot turns inward and the ankle rolls outward. It affects the ligament on the outside of the foot (lateral ligament).  Eversion sprain. This happens when the foot turns outward and the ankle rolls inward. It affects the ligament on the inner side of the foot (medial ligament). What are the causes? This condition is often caused by accidentally rolling or twisting the ankle. What increases the risk? You are more likely to develop this condition if you play sports. What are the signs or symptoms? Symptoms of this condition include:  Pain in your ankle.  Swelling.  Bruising. This may develop right after you sprain your ankle or 1-2 days later.  Trouble standing or walking, especially when you turn or change directions. How is this diagnosed? This condition is diagnosed with:  A physical exam. During the exam, your health care provider will press on certain parts of your foot and ankle and try to move them in certain ways.  X-ray imaging. These may be taken to see how severe the sprain is and to check for broken bones. How is this treated? This condition may be treated with:  A brace or splint. This is used to keep the ankle from moving until it heals.  An elastic bandage. This is used to support the ankle.  Crutches.  Pain medicine.  Surgery. This may be needed  if the sprain is severe.  Physical therapy. This may help to improve the range of motion in the ankle. Follow these instructions at home: If you have a brace or a splint:  Wear the brace or splint as told by your health care provider. Remove it only as told by your health care provider.  Loosen the brace or splint if your toes tingle, become numb, or turn cold and blue.  Keep the brace or splint clean.  If the brace or splint is not waterproof: ? Do not let it get wet. ? Cover it with a watertight covering when you take a bath or a shower. If you have an elastic bandage (dressing):  Remove it to shower or bathe.  Try not to move your ankle much, but wiggle your toes from time to time. This helps to prevent swelling.  Adjust the dressing to make it more comfortable if it feels too tight.  Loosen the dressing if you have numbness or tingling in your foot, or if your foot becomes cold and blue. Managing pain, stiffness, and swelling   Take over-the-counter and prescription medicines only as told by your health care provider.  For 2-3 days, keep your ankle raised (elevated) above the level of your heart as much as possible.  If directed, put ice on the injured area: ? If you have a removable brace or splint, remove it as told by your health care provider. ? Put ice in a plastic  bag. ? Place a towel between your skin and the bag. ? Leave the ice on for 20 minutes, 2-3 times a day. General instructions  Rest your ankle.  Do not use the injured limb to support your body weight until your health care provider says that you can. Use crutches as told by your health care provider.  Do not use any products that contain nicotine or tobacco, such as cigarettes, e-cigarettes, and chewing tobacco. If you need help quitting, ask your health care provider.  Keep all follow-up visits as told by your health care provider. This is important. Contact a health care provider if:  You have  rapidly increasing bruising or swelling.  Your pain is not relieved with medicine. Get help right away if:  Your foot or toes become numb or blue.  You have severe pain that gets worse. Summary  An ankle sprain is a stretch or tear in a ligament in the ankle. Ligaments are tissues that connect bones to each other.  This condition is often caused by accidentally rolling or twisting the ankle.  Symptoms include pain, swelling, bruising, and trouble walking.  To relieve pain and swelling, put ice on the affected ankle, raise your ankle above the level of your heart, and use an elastic bandage.  Keep all follow-up visits as told by your health care provider. This is important. This information is not intended to replace advice given to you by your health care provider. Make sure you discuss any questions you have with your health care provider. Document Revised: 10/13/2017 Document Reviewed: 06/17/2017 Elsevier Patient Education  [employer].

## 2019-06-22 NOTE — Telephone Encounter (Signed)
Dr. Elisha Ponder request [facility] PT crutches training while pt is still in office for sprain.

## 2019-06-24 ENCOUNTER — Telehealth: Payer: Self-pay | Admitting: *Deleted

## 2019-06-24 DIAGNOSIS — S93402A Sprain of unspecified ligament of left ankle, initial encounter: Secondary | ICD-10-CM

## 2019-06-24 DIAGNOSIS — R262 Difficulty in walking, not elsewhere classified: Secondary | ICD-10-CM

## 2019-06-24 DIAGNOSIS — M79605 Pain in left leg: Secondary | ICD-10-CM

## 2019-06-24 NOTE — Telephone Encounter (Signed)
Faxed clinical, orders to [facility] for pre-cert and scheduling.

## 2019-06-24 NOTE — Telephone Encounter (Signed)
-----   Message from Marzetta Board, DPM sent at 06/22/2019  1:05 PM EDT ----- Please order MRI left ankle. Dx: left ankle injury. Rule out ligament/tendon tears. Thanks for all of your help.

## 2019-06-28 ENCOUNTER — Ambulatory Visit (INDEPENDENT_AMBULATORY_CARE_PROVIDER_SITE_OTHER): Payer: 59 | Admitting: Podiatry

## 2019-06-28 ENCOUNTER — Other Ambulatory Visit: Payer: Self-pay

## 2019-06-28 ENCOUNTER — Encounter: Payer: Self-pay | Admitting: Podiatry

## 2019-06-28 DIAGNOSIS — M79605 Pain in left leg: Secondary | ICD-10-CM | POA: Diagnosis not present

## 2019-06-28 DIAGNOSIS — S93402A Sprain of unspecified ligament of left ankle, initial encounter: Secondary | ICD-10-CM | POA: Diagnosis not present

## 2019-06-28 NOTE — Patient Instructions (Signed)
Ankle Sprain  An ankle sprain is a stretch or tear in a ligament in the ankle. Ligaments are tissues that connect bones to each other. The two most common types of ankle sprains are:  Inversion sprain. This happens when the foot turns inward and the ankle rolls outward. It affects the ligament on the outside of the foot (lateral ligament).  Eversion sprain. This happens when the foot turns outward and the ankle rolls inward. It affects the ligament on the inner side of the foot (medial ligament). What are the causes? This condition is often caused by accidentally rolling or twisting the ankle. What increases the risk? You are more likely to develop this condition if you play sports. What are the signs or symptoms? Symptoms of this condition include:  Pain in your ankle.  Swelling.  Bruising. This may develop right after you sprain your ankle or 1-2 days later.  Trouble standing or walking, especially when you turn or change directions. How is this diagnosed? This condition is diagnosed with:  A physical exam. During the exam, your health care provider will press on certain parts of your foot and ankle and try to move them in certain ways.  X-ray imaging. These may be taken to see how severe the sprain is and to check for broken bones. How is this treated? This condition may be treated with:  A brace or splint. This is used to keep the ankle from moving until it heals.  An elastic bandage. This is used to support the ankle.  Crutches.  Pain medicine.  Surgery. This may be needed if the sprain is severe.  Physical therapy. This may help to improve the range of motion in the ankle. Follow these instructions at home: If you have a brace or a splint:  Wear the brace or splint as told by your health care provider. Remove it only as told by your health care provider.  Loosen the brace or splint if your toes tingle, become numb, or turn cold and blue.  Keep the brace or  splint clean.  If the brace or splint is not waterproof: ? Do not let it get wet. ? Cover it with a watertight covering when you take a bath or a shower. If you have an elastic bandage (dressing):  Remove it to shower or bathe.  Try not to move your ankle much, but wiggle your toes from time to time. This helps to prevent swelling.  Adjust the dressing to make it more comfortable if it feels too tight.  Loosen the dressing if you have numbness or tingling in your foot, or if your foot becomes cold and blue. Managing pain, stiffness, and swelling   Take over-the-counter and prescription medicines only as told by your health care provider.  For 2-3 days, keep your ankle raised (elevated) above the level of your heart as much as possible.  If directed, put ice on the injured area: ? If you have a removable brace or splint, remove it as told by your health care provider. ? Put ice in a plastic bag. ? Place a towel between your skin and the bag. ? Leave the ice on for 20 minutes, 2-3 times a day. General instructions  Rest your ankle.  Do not use the injured limb to support your body weight until your health care provider says that you can. Use crutches as told by your health care provider.  Do not use any products that contain nicotine or tobacco, such as   cigarettes, e-cigarettes, and chewing tobacco. If you need help quitting, ask your health care provider.  Keep all follow-up visits as told by your health care provider. This is important. Contact a health care provider if:  You have rapidly increasing bruising or swelling.  Your pain is not relieved with medicine. Get help right away if:  Your foot or toes become numb or blue.  You have severe pain that gets worse. Summary  An ankle sprain is a stretch or tear in a ligament in the ankle. Ligaments are tissues that connect bones to each other.  This condition is often caused by accidentally rolling or twisting the  ankle.  Symptoms include pain, swelling, bruising, and trouble walking.  To relieve pain and swelling, put ice on the affected ankle, raise your ankle above the level of your heart, and use an elastic bandage.  Keep all follow-up visits as told by your health care provider. This is important. This information is not intended to replace advice given to you by your health care provider. Make sure you discuss any questions you have with your health care provider. Document Revised: 10/13/2017 Document Reviewed: 06/17/2017 Elsevier Patient Education  2020 [employer]vier Inc.  

## 2019-06-29 NOTE — Progress Notes (Signed)
Subjective:  Colleen Ford is a 54 y.o. female patient who presents to office for follow up left ankle sprain/soft cast removal. Patient relates she was unable to use the crutches. She has been walking on the ankle. She is apprehensive to use antiinflammatories due to GI issues. Patient denies any other pedal complaints.   Her MRI is scheduled for July 22, 2019.  Patient Active Problem List   Diagnosis Date Noted  . Post-menopausal bleeding 08/28/2016  . Hyperlipidemia 07/07/2014  . Angioedema of lips 06/04/2013  . Overweight and obesity(278.0) 06/30/2012  . Diabetes mellitus type 2 without retinopathy ([place]) 11/10/2009  . Anemia 11/10/2009  . Bilateral chronic knee pain 11/10/2009  . Essential hypertension, benign 10/06/2009    Current Outpatient Medications on File Prior to Visit  Medication Sig Dispense Refill  . amLODipine (NORVASC) 10 MG tablet Take 1 tablet by mouth once daily 90 tablet 3  . aspirin EC 81 MG tablet Take 81 mg by mouth daily.    Marland Kitchen atorvastatin (LIPITOR) 40 MG tablet Take 1 tablet by mouth once daily 90 tablet 3  . canagliflozin (INVOKANA) 100 MG TABS tablet Take 1 tablet (100 mg total) by mouth daily. 90 tablet 3  . metFORMIN (GLUCOPHAGE-XR) 750 MG 24 hr tablet Take 1 tablet (750 mg total) by mouth daily with breakfast. 90 tablet 1  . [DISCONTINUED] atorvastatin (LIPITOR) 40 MG tablet Take 1 tablet (40 mg total) by mouth daily. 90 tablet 3  . [DISCONTINUED] metFORMIN (GLUCOPHAGE) 1000 MG tablet Take 1,000 mg by mouth 2 (two) times daily. 180 tablet 0   No current facility-administered medications on file prior to visit.    Allergies  Allergen Reactions  . Lisinopril Swelling    Objective:  General: Colleen Ford is a 54 y.o. AA female in NAD. AAO x 3.   Vascular:  Decreased edema noted left ankle. Dorsalis Pedis and Posterior Tibial pedal pulses palpable, Capillary Fill Time 3 seconds,(+) pedal hair growth bilateral, no edema bilateral lower extremities,  Temperature gradient within normal limits.  Dermatology: No open lesions bilateral lower extremities, no webspace macerations, no ecchymosis bilateral, all nails x 10 are well manicured.  Musculoskeletal:  Tenderness with palpation at left lateral ankle. She still has a visible/palpable bulge anterior to lateral malleolus. She is able to perform dorsiflexion, plantarflexion, inversion and eversion. No pain with calf compression bilateral. Range of motion within normal limits with mild guarding lateral  ankle. Strength within normal limits in all groups bilateral.   Neurology:  Gross sensation intact via light touch bilateral, Protective sensation intact  with Thornell Mule Monofilament to all pedal sites, Position sense intact, vibratory intact bilateral, Deep tendon reflexes within normal limits bilateral, No babinski sign present bilateral. (- )Tinels sign bilateral.   Gait: Antalgic gait  Assessment and Plan: Problem List Items Addressed This Visit    None    Visit Diagnoses    Sprain of left ankle, unspecified ligament, initial encounter    -  Primary   Pain of left lower extremity         -Complete examination performed. -MRI of left ankle is scheduled for July 22, 2019. -Dispensed an ice pack for her to place in a towel and apply 15-20 minutes/day. -Dispensed short walking boot. Her calf did not fit in a long walking boot. -She is apprehensive to take any pain/anti-inflammatories. -Her swelling has decreased. We will place her in a compression anklet and walking boot until she has her MRI of the left ankle. -Patient  to return to office as needed or sooner if condition worsens.  Marzetta Board, DPM

## 2019-07-06 ENCOUNTER — Encounter: Payer: Self-pay | Admitting: Family Medicine

## 2019-07-06 ENCOUNTER — Other Ambulatory Visit: Payer: Self-pay

## 2019-07-06 ENCOUNTER — Ambulatory Visit (INDEPENDENT_AMBULATORY_CARE_PROVIDER_SITE_OTHER): Payer: 59 | Admitting: Family Medicine

## 2019-07-06 VITALS — BP 128/80 | HR 90 | Wt 190.8 lb

## 2019-07-06 DIAGNOSIS — N950 Postmenopausal bleeding: Secondary | ICD-10-CM

## 2019-07-06 DIAGNOSIS — E119 Type 2 diabetes mellitus without complications: Secondary | ICD-10-CM | POA: Diagnosis not present

## 2019-07-06 NOTE — Assessment & Plan Note (Signed)
Not controlled.  Continue current medications.  We discussed amputation risk of invokana that she read about.

## 2019-07-06 NOTE — Assessment & Plan Note (Signed)
No further bleeding.  Cautioned to follow up with Gyn if recurs

## 2019-07-06 NOTE — Progress Notes (Signed)
    SUBJECTIVE:   CHIEF COMPLAINT / HPI:   Diabetes Taking invokana without problems and continuing metformin.  No hypoglycemic feelings.  Has modified diet by cutting down on sweets  Vaginal bleeding No further bleeding  PERTINENT  PMH / [id number] taking all other medications   OBJECTIVE:   BP 128/80   Pulse 90   Wt 190 lb 12.8 oz (86.5 kg)   LMP 12/31/2013   SpO2 97%   BMI 33.43 kg/m   Psych:  Cognition and judgment appear intact. Alert, communicative  and cooperative with normal attention span and concentration. No apparent delusions, illusions, hallucinations   ASSESSMENT/PLAN:   Post-menopausal bleeding No further bleeding.  Cautioned to follow up with Gyn if recurs   Diabetes mellitus type 2 without retinopathy Not controlled.  Continue current medications.  We discussed amputation risk of invokana that she read about.       [name], MD [place]

## 2019-07-06 NOTE — Patient Instructions (Addendum)
Good to see you today!  Thanks for coming in.  Follow up with your eye doctor  Your blood pressure and cholesterol is great  Keep losing weight -   Get a mammogram  Please bring in all your medications next visit  Come back in two months

## 2019-07-08 LAB — MICROALBUMIN / CREATININE URINE RATIO
Creatinine, Urine: 60.8 mg/dL
Microalb/Creat Ratio: 9 mg/g creat (ref 0–29)
Microalbumin, Urine: 5.3 ug/mL

## 2019-07-09 ENCOUNTER — Telehealth: Payer: Self-pay | Admitting: *Deleted

## 2019-07-09 NOTE — Telephone Encounter (Signed)
[facility] - Drusilla Kanner states she received denial of the PA for pt's MRI due to no x-ray report in the 06/28/2019 clinicals. I reviewed and informed Colleen Ford there is a x-ray report in 06/22/2019. Drusilla Kanner states she will contact insurance for appeal and send all clinicals.

## 2019-07-11 ENCOUNTER — Other Ambulatory Visit: Payer: 59

## 2019-07-19 ENCOUNTER — Telehealth: Payer: Self-pay | Admitting: Podiatry

## 2019-07-19 NOTE — Telephone Encounter (Signed)
Pt called and stated she called [facility] and was told that [facility] had to re send the information because the pt insurance denied

## 2019-07-22 ENCOUNTER — Other Ambulatory Visit: Payer: 59

## 2019-07-30 ENCOUNTER — Other Ambulatory Visit: Payer: Self-pay

## 2019-07-30 ENCOUNTER — Ambulatory Visit (INDEPENDENT_AMBULATORY_CARE_PROVIDER_SITE_OTHER): Payer: 59 | Admitting: Podiatry

## 2019-07-30 VITALS — Temp 97.0°F

## 2019-07-30 DIAGNOSIS — M7672 Peroneal tendinitis, left leg: Secondary | ICD-10-CM

## 2019-07-30 DIAGNOSIS — M79605 Pain in left leg: Secondary | ICD-10-CM | POA: Diagnosis not present

## 2019-07-30 DIAGNOSIS — S93492A Sprain of other ligament of left ankle, initial encounter: Secondary | ICD-10-CM

## 2019-07-31 ENCOUNTER — Encounter: Payer: Self-pay | Admitting: Podiatry

## 2019-07-31 NOTE — Progress Notes (Signed)
Subjective:  Patient ID: Colleen Ford, female    DOB: [birth year],  MRN: [id number]  Chief Complaint  Patient presents with  . Follow-up    L ankle sprain. Pt stated, "MRI is scheduled on 08/28/2019. I have asked to be placed on a cancellation list. 3-10/10 pain when I remove my boot and compression sock".     54 y.o. female presents with the above complaint. Patient presents with left lateral ankle sprain that she had injured quite some time while ago. Patient has been acutely treated by Dr. Adah Perl and we have placed her in a boot. However she has not been able to tolerate the boot and not been able to transition from boot to regular sneakers. She has an MRI scheduled for July 24. I believe she'll benefit from moving that MRI scheduled up already. She denies any other acute complaints. She still has the same amount of pain.   Review of Systems: Negative except as noted in the HPI. Denies N/V/F/Ch.  Past Medical History:  Diagnosis Date  . Anemia   . Arthritis    both knees  . Depression   . Diabetes mellitus without complication ([place])   . GERD (gastroesophageal reflux disease)   . Hyperlipidemia   . Hypertension     Current Outpatient Medications:  .  amLODipine (NORVASC) 10 MG tablet, Take 1 tablet by mouth once daily, Disp: 90 tablet, Rfl: 3 .  aspirin EC 81 MG tablet, Take 81 mg by mouth daily., Disp: , Rfl:  .  atorvastatin (LIPITOR) 40 MG tablet, Take 1 tablet by mouth once daily, Disp: 90 tablet, Rfl: 3 .  canagliflozin (INVOKANA) 100 MG TABS tablet, Take 1 tablet (100 mg total) by mouth daily., Disp: 90 tablet, Rfl: 3 .  metFORMIN (GLUCOPHAGE-XR) 750 MG 24 hr tablet, Take 1 tablet (750 mg total) by mouth daily with breakfast., Disp: 90 tablet, Rfl: 1  Social History   Tobacco Use  Smoking Status Former Smoker  . Quit date: 03/02/1995  . Years since quitting: 24.4  Smokeless Tobacco Never Used    Allergies  Allergen Reactions  . Lisinopril Swelling    Objective:   Vitals:   07/30/19 1308  Temp: (!) 97 F (36.1 C)   There is no height or weight on file to calculate BMI. Constitutional Well developed. Well nourished.  Vascular Dorsalis pedis pulses palpable bilaterally. Posterior tibial pulses palpable bilaterally. Capillary refill normal to all digits.  No cyanosis or clubbing noted. Pedal hair growth normal.  Neurologic Normal speech. Oriented to person, place, and time. Epicritic sensation to light touch grossly present bilaterally.  Dermatologic Nails well groomed and normal in appearance. No open wounds. No skin lesions.  Orthopedic:  Pain on palpation to the left lateral ATFL ligament as well as along the course of the peroneal tendon. No pain at the base of the peroneal tendon. Pain with plantarflexion and inversion of the foot active and passive. Mild pain with dorsiflexion and eversion of the foot.   Radiographs: Previous review of the x-ray showed no osseous involvement no fractures noted. No other bony abnormalities noted. Assessment:   1. Sprain of anterior talofibular ligament of left ankle, initial encounter   2. Pain of left lower extremity   3. Peroneal tendinitis of left lower extremity    Plan:  Patient was evaluated and treated and all questions answered.  Left ankle sprain with acute rupture versus peroneal tendinitis/tear -I explained to the patient the etiology of ankle sprain and various treatment  options were discussed. I believe that patient's primary ATFL may have been torn with secondary pain along the course of the peroneal tendon. I believe patient will benefit from MRI evaluation which is scheduled for July 24 however I would like for the MRI to be moved up as this is more of a urgent evaluation and management. -I will contact the [facility] imaging to move up her MRI. -I believe patient will benefit from Tri-Lock ankle brace to help her transition out of cam boot into regular  sneakers. -Tri-Lock ankle brace was dispensed Return in about 4 weeks (around 08/27/2019).

## 2019-08-02 ENCOUNTER — Telehealth: Payer: Self-pay | Admitting: *Deleted

## 2019-08-02 NOTE — Telephone Encounter (Signed)
-----   Message from Felipa Furnace, DPM sent at 07/31/2019 12:52 PM EDT ----- Regarding: Can you move the MRI by calling [facility],  Would you be able to move the MRI for this patient is scheduled for July 24 however she needs a more urgent for possible tear of the peroneal tendon versus the ankle ligament.  Thank you

## 2019-08-12 ENCOUNTER — Ambulatory Visit
Admission: RE | Admit: 2019-08-12 | Discharge: 2019-08-12 | Disposition: A | Discharge status: Home / Self Care | Payer: 59 | Source: Ambulatory Visit | Attending: Podiatry | Admitting: Podiatry

## 2019-08-12 ENCOUNTER — Other Ambulatory Visit: Payer: Self-pay

## 2019-08-12 IMAGING — MR MR ANKLE*L* W/O CM
5 series · 37 of 40 positions shown · non-contrast
Comparison: Left ankle x-rays dated [DATE].

CLINICAL DATA: Persistent ankle pain since twisting injury a month
ago. No prior surgery.

EXAM:
MRI OF THE LEFT ANKLE WITHOUT CONTRAST
TECHNIQUE: Multiplanar, multisequence MR imaging of the ankle was performed. No
intravenous contrast was administered.

[Series 4: T2 fat-sat · axial · 3.0mm · 0.50mm/px · z∈[-78,+51]mm · 8 of 34 slices shown (1 of 2)]
[im 1/34]
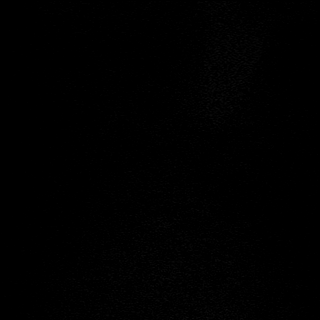
[im 4/34]
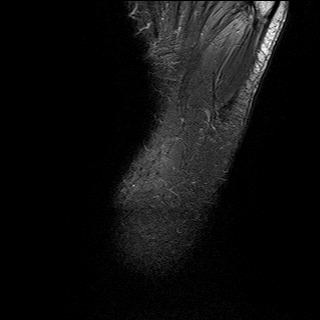
[im 12/34]
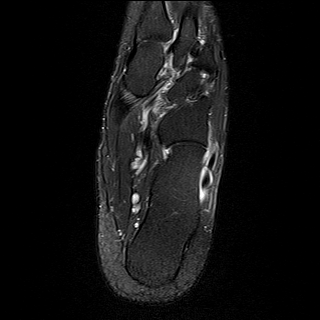
[im 15/34]
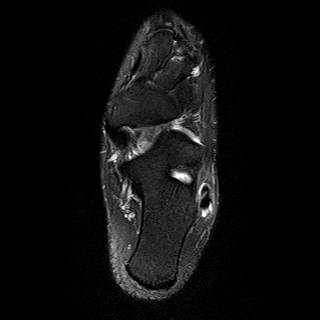
[im 19/34]
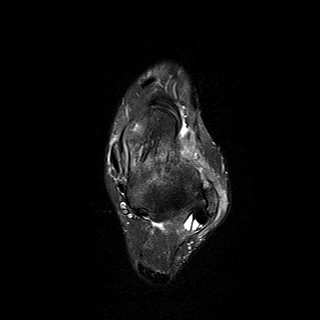
[im 23/34]
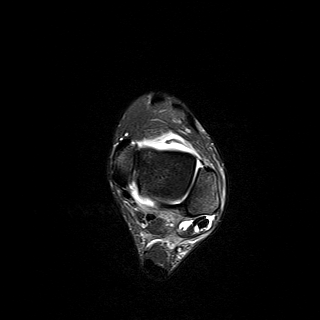
[im 30/34]
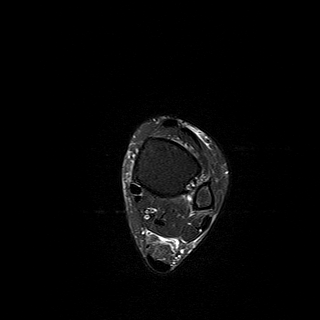
[im 34/34]
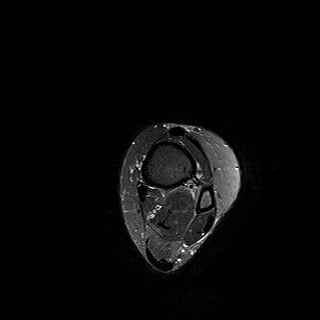

[Series 5: PD fat-sat · axial · 3.0mm · 0.42mm/px · z∈[-74,+47]mm · 9 of 32 slices shown]
[im 1/32]
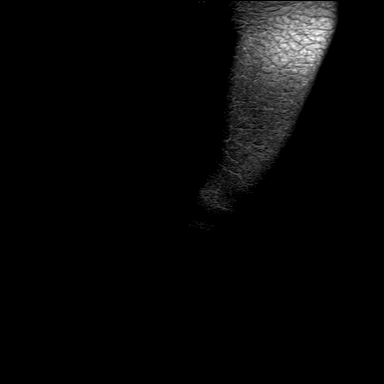
[im 4/32]
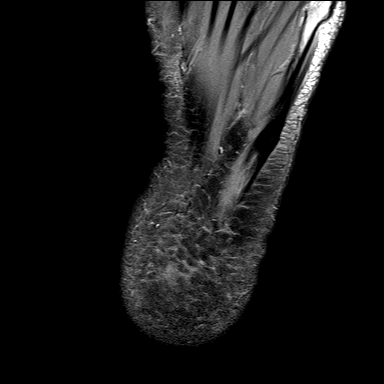
[im 8/32]
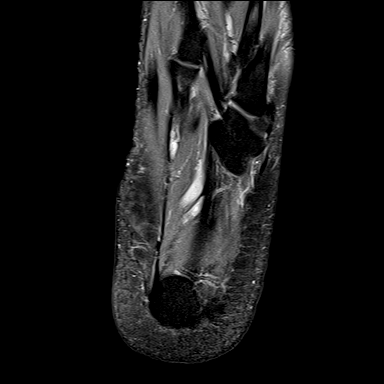
[im 12/32]
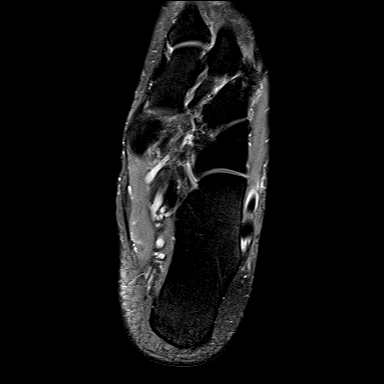
[im 16/32]
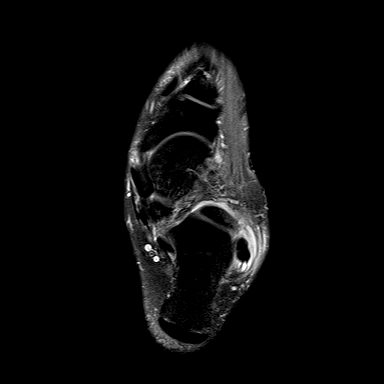
[im 20/32]
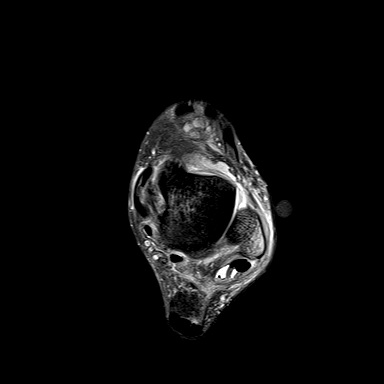
[im 24/32]
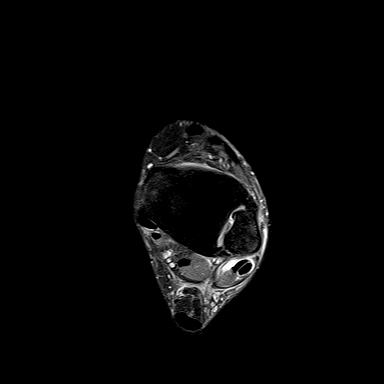
[im 28/32]
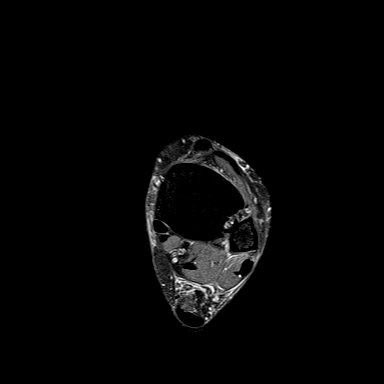
[im 32/32]
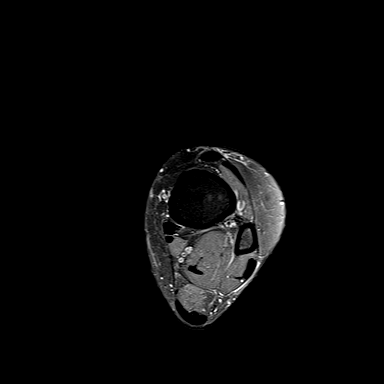

[Series 6: T1 · sagittal · 4.0mm · 0.56mm/px · 5 of 18 slices shown]
[im 1/18]
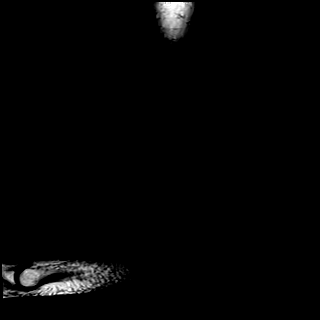
[im 5/18]
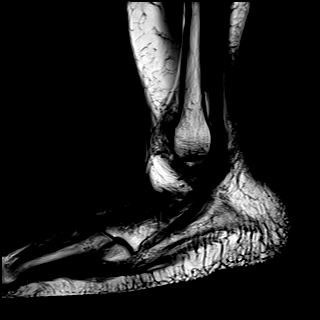
[im 9/18]
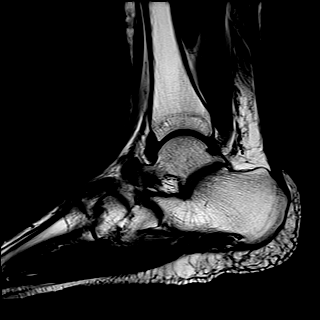
[im 13/18]
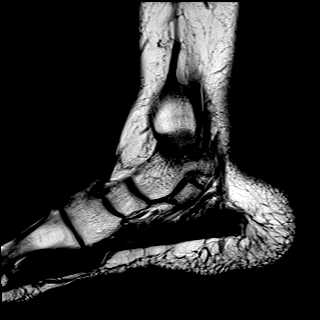
[im 18/18]
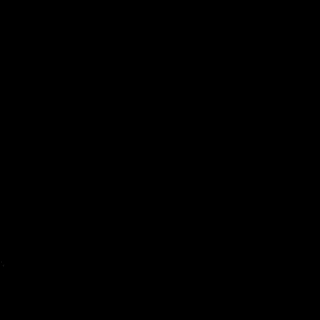

[Series 7: STIR · sagittal · 4.0mm · 0.35mm/px · 4 of 18 slices shown]
[im 1/18]
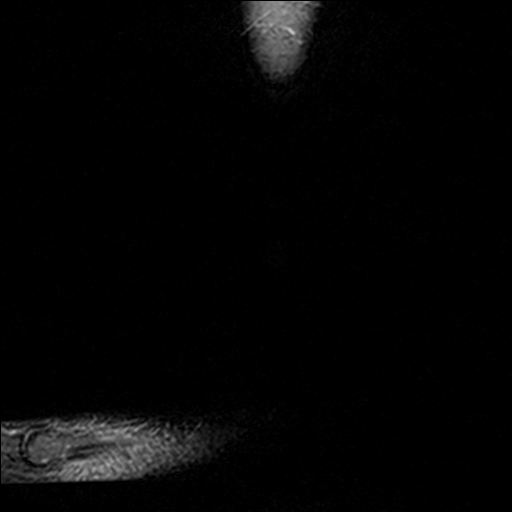
[im 5/18]
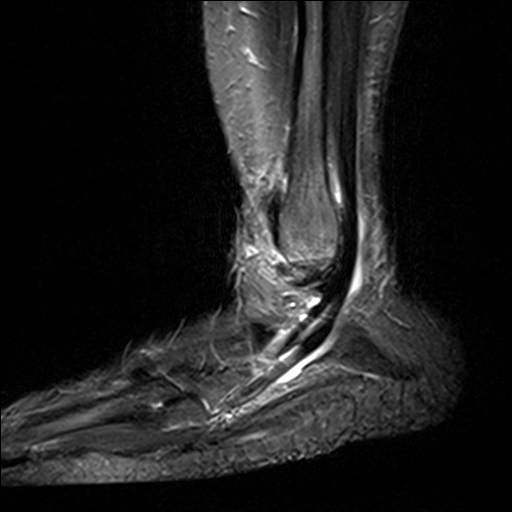
[im 9/18]
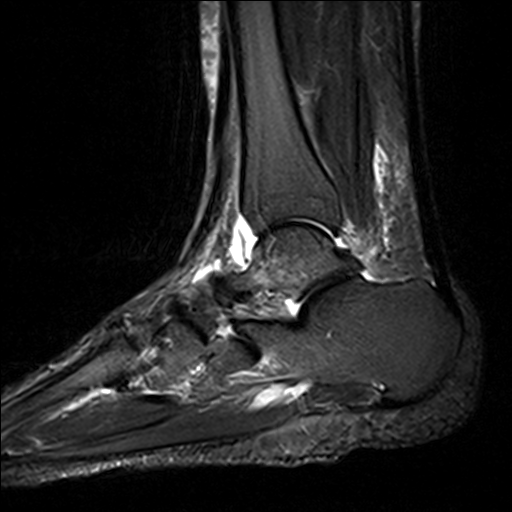
[im 13/18]
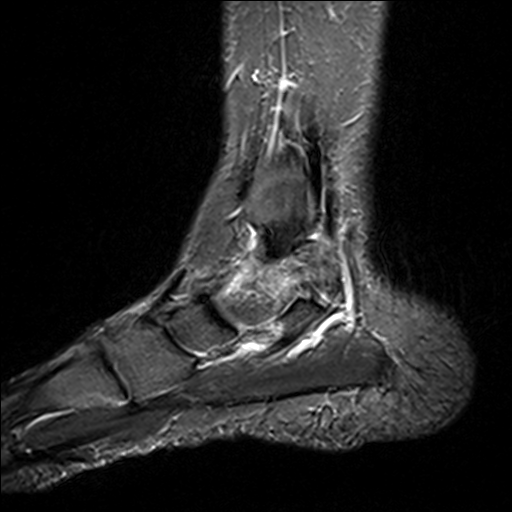

[Series 8: T2 fat-sat · coronal · 3.0mm · 0.50mm/px · 11 of 40 slices shown (2 of 2)]
[im 1/40]
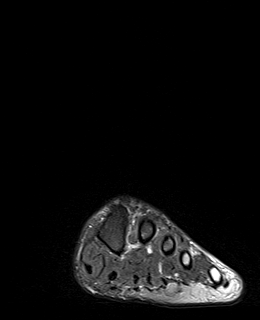
[im 4/40]
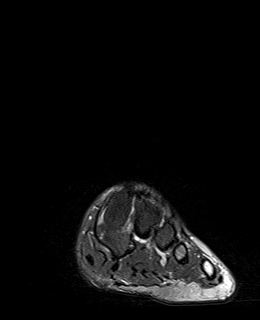
[im 8/40]
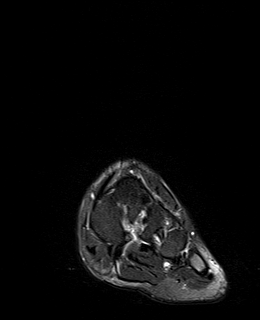
[im 12/40]
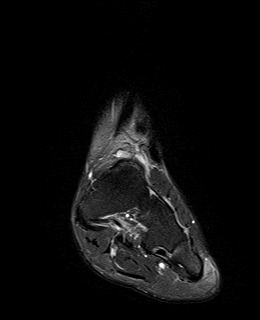
[im 16/40]
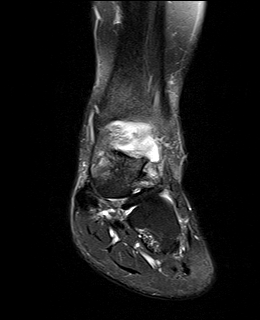
[im 20/40]
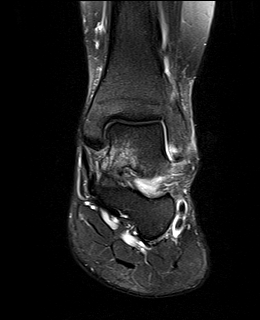
[im 24/40]
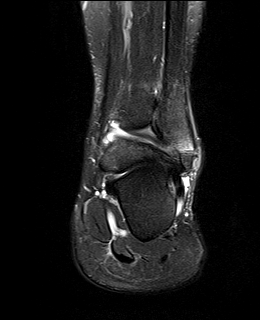
[im 28/40]
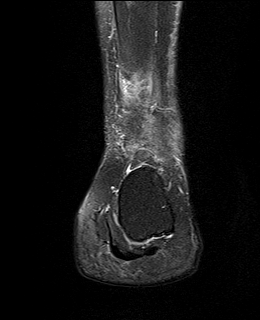
[im 32/40]
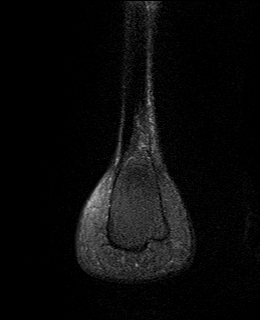
[im 36/40]
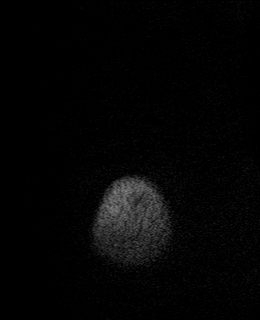
[im 40/40]
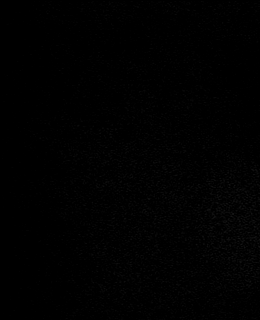

[37 of 40 positions shown; findings below may reference images not displayed]

FINDINGS: TENDONS

Peroneal: Peroneal longus tendon intact with small amount of fluid
in the tendon sheath. Peroneal brevis intact with small amount of
fluid in the tendon sheath.

Posteromedial: Posterior tibial tendon intact. Flexor hallucis
longus tendon intact. Flexor digitorum longus tendon intact.

Anterior: Tibialis anterior tendon intact. Extensor hallucis longus
tendon intact Extensor digitorum longus tendon intact.

Achilles:  Intact.

Plantar Fascia: Intact.

LIGAMENTS

Lateral: Anterior talofibular ligament intact. Calcaneofibular
ligament intact. Posterior talofibular ligament intact. Anterior and
posterior tibiofibular ligaments intact.

Medial: Deltoid ligament intact. Spring ligament intact.

CARTILAGE

Ankle Joint: Small joint effusion. Normal ankle mortise. No chondral
defect.

Subtalar Joints/Sinus Tarsi: Normal subtalar joints. No subtalar
joint effusion. Normal sinus tarsi.

Bones: Subacute, nondisplaced fracture through the tip of the
lateral malleolus (series 6, image 15) with mild surrounding marrow
edema. Mild edema within the talus. No dislocation. No suspicious
bone lesion. Mild second and third TMT joint osteoarthritis.

Soft Tissue: Mild lateral malleolar soft tissue swelling. No soft
tissue mass or fluid collection.
IMPRESSION: 1. Subacute, nondisplaced fracture through the tip of the lateral
malleolus.
2. Mild contusion of the talus.
3. Mild peroneal longus and brevis tenosynovitis.

These results will be called to the ordering clinician or
representative by the Radiologist Assistant, and communication
documented in the PACS or [REDACTED].

## 2019-08-13 ENCOUNTER — Telehealth: Payer: Self-pay | Admitting: Podiatry

## 2019-08-13 NOTE — Telephone Encounter (Signed)
I informed [facility] we did receive the MRI results and I would send to the prescribing podiatrist.

## 2019-08-13 NOTE — Telephone Encounter (Signed)
Truman Hayward from [facility] wanted to make sure that the MRI results were received can someone please confirm with [place]

## 2019-08-14 NOTE — Telephone Encounter (Signed)
Got it.

## 2019-08-20 ENCOUNTER — Ambulatory Visit (INDEPENDENT_AMBULATORY_CARE_PROVIDER_SITE_OTHER): Payer: 59 | Admitting: Podiatry

## 2019-08-20 ENCOUNTER — Other Ambulatory Visit: Payer: Self-pay

## 2019-08-20 DIAGNOSIS — M79605 Pain in left leg: Secondary | ICD-10-CM

## 2019-08-20 DIAGNOSIS — M7672 Peroneal tendinitis, left leg: Secondary | ICD-10-CM

## 2019-08-20 DIAGNOSIS — S93492A Sprain of other ligament of left ankle, initial encounter: Secondary | ICD-10-CM

## 2019-08-20 NOTE — Progress Notes (Signed)
Subjective:  Patient ID: Colleen Ford, female    DOB: 11/08/1965,  MRN: [id number]  Chief Complaint  Patient presents with  . Follow-up    MRI Results/nail trim    54 y.o. female presents with the above complaint. Patient presents with left lateral ankle sprain that she had injured quite some time while ago. Patient has been acutely treated by Dr. Adah Perl and we have placed her in a boot. However she has not been able to tolerate the boot and not been able to transition from boot to regular sneakers. She has an MRI scheduled for July 24. I believe she'll benefit from moving that MRI scheduled up already. She denies any other acute complaints. She still has the same amount of pain.   Review of Systems: Negative except as noted in the HPI. Denies N/V/F/Ch.  Past Medical History:  Diagnosis Date  . Anemia   . Arthritis    both knees  . Depression   . Diabetes mellitus without complication ([place])   . GERD (gastroesophageal reflux disease)   . Hyperlipidemia   . Hypertension     Current Outpatient Medications:  .  amLODipine (NORVASC) 10 MG tablet, Take 1 tablet by mouth once daily, Disp: 90 tablet, Rfl: 3 .  aspirin EC 81 MG tablet, Take 81 mg by mouth daily., Disp: , Rfl:  .  atorvastatin (LIPITOR) 40 MG tablet, Take 1 tablet by mouth once daily, Disp: 90 tablet, Rfl: 3 .  canagliflozin (INVOKANA) 100 MG TABS tablet, Take 1 tablet (100 mg total) by mouth daily., Disp: 90 tablet, Rfl: 3 .  metFORMIN (GLUCOPHAGE-XR) 750 MG 24 hr tablet, Take 1 tablet (750 mg total) by mouth daily with breakfast., Disp: 90 tablet, Rfl: 1  Social History   Tobacco Use  Smoking Status Former Smoker  . Quit date: 03/02/1995  . Years since quitting: 24.4  Smokeless Tobacco Never Used    Allergies  Allergen Reactions  . Lisinopril Swelling   Objective:   There were no vitals filed for this visit. There is no height or weight on file to calculate BMI. Constitutional Well developed. Well  nourished.  Vascular Dorsalis pedis pulses palpable bilaterally. Posterior tibial pulses palpable bilaterally. Capillary refill normal to all digits.  No cyanosis or clubbing noted. Pedal hair growth normal.  Neurologic Normal speech. Oriented to person, place, and time. Epicritic sensation to light touch grossly present bilaterally.  Dermatologic Nails well groomed and normal in appearance. No open wounds. No skin lesions.  Orthopedic:  Pain on palpation to the left lateral ATFL ligament as well as along the course of the peroneal tendon. No pain at the base of the peroneal tendon. Pain with plantarflexion and inversion of the foot active and passive. Mild pain with dorsiflexion and eversion of the foot.   Radiographs: Previous review of the x-ray showed no osseous involvement no fractures noted. No other bony abnormalities noted. Assessment:   1. Peroneal tendinitis of left lower extremity   2. Pain of left lower extremity   3. Sprain of anterior talofibular ligament of left ankle, initial encounter    Plan:  Patient was evaluated and treated and all questions answered.  Left ankle sprain with possible tearing of the peroneal tendons with underlying peroneal tendinitis -MRI was discussed extensively in detail with the patient and correlated clinically.  Clinically patient has pain along the course of the peroneal tendon and seems to be very painful to touch.  She has been able to control it with Tri-Lock  ankle brace. -I discussed with her conservative as well as surgical options.  At this time patient would like to focus on conservative options.  I discussed with her that she may benefit from a steroid injection to decrease the acute inflammatory component associated with pain.  If there is no improvement with steroid then we will discuss surgical interventions to help address the tendinitis.  I also discussed with her that there is a high risk of rupture associated with steroid  injections near the area.  Patient states understanding like to proceed despite the risks. -A steroid injection was performed at left lateral foot a point of maximal tenderness using 1% plain Lidocaine and 10 mg of Kenalog. This was well tolerated. -Continue the Tri-Lock brace. -If no improvement will consider surgical interventions. No follow-ups on file.

## 2019-08-28 ENCOUNTER — Other Ambulatory Visit: Payer: 59

## 2019-08-31 ENCOUNTER — Ambulatory Visit: Payer: 59 | Admitting: Podiatry

## 2019-09-20 ENCOUNTER — Ambulatory Visit (INDEPENDENT_AMBULATORY_CARE_PROVIDER_SITE_OTHER): Payer: 59 | Admitting: Podiatry

## 2019-09-20 ENCOUNTER — Other Ambulatory Visit: Payer: Self-pay

## 2019-09-20 DIAGNOSIS — M7672 Peroneal tendinitis, left leg: Secondary | ICD-10-CM | POA: Diagnosis not present

## 2019-09-20 DIAGNOSIS — M79605 Pain in left leg: Secondary | ICD-10-CM

## 2019-09-21 ENCOUNTER — Encounter: Payer: Self-pay | Admitting: Podiatry

## 2019-09-21 NOTE — Progress Notes (Signed)
Subjective:  Patient ID: Colleen Ford, female    DOB: [birth year],  MRN: [id number]  Chief Complaint  Patient presents with  . Foot Pain    pt is here for a 3 week f/u on tendinitis.    54 y.o. female presents with the above complaint.  Patient presents with follow-up of left peroneal tendinitis.  Patient states the pain has not resolved.  She has failed cam boot immobilization, injection, bracing.  Patient states that her pain scale is 5 out of 7.  And 10 when she is walking on it.  She states that she still has a lot of pain.  I discussed MRI findings with her during last clinical visit which showed tearing along the tendon.  She denies any other acute complaints.  She would like to discuss surgical intervention at this time given that she has failed all conservative treatment options.  Review of Systems: Negative except as noted in the HPI. Denies N/V/F/Ch.  Past Medical History:  Diagnosis Date  . Anemia   . Arthritis    both knees  . Depression   . Diabetes mellitus without complication ([place])   . GERD (gastroesophageal reflux disease)   . Hyperlipidemia   . Hypertension     Current Outpatient Medications:  .  amLODipine (NORVASC) 10 MG tablet, Take 1 tablet by mouth once daily, Disp: 90 tablet, Rfl: 3 .  aspirin EC 81 MG tablet, Take 81 mg by mouth daily., Disp: , Rfl:  .  atorvastatin (LIPITOR) 40 MG tablet, Take 1 tablet by mouth once daily, Disp: 90 tablet, Rfl: 3 .  canagliflozin (INVOKANA) 100 MG TABS tablet, Take 1 tablet (100 mg total) by mouth daily., Disp: 90 tablet, Rfl: 3 .  metFORMIN (GLUCOPHAGE-XR) 750 MG 24 hr tablet, Take 1 tablet (750 mg total) by mouth daily with breakfast., Disp: 90 tablet, Rfl: 1  Social History   Tobacco Use  Smoking Status Former Smoker  . Quit date: 03/02/1995  . Years since quitting: 24.5  Smokeless Tobacco Never Used    Allergies  Allergen Reactions  . Lisinopril Swelling   Objective:   There were no vitals filed for  this visit. There is no height or weight on file to calculate BMI. Constitutional Well developed. Well nourished.  Vascular Dorsalis pedis pulses palpable bilaterally. Posterior tibial pulses palpable bilaterally. Capillary refill normal to all digits.  No cyanosis or clubbing noted. Pedal hair growth normal.  Neurologic Normal speech. Oriented to person, place, and time. Epicritic sensation to light touch grossly present bilaterally.  Dermatologic Nails well groomed and normal in appearance. No open wounds. No skin lesions.  Orthopedic:  Pain on palpation to the left lateral ATFL ligament as well as along the course of the peroneal tendon. No pain at the base of the peroneal tendon. Pain with plantarflexion and inversion of the foot active and passive. Mild pain with dorsiflexion and eversion of the foot.   Radiographs: Previous review of the x-ray showed no osseous involvement no fractures noted. No other bony abnormalities noted.  IMPRESSION: 1. Subacute, nondisplaced fracture through the tip of the lateral malleolus. 2. Mild contusion of the talus. 3. Mild peroneal longus and brevis tenosynovitis Assessment:   1. Peroneal tendinitis of left lower extremity   2. Pain of left lower extremity    Plan:  Patient was evaluated and treated and all questions answered.  Left peroneal tendinitis with concern for tenosynovitis/tear -MRI was discussed extensively in detail with the patient and correlated clinically.  Even though the MRI may show mild peroneal longus or brevis tenosynovitis I believe that there might be more to with that the MRI is able to show.  I discussed with the patient that generally there is more inflammation than what the MRI is able to pick up and given that sharp pain has not resolved I believe patient will benefit from surgical intervention.  I discussed my surgical plan with the patient which included left peroneal tendon tenosynovectomy with repair of any  longitudinal tearing.  I discussed my postop protocol which included a component of nonweightbearing, weightbearing as tolerated in a cam boot versus regular shoes.  Patient states understanding.  I instructed her to get a knee scooter.  Patient states that she will obtain it. -Informed surgical risk consent was reviewed and read aloud to the patient.  I reviewed the films.  I have discussed my findings with the patient in great detail.  I have discussed all risks including but not limited to infection, stiffness, scarring, limp, disability, deformity, damage to blood vessels and nerves, numbness, poor healing, need for braces, arthritis, chronic pain, amputation, death.  All benefits and realistic expectations discussed in great detail.  I have made no promises as to the outcome.  I have provided realistic expectations.  I have offered the patient a 2nd opinion, which they have declined and assured me they preferred to proceed despite the risks -A total of 31 minutes was spent in direct patient care as well as pre and post patient encounter activities.  This includes documentation as well as reviewing patient chart for labs, imaging, past medical, surgical, social, and family history as documented in the EMR.  I have reviewed medication allergies as documented in EMR.  I discussed the etiology of condition and treatment options from conservative to surgical care.  All risks and benefit of the treatment course was discussed in detail.  All questions were answered and return appointment was discussed.  Since the visit completed in an ambulatory/outpatient setting, the patient and/or parent/guardian has been advised to contact the providers office for worsening condition and seek medical treatment and/or call 911 if the patient deems either is necessary.  No follow-ups on file.

## 2019-09-29 ENCOUNTER — Telehealth: Payer: Self-pay | Admitting: Podiatry

## 2019-09-29 NOTE — Telephone Encounter (Signed)
My job needs information for my leave of absence for surgery. If you could please send them a fax with this information so we can get this on the road. The fax number is [phone]. I received a message in [place], didn't know if that was from you or what, but they need information faxed over to them. Thank you.

## 2019-09-29 NOTE — Telephone Encounter (Signed)
I need you to fax information to my job that I'm having sx including the sx date and everything. That information can be faxed to [phone].

## 2019-09-30 ENCOUNTER — Telehealth: Payer: Self-pay | Admitting: Podiatry

## 2019-09-30 ENCOUNTER — Encounter: Payer: Self-pay | Admitting: Podiatry

## 2019-09-30 NOTE — Telephone Encounter (Signed)
DOS: 10/04/2019   Repair Peroneal Tendon Lt [phone])  [facility] Effective: 02/05/2019 - 11/04/2019  Deductible: $0 Out of Pocket: $1,500 with $0 met and $1,500 remains. CoInsurance: 20%  Per [facility] no prior authorization is required. Ref# [id number]

## 2019-10-04 DIAGNOSIS — M7672 Peroneal tendinitis, left leg: Secondary | ICD-10-CM

## 2019-10-04 MED ORDER — OXYCODONE-ACETAMINOPHEN 10-325 MG PO TABS: 1.0000 | ORAL_TABLET | Freq: Four times a day (QID) | ORAL | 0 refills | Status: AC | PRN

## 2019-10-04 MED ORDER — IBUPROFEN 800 MG PO TABS: 800.0000 mg | ORAL_TABLET | Freq: Four times a day (QID) | ORAL | 1 refills | Status: DC | PRN

## 2019-10-04 NOTE — Addendum Note (Signed)
Addended by: Boneta Lucks on: 10/04/2019 07:30 AM   Modules accepted: Orders

## 2019-10-07 DIAGNOSIS — M79676 Pain in unspecified toe(s): Secondary | ICD-10-CM

## 2019-10-13 ENCOUNTER — Other Ambulatory Visit: Payer: Self-pay

## 2019-10-13 ENCOUNTER — Ambulatory Visit (INDEPENDENT_AMBULATORY_CARE_PROVIDER_SITE_OTHER): Payer: 59 | Admitting: Podiatry

## 2019-10-13 VITALS — Temp 97.3°F

## 2019-10-13 DIAGNOSIS — Z9889 Other specified postprocedural states: Secondary | ICD-10-CM

## 2019-10-13 DIAGNOSIS — M79676 Pain in unspecified toe(s): Secondary | ICD-10-CM

## 2019-10-13 DIAGNOSIS — M7672 Peroneal tendinitis, left leg: Secondary | ICD-10-CM

## 2019-10-15 ENCOUNTER — Encounter: Payer: Self-pay | Admitting: Podiatry

## 2019-10-15 NOTE — Progress Notes (Signed)
  Subjective:  Patient ID: Colleen Ford, female    DOB: 1965/08/08,  MRN: [id number]  Chief Complaint  Patient presents with  . Routine Post Op    pov#1 dos 08.30.2021 Peroneal Tendon Repair Lt. Pt stated that her only concern is when she elevates her foot it seems like it swells. Denies fever/chills along with nauseau and vomitting     54 y.o. female returns for post-op check.  Patient states to doing well.  She denies any acute pain.  Her pain is well controlled.  She has been nonweightbearing to the left lower extremity.  She denies any other acute complaints.  Review of Systems: Negative except as noted in the HPI. Denies N/V/F/Ch.  Past Medical History:  Diagnosis Date  . Anemia   . Arthritis    both knees  . Depression   . Diabetes mellitus without complication ([place])   . GERD (gastroesophageal reflux disease)   . Hyperlipidemia   . Hypertension     Current Outpatient Medications:  .  amLODipine (NORVASC) 10 MG tablet, Take 1 tablet by mouth once daily, Disp: 90 tablet, Rfl: 3 .  aspirin EC 81 MG tablet, Take 81 mg by mouth daily., Disp: , Rfl:  .  atorvastatin (LIPITOR) 40 MG tablet, Take 1 tablet by mouth once daily, Disp: 90 tablet, Rfl: 3 .  canagliflozin (INVOKANA) 100 MG TABS tablet, Take 1 tablet (100 mg total) by mouth daily., Disp: 90 tablet, Rfl: 3 .  ibuprofen (ADVIL) 800 MG tablet, Take 1 tablet (800 mg total) by mouth every 6 (six) hours as needed., Disp: 60 tablet, Rfl: 1 .  metFORMIN (GLUCOPHAGE-XR) 750 MG 24 hr tablet, Take 1 tablet (750 mg total) by mouth daily with breakfast., Disp: 90 tablet, Rfl: 1  Social History   Tobacco Use  Smoking Status Former Smoker  . Quit date: 03/02/1995  . Years since quitting: 24.6  Smokeless Tobacco Never Used    Allergies  Allergen Reactions  . Lisinopril Swelling   Objective:   Vitals:   10/13/19 1149  Temp: (!) 97.3 F (36.3 C)   There is no height or weight on file to calculate BMI. Constitutional  Well developed. Well nourished.  Vascular Foot warm and well perfused. Capillary refill normal to all digits.   Neurologic Normal speech. Oriented to person, place, and time. Epicritic sensation to light touch grossly present bilaterally.  Dermatologic Skin healing well without signs of infection. Skin edges well coapted without signs of infection.  Orthopedic: Tenderness to palpation noted about the surgical site.   Radiographs: None Assessment:   1. Peroneal tendinitis of left lower extremity   2. Status post foot surgery    Plan:  Patient was evaluated and treated and all questions answered.  S/p foot surgery left -Progressing as expected post-operatively. -XR: None -WB Status: Nonweightbearing left lower extremity in cam boot -Sutures: Intact.  No signs of dehiscence noted.  No clinical signs of infection noted. -Medications: None -Foot redressed.  No follow-ups on file.

## 2019-10-20 ENCOUNTER — Ambulatory Visit (INDEPENDENT_AMBULATORY_CARE_PROVIDER_SITE_OTHER): Payer: 59 | Admitting: Podiatry

## 2019-10-20 ENCOUNTER — Other Ambulatory Visit: Payer: Self-pay

## 2019-10-20 DIAGNOSIS — Z9889 Other specified postprocedural states: Secondary | ICD-10-CM

## 2019-10-20 DIAGNOSIS — M7672 Peroneal tendinitis, left leg: Secondary | ICD-10-CM

## 2019-10-22 ENCOUNTER — Encounter: Payer: Self-pay | Admitting: Podiatry

## 2019-10-22 NOTE — Progress Notes (Signed)
  Subjective:  Patient ID: Colleen Ford, female    DOB: [birth year],  MRN: [id number]  Chief Complaint  Patient presents with  . Routine Post Op    [name] but no other concerns      54 y.o. female returns for post-op check.  Patient states to doing well.  She denies any acute pain.  Her pain is well controlled.  She has been nonweightbearing to the left lower extremity.  She denies any other acute complaints.  Review of Systems: Negative except as noted in the HPI. Denies N/V/F/Ch.  Past Medical History:  Diagnosis Date  . Anemia   . Arthritis    both knees  . Depression   . Diabetes mellitus without complication ([place])   . GERD (gastroesophageal reflux disease)   . Hyperlipidemia   . Hypertension     Current Outpatient Medications:  .  amLODipine (NORVASC) 10 MG tablet, Take 1 tablet by mouth once daily, Disp: 90 tablet, Rfl: 3 .  aspirin EC 81 MG tablet, Take 81 mg by mouth daily., Disp: , Rfl:  .  atorvastatin (LIPITOR) 40 MG tablet, Take 1 tablet by mouth once daily, Disp: 90 tablet, Rfl: 3 .  canagliflozin (INVOKANA) 100 MG TABS tablet, Take 1 tablet (100 mg total) by mouth daily., Disp: 90 tablet, Rfl: 3 .  ibuprofen (ADVIL) 800 MG tablet, Take 1 tablet (800 mg total) by mouth every 6 (six) hours as needed., Disp: 60 tablet, Rfl: 1 .  metFORMIN (GLUCOPHAGE-XR) 750 MG 24 hr tablet, Take 1 tablet (750 mg total) by mouth daily with breakfast., Disp: 90 tablet, Rfl: 1  Social History   Tobacco Use  Smoking Status Former Smoker  . Quit date: 03/02/1995  . Years since quitting: 24.6  Smokeless Tobacco Never Used    Allergies  Allergen Reactions  . Lisinopril Swelling   Objective:   There were no vitals filed for this visit. There is no height or weight on file to calculate BMI. Constitutional Well developed. Well nourished.  Vascular Foot warm and well perfused. Capillary refill normal to all digits.   Neurologic Normal  speech. Oriented to person, place, and time. Epicritic sensation to light touch grossly present bilaterally.  Dermatologic Skin healing well without signs of infection. Skin edges well coapted without signs of infection.  Orthopedic: Tenderness to palpation noted about the surgical site.   Radiographs: None Assessment:   1. Peroneal tendinitis of left lower extremity   2. Status post foot surgery    Plan:  Patient was evaluated and treated and all questions answered.  S/p foot surgery left -Progressing as expected post-operatively. -XR: None -WB Status: Nonweightbearing left lower extremity in cam boot -Sutures: Sutures removed.  No complication noted.  No dehiscence noted.  No clinical signs of infection noted. -Medications: None -I will discuss physical therapy as well is weightbearing as tolerated in cam boot during next clinical visit.  No follow-ups on file.

## 2019-11-03 ENCOUNTER — Encounter: Payer: Self-pay | Admitting: Family Medicine

## 2019-11-03 ENCOUNTER — Ambulatory Visit (INDEPENDENT_AMBULATORY_CARE_PROVIDER_SITE_OTHER): Payer: 59 | Admitting: Podiatry

## 2019-11-03 ENCOUNTER — Other Ambulatory Visit: Payer: Self-pay

## 2019-11-03 ENCOUNTER — Ambulatory Visit (INDEPENDENT_AMBULATORY_CARE_PROVIDER_SITE_OTHER): Payer: 59 | Admitting: Family Medicine

## 2019-11-03 VITALS — BP 124/72 | HR 82 | Wt 183.4 lb

## 2019-11-03 DIAGNOSIS — D649 Anemia, unspecified: Secondary | ICD-10-CM

## 2019-11-03 DIAGNOSIS — Z23 Encounter for immunization: Secondary | ICD-10-CM

## 2019-11-03 DIAGNOSIS — E119 Type 2 diabetes mellitus without complications: Secondary | ICD-10-CM | POA: Diagnosis not present

## 2019-11-03 DIAGNOSIS — Z9889 Other specified postprocedural states: Secondary | ICD-10-CM

## 2019-11-03 DIAGNOSIS — M7672 Peroneal tendinitis, left leg: Secondary | ICD-10-CM

## 2019-11-03 DIAGNOSIS — I10 Essential (primary) hypertension: Secondary | ICD-10-CM | POA: Diagnosis not present

## 2019-11-03 LAB — POCT GLYCOSYLATED HEMOGLOBIN (HGB A1C): HbA1c, POC (controlled diabetic range): 8.9 % — AB (ref 0.0–7.0)

## 2019-11-03 MED ORDER — METFORMIN HCL ER 750 MG PO TB24
750.0000 mg | ORAL_TABLET | Freq: Two times a day (BID) | ORAL | 1 refills | Status: DC
Start: 2019-11-03 — End: 2019-12-15

## 2019-11-03 NOTE — Patient Instructions (Addendum)
Good to see you today!  Thanks for coming in.  For the diabetes - take the metformin twice a day - continue taking invokana once a day - ask your [facility] physician to call me - [phone]  Come back in one month and bring your blood sugar readings  You need a mammogram to prevent breast cancer.  Please schedule an appointment.  You can call [phone].    You need an diabetes eye exam every year.  Please see your eye doctor.  Ask them to fax [place] a report of your exam

## 2019-11-03 NOTE — Progress Notes (Signed)
    SUBJECTIVE:   CHIEF COMPLAINT / HPI:   DIABETES Disease Monitoring: Blood Sugar range-Not checking although just got a meter from an organization her insurance suggested   Medications Adherence [facility] the organization who is helping with her diet and supplying monitors asked her to stop invokana for unclear reasons.  She takes metformin once daily Hypoglycemic symptoms- no  HYPERTENSION Home BP Monitoring readings: Not checking  Chest pain- no     Medication Adherence  Brings in her meds. Lightheadedness-  no  Edema- no    PERTINENT  PMH / PSH:   OBJECTIVE:   BP 124/72   Pulse 82   Wt 183 lb 6.4 oz (83.2 kg)   LMP 12/31/2013   SpO2 99%   BMI 32.13 kg/m   Heart - Regular rate and rhythm.  No murmurs, gallops or rubs.    Lungs:  Normal respiratory effort, chest expands symmetrically. Lungs are clear to auscultation, no crackles or wheezes.   ASSESSMENT/PLAN:   Anemia Appears to have borderline iron def with low mcv but maintaining normal hgb level.  Will monitor   Essential hypertension, benign BP Readings from Last 3 Encounters:  11/03/19 124/72  07/06/19 128/80  06/01/19 113/75   Controlled - continue current medications   Diabetes mellitus type 2 without retinopathy Not at goal.   See after visit summary.  Increase metformin to twice a day and continue invokana.  She will ask her diabetes coaches to call me,  Her weight has been improving hopefully this will continue      [name], [place]

## 2019-11-03 NOTE — Assessment & Plan Note (Signed)
Appears to have borderline iron def with low mcv but maintaining normal hgb level.  Will monitor

## 2019-11-03 NOTE — Assessment & Plan Note (Signed)
Not at goal.   See after visit summary.  Increase metformin to twice a day and continue invokana.  She will ask her diabetes coaches to call me,  Her weight has been improving hopefully this will continue

## 2019-11-03 NOTE — Assessment & Plan Note (Signed)
BP Readings from Last 3 Encounters:  11/03/19 124/72  07/06/19 128/80  06/01/19 113/75   Controlled - continue current medications

## 2019-11-04 ENCOUNTER — Encounter: Payer: Self-pay | Admitting: Podiatry

## 2019-11-04 NOTE — Progress Notes (Signed)
  Subjective:  Patient ID: Colleen Ford, female    DOB: [birth year],  MRN: [id number]  Chief Complaint  Patient presents with  . Routine Post Op    POV#3 DOS 8.30.2021 Peroneal tendon repair lt. Pt states healing well and doing good overall, some occasional left lateral aspect pain on her foot. Denies fever/chills/nausea/vomiting.     54 y.o. female returns for post-op check.  Patient is doing well.  She does not have any pain.  She has been nonweightbearing to the left lower extremity.  She is ready to transition today.  She denies any other acute complaints  Review of Systems: Negative except as noted in the HPI. Denies N/V/F/Ch.  Past Medical History:  Diagnosis Date  . Anemia   . Arthritis    both knees  . Depression   . Diabetes mellitus without complication ([place])   . GERD (gastroesophageal reflux disease)   . Hyperlipidemia   . Hypertension     Current Outpatient Medications:  .  amLODipine (NORVASC) 10 MG tablet, Take 1 tablet by mouth once daily, Disp: 90 tablet, Rfl: 3 .  aspirin EC 81 MG tablet, Take 81 mg by mouth daily., Disp: , Rfl:  .  atorvastatin (LIPITOR) 40 MG tablet, Take 1 tablet by mouth once daily, Disp: 90 tablet, Rfl: 3 .  canagliflozin (INVOKANA) 100 MG TABS tablet, Take 1 tablet (100 mg total) by mouth daily. (Patient not taking: Reported on 11/03/2019), Disp: 90 tablet, Rfl: 3 .  ibuprofen (ADVIL) 800 MG tablet, Take 1 tablet (800 mg total) by mouth every 6 (six) hours as needed., Disp: 60 tablet, Rfl: 1 .  metFORMIN (GLUCOPHAGE-XR) 750 MG 24 hr tablet, Take 1 tablet (750 mg total) by mouth in the morning and at bedtime., Disp: 180 tablet, Rfl: 1  Social History   Tobacco Use  Smoking Status Former Smoker  . Quit date: 03/02/1995  . Years since quitting: 24.6  Smokeless Tobacco Never Used    Allergies  Allergen Reactions  . Lisinopril Swelling   Objective:   There were no vitals filed for this visit. There is no height or weight on file to  calculate BMI. Constitutional Well developed. Well nourished.  Vascular Foot warm and well perfused. Capillary refill normal to all digits.   Neurologic Normal speech. Oriented to person, place, and time. Epicritic sensation to light touch grossly present bilaterally.  Dermatologic  skin completely reepithelialized.  No signs of infection noted.  No pain on palpation noted.  Orthopedic:  No tenderness to palpation noted about the surgical site.   Radiographs: None Assessment:   1. Peroneal tendinitis of left lower extremity   2. Status post foot surgery    Plan:  Patient was evaluated and treated and all questions answered.  S/p foot surgery left -Progressing as expected post-operatively. -XR: None -WB Status: Transition to weightbearing as tolerated in a cam boot to the left lower extremity -Sutures: None -Medications: None -Prescription for [facility] was given.  I believe she will benefit from physical therapy to help with general deconditioning as well as improve range of motion exercises especially with the peroneal tendon.  Patient states understanding  No follow-ups on file.

## 2019-11-08 ENCOUNTER — Telehealth: Payer: Self-pay | Admitting: Podiatry

## 2019-11-08 NOTE — Telephone Encounter (Signed)
She should be able to take that script for [facility] and use it there. If not, then I can hand write the script which she would have to come pick up at the office

## 2019-11-08 NOTE — Telephone Encounter (Signed)
Patient called stating she wants a referral to [facility].

## 2019-11-09 ENCOUNTER — Telehealth: Payer: Self-pay | Admitting: Podiatry

## 2019-11-09 NOTE — Telephone Encounter (Signed)
Can you please write her another script and she will come by the office to pick it up. She wants to go to [facility].

## 2019-11-12 NOTE — Telephone Encounter (Signed)
I have the script whenever she wants it. I can leave it with you?

## 2019-11-29 ENCOUNTER — Encounter: Payer: Self-pay | Admitting: Family Medicine

## 2019-12-01 ENCOUNTER — Encounter: Payer: Self-pay | Admitting: Podiatry

## 2019-12-01 ENCOUNTER — Ambulatory Visit (INDEPENDENT_AMBULATORY_CARE_PROVIDER_SITE_OTHER): Payer: 59 | Admitting: Podiatry

## 2019-12-01 ENCOUNTER — Other Ambulatory Visit: Payer: Self-pay

## 2019-12-01 DIAGNOSIS — Z9889 Other specified postprocedural states: Secondary | ICD-10-CM

## 2019-12-01 DIAGNOSIS — M7672 Peroneal tendinitis, left leg: Secondary | ICD-10-CM | POA: Diagnosis not present

## 2019-12-02 ENCOUNTER — Encounter: Payer: Self-pay | Admitting: Podiatry

## 2019-12-02 NOTE — Progress Notes (Signed)
  Subjective:  Patient ID: Colleen Ford, female    DOB: Jun 26, 1965,  MRN: [id number]  Chief Complaint  Patient presents with  . Routine Post Op    PT stated that she is doing great and that physical therapy is going good. Stated that she still has some numbness and the scar itself is sore      54 y.o. female returns for post-op check.  Patient is doing well.  She does not have any pain.  She has been nonweightbearing to the left lower extremity.  She is ready to transition today.  She denies any other acute complaints  Review of Systems: Negative except as noted in the HPI. Denies N/V/F/Ch.  Past Medical History:  Diagnosis Date  . Anemia   . Arthritis    both knees  . Depression   . Diabetes mellitus without complication ([place])   . GERD (gastroesophageal reflux disease)   . Hyperlipidemia   . Hypertension     Current Outpatient Medications:  .  amLODipine (NORVASC) 10 MG tablet, Take 1 tablet by mouth once daily, Disp: 90 tablet, Rfl: 3 .  aspirin EC 81 MG tablet, Take 81 mg by mouth daily., Disp: , Rfl:  .  atorvastatin (LIPITOR) 40 MG tablet, Take 1 tablet by mouth once daily, Disp: 90 tablet, Rfl: 3 .  canagliflozin (INVOKANA) 100 MG TABS tablet, Take 1 tablet (100 mg total) by mouth daily. (Patient not taking: Reported on 11/03/2019), Disp: 90 tablet, Rfl: 3 .  ibuprofen (ADVIL) 800 MG tablet, Take 1 tablet (800 mg total) by mouth every 6 (six) hours as needed., Disp: 60 tablet, Rfl: 1 .  metFORMIN (GLUCOPHAGE-XR) 750 MG 24 hr tablet, Take 1 tablet (750 mg total) by mouth in the morning and at bedtime., Disp: 180 tablet, Rfl: 1  Social History   Tobacco Use  Smoking Status Former Smoker  . Quit date: 03/02/1995  . Years since quitting: 24.7  Smokeless Tobacco Never Used    Allergies  Allergen Reactions  . Lisinopril Swelling   Objective:   There were no vitals filed for this visit. There is no height or weight on file to calculate BMI. Constitutional Well  developed. Well nourished.  Vascular Foot warm and well perfused. Capillary refill normal to all digits.   Neurologic Normal speech. Oriented to person, place, and time. Epicritic sensation to light touch grossly present bilaterally.  Dermatologic  skin completely reepithelialized.  No signs of infection noted.  No pain on palpation noted.  Good range of motion and strength noted to the left foot.  Manual muscle testing is 4 out of 5 to the left side.  The peroneal and seems to be functioning in the strength appears to be maintained.  Orthopedic:  No tenderness to palpation noted about the surgical site.   Radiographs: None Assessment:   1. Peroneal tendinitis of left lower extremity   2. Status post foot surgery    Plan:  Patient was evaluated and treated and all questions answered.  S/p foot surgery left -Progressing as expected post-operatively. -XR: None -WB Status: Transition to weightbearing as tolerated in a cam boot to the left lower extremity -Sutures: None -Medications: None -Continue physical therapy and transition from boot into regular sneakers with Tri-Lock ankle brace.  However Tri-Lock ankle brace was dispensed.  No follow-ups on file.

## 2019-12-14 ENCOUNTER — Other Ambulatory Visit: Payer: Self-pay | Admitting: Family Medicine

## 2020-01-12 ENCOUNTER — Other Ambulatory Visit: Payer: Self-pay

## 2020-01-12 ENCOUNTER — Ambulatory Visit (INDEPENDENT_AMBULATORY_CARE_PROVIDER_SITE_OTHER): Payer: 59 | Admitting: Podiatry

## 2020-01-12 ENCOUNTER — Ambulatory Visit (INDEPENDENT_AMBULATORY_CARE_PROVIDER_SITE_OTHER): Payer: 59

## 2020-01-12 DIAGNOSIS — M778 Other enthesopathies, not elsewhere classified: Secondary | ICD-10-CM | POA: Diagnosis not present

## 2020-01-12 DIAGNOSIS — M7672 Peroneal tendinitis, left leg: Secondary | ICD-10-CM

## 2020-01-12 MED ORDER — TRIAMCINOLONE ACETONIDE 10 MG/ML IJ SUSP
10.0000 mg | Freq: Once | INTRAMUSCULAR | Status: AC
  Administered 2020-01-12: 10 mg via INTRAMUSCULAR

## 2020-01-13 ENCOUNTER — Encounter: Payer: Self-pay | Admitting: Podiatry

## 2020-01-13 DIAGNOSIS — M778 Other enthesopathies, not elsewhere classified: Secondary | ICD-10-CM | POA: Diagnosis not present

## 2020-01-13 MED ORDER — TRIAMCINOLONE ACETONIDE 10 MG/ML IJ SUSP
10.0000 mg | Freq: Once | INTRAMUSCULAR | Status: AC
  Administered 2020-01-13: 10 mg via INTRA_ARTICULAR

## 2020-01-13 NOTE — Progress Notes (Signed)
Subjective:  Patient ID: Colleen Ford, female    DOB: 1965-03-06,  MRN: [id number]  Chief Complaint  Patient presents with  . Routine Post Op    54 y.o. female presents with the above complaint.  Patient presents with complaint of left lateral foot pain along the course of peroneal tendon.  Patient states that she had surgery done back in August.  She was improving however she still is noticing some residual pain.  She would like to know if there is anything that can be done.  She is open to an injection as well.  She does not have any orthotics.  She denies any other acute complaints   Review of Systems: Negative except as noted in the HPI. Denies N/V/F/Ch.  Past Medical History:  Diagnosis Date  . Anemia   . Arthritis    both knees  . Depression   . Diabetes mellitus without complication ([place])   . GERD (gastroesophageal reflux disease)   . Hyperlipidemia   . Hypertension     Current Outpatient Medications:  .  amLODipine (NORVASC) 10 MG tablet, Take 1 tablet by mouth once daily, Disp: 90 tablet, Rfl: 3 .  aspirin EC 81 MG tablet, Take 81 mg by mouth daily., Disp: , Rfl:  .  atorvastatin (LIPITOR) 40 MG tablet, Take 1 tablet by mouth once daily, Disp: 90 tablet, Rfl: 3 .  canagliflozin (INVOKANA) 100 MG TABS tablet, Take 1 tablet (100 mg total) by mouth daily. (Patient not taking: Reported on 11/03/2019), Disp: 90 tablet, Rfl: 3 .  ibuprofen (ADVIL) 800 MG tablet, Take 1 tablet (800 mg total) by mouth every 6 (six) hours as needed., Disp: 60 tablet, Rfl: 1 .  metFORMIN (GLUCOPHAGE-XR) 750 MG 24 hr tablet, Take 1 tablet (750 mg total) by mouth in the morning and at bedtime., Disp: 180 tablet, Rfl: 0  Social History   Tobacco Use  Smoking Status Former Smoker  . Quit date: 03/02/1995  . Years since quitting: 24.8  Smokeless Tobacco Never Used    Allergies  Allergen Reactions  . Lisinopril Swelling   Objective:  There were no vitals filed for this visit. There is no  height or weight on file to calculate BMI. Constitutional Well developed. Well nourished.  Vascular Dorsalis pedis pulses palpable bilaterally. Posterior tibial pulses palpable bilaterally. Capillary refill normal to all digits.  No cyanosis or clubbing noted. Pedal hair growth normal.  Neurologic Normal speech. Oriented to person, place, and time. Epicritic sensation to light touch grossly present bilaterally.  Dermatologic Nails well groomed and normal in appearance. No open wounds. No skin lesions.  Orthopedic:  Pain on palpation to the left peroneal tendon at the insertion into the fifth metatarsal base.  No pain along the course of the tendon..  She may also have superimposed pain at the tarsometatarsal joint of the fourth.  Mild pain with resisted dorsiflexion eversion of the foot.  No pain with plantarflexion inversion of the foot.   Radiographs: 3 views of skeletally mature adult left foot no osseous abnormality noted.  No fractures noted.  No stress fracture noted.  Pes planovalgus foot structure noted.  Plantar heel spurring  Assessment:   1. Peroneal tendinitis of left lower extremity    Plan:  Patient was evaluated and treated and all questions answered.  Left peroneal tendinitis with underlying capsulitis -I explained to the patient the etiology of tendinitis emergency room and options were discussed.  Given that patient may be having some residual pain either  at the tarsometatarsal joint versus the peroneal tendon I believe she will benefit from a steroid injection.  I discussed with the patient that given that this is near the vicinity of a tendon there is a risk of rupture associated with it.  Patient states understanding to proceed with the risks. -Also discussed gust with her the importance of orthotics to take the stress off of the peroneal tendon.  I instructed her to call her insurance to see if the insurance is covered. -A steroid injection was performed at left  lateral foot at the point of maximal tenderness using 1% plain Lidocaine and 10 mg of Kenalog. This was well tolerated.  No follow-ups on file.

## 2020-01-18 ENCOUNTER — Ambulatory Visit (INDEPENDENT_AMBULATORY_CARE_PROVIDER_SITE_OTHER): Payer: 59 | Admitting: Family Medicine

## 2020-01-18 ENCOUNTER — Other Ambulatory Visit: Payer: Self-pay

## 2020-01-18 ENCOUNTER — Encounter: Payer: Self-pay | Admitting: Family Medicine

## 2020-01-18 VITALS — BP 126/80 | HR 80 | Wt 180.0 lb

## 2020-01-18 DIAGNOSIS — N950 Postmenopausal bleeding: Secondary | ICD-10-CM

## 2020-01-18 DIAGNOSIS — D649 Anemia, unspecified: Secondary | ICD-10-CM | POA: Diagnosis not present

## 2020-01-18 DIAGNOSIS — E7800 Pure hypercholesterolemia, unspecified: Secondary | ICD-10-CM

## 2020-01-18 DIAGNOSIS — E119 Type 2 diabetes mellitus without complications: Secondary | ICD-10-CM

## 2020-01-18 DIAGNOSIS — Z1159 Encounter for screening for other viral diseases: Secondary | ICD-10-CM

## 2020-01-18 DIAGNOSIS — I10 Essential (primary) hypertension: Secondary | ICD-10-CM | POA: Diagnosis not present

## 2020-01-18 LAB — POCT GLYCOSYLATED HEMOGLOBIN (HGB A1C): Hemoglobin A1C: 7.6 % — AB (ref 4.0–5.6)

## 2020-01-18 NOTE — Assessment & Plan Note (Signed)
Encouraged to take lipitor daily Will recheck profile next visit

## 2020-01-18 NOTE — Patient Instructions (Addendum)
Good to see you today!  Thanks for coming in.  Your A1c is doing much better   You need to get  Mammogram  Eye check - consider [facility]   I will call you if your tests are not good.  Otherwise, I will send you a message on MyChart (if it is active) or a letter in the mail..  If you do not hear from me with in 2 weeks please call our office.   Come back in 3 months       .

## 2020-01-18 NOTE — Assessment & Plan Note (Signed)
Improving.  Continue current medications and weight control

## 2020-01-18 NOTE — Progress Notes (Signed)
    SUBJECTIVE:   CHIEF COMPLAINT / HPI:   DIABETES Blood sugar ranging in the mid 100s.  None low. Taking all diabetes as prescribed  CHOLESTEROL Was off the lipitor now back on. Trying to take every day  WEIGHT Has lost a few more pounds despite foot surgery.  Working on her diet   PERTINENT  PMH / PSH: Has 2 sons who live with her in low to mid 44s  OBJECTIVE:   BP 126/80   Pulse 80   Wt 180 lb (81.6 kg)   LMP 12/31/2013   SpO2 99%   BMI 31.53 kg/m   Heart - Regular rate and rhythm.  No murmurs, gallops or rubs.    Lungs:  Normal respiratory effort, chest expands symmetrically. Lungs are clear to auscultation, no crackles or wheezes.  ASSESSMENT/PLAN:   Essential hypertension, benign BP Readings from Last 3 Encounters:  01/18/20 126/80  11/03/19 124/72  07/06/19 128/80   Stable continue current medications   Diabetes mellitus type 2 without retinopathy Improving.  Continue current medications and weight control   Anemia Not currently taking iron. Will check cbc and ferritin.    Hyperlipidemia Encouraged to take lipitor daily Will recheck profile next visit   Post-menopausal bleeding No more bleeding      [name], MD [place]

## 2020-01-18 NOTE — Assessment & Plan Note (Signed)
BP Readings from Last 3 Encounters:  01/18/20 126/80  11/03/19 124/72  07/06/19 128/80   Stable continue current medications

## 2020-01-18 NOTE — Assessment & Plan Note (Signed)
Not currently taking iron. Will check cbc and ferritin.

## 2020-01-18 NOTE — Assessment & Plan Note (Signed)
No more bleeding

## 2020-01-19 LAB — CBC
Hematocrit: 39.3 % (ref 34.0–46.6)
Hemoglobin: 12.5 g/dL (ref 11.1–15.9)
MCH: 24.5 pg — ABNORMAL LOW (ref 26.6–33.0)
MCHC: 31.8 g/dL (ref 31.5–35.7)
MCV: 77 fL — ABNORMAL LOW (ref 79–97)
Platelets: 191 10*3/uL (ref 150–450)
RBC: 5.1 x10E6/uL (ref 3.77–5.28)
RDW: 14.1 % (ref 11.7–15.4)
WBC: 6.5 10*3/uL (ref 3.4–10.8)

## 2020-01-19 LAB — FERRITIN: Ferritin: 17 ng/mL (ref 15–150)

## 2020-01-19 LAB — HEPATITIS C ANTIBODY: Hep C Virus Ab: <0.1 s/co ratio (ref 0.0–0.9)

## 2020-01-24 ENCOUNTER — Other Ambulatory Visit: Payer: 59 | Admitting: Orthotics

## 2020-01-24 ENCOUNTER — Other Ambulatory Visit: Payer: Self-pay

## 2020-01-24 DIAGNOSIS — Q666 Other congenital valgus deformities of feet: Secondary | ICD-10-CM

## 2020-01-24 DIAGNOSIS — M7672 Peroneal tendinitis, left leg: Secondary | ICD-10-CM

## 2020-02-11 ENCOUNTER — Other Ambulatory Visit: Payer: Self-pay

## 2020-02-11 ENCOUNTER — Ambulatory Visit (INDEPENDENT_AMBULATORY_CARE_PROVIDER_SITE_OTHER): Payer: 59 | Admitting: Podiatry

## 2020-02-11 DIAGNOSIS — M7672 Peroneal tendinitis, left leg: Secondary | ICD-10-CM

## 2020-02-11 DIAGNOSIS — Q666 Other congenital valgus deformities of feet: Secondary | ICD-10-CM | POA: Diagnosis not present

## 2020-02-16 ENCOUNTER — Encounter: Payer: Self-pay | Admitting: Podiatry

## 2020-02-16 NOTE — Progress Notes (Signed)
Subjective:  Patient ID: Colleen Ford, female    DOB: Jun 29, 1965,  MRN: [id number]  Chief Complaint  Patient presents with  . Routine Post Op    Pt stated that she is doing well she stated that she can still feel some pain where the scar is it feels like there is a needle that is constantly poking her.     55 y.o. female presents with the above complaint.  Patient presents with a follow-up of left lateral foot pain consistent with peroneal tendinitis.  Patient states she is doing a little bit better.  She still has numbness and tingling from scarring with thickening of the scar.  She denies any other acute complaints she states that the orthotics are covered when she called the insurance company.   Review of Systems: Negative except as noted in the HPI. Denies N/V/F/Ch.  Past Medical History:  Diagnosis Date  . Anemia   . Arthritis    both knees  . Depression   . Diabetes mellitus without complication ([place])   . GERD (gastroesophageal reflux disease)   . Hyperlipidemia   . Hypertension     Current Outpatient Medications:  .  amLODipine (NORVASC) 10 MG tablet, Take 1 tablet by mouth once daily, Disp: 90 tablet, Rfl: 3 .  atorvastatin (LIPITOR) 40 MG tablet, Take 1 tablet by mouth once daily (Patient not taking: Reported on 01/18/2020), Disp: 90 tablet, Rfl: 3 .  canagliflozin (INVOKANA) 100 MG TABS tablet, Take 1 tablet (100 mg total) by mouth daily., Disp: 90 tablet, Rfl: 3 .  ferrous sulfate 325 (65 FE) MG tablet, Take 325 mg by mouth daily with breakfast., Disp: , Rfl:  .  metFORMIN (GLUCOPHAGE-XR) 750 MG 24 hr tablet, Take 1 tablet (750 mg total) by mouth in the morning and at bedtime., Disp: 180 tablet, Rfl: 0  Social History   Tobacco Use  Smoking Status Former Smoker  . Quit date: 03/02/1995  . Years since quitting: 24.9  Smokeless Tobacco Never Used    Allergies  Allergen Reactions  . Lisinopril Swelling   Objective:  There were no vitals filed for this  visit. There is no height or weight on file to calculate BMI. Constitutional Well developed. Well nourished.  Vascular Dorsalis pedis pulses palpable bilaterally. Posterior tibial pulses palpable bilaterally. Capillary refill normal to all digits.  No cyanosis or clubbing noted. Pedal hair growth normal.  Neurologic Normal speech. Oriented to person, place, and time. Epicritic sensation to light touch grossly present bilaterally.  Dermatologic Nails well groomed and normal in appearance. No open wounds. No skin lesions.  Orthopedic:  Mild pain on palpation to the left peroneal tendon at the insertion into the fifth metatarsal base.  No pain along the course of the tendon..  She may also have superimposed pain at the tarsometatarsal joint of the fourth.  Mild pain with resisted dorsiflexion eversion of the foot.  No pain with plantarflexion inversion of the foot.   Radiographs: 3 views of skeletally mature adult left foot no osseous abnormality noted.  No fractures noted.  No stress fracture noted.  Pes planovalgus foot structure noted.  Plantar heel spurring  Assessment:   1. Peroneal tendinitis of left lower extremity    Plan:  Patient was evaluated and treated and all questions answered.  Left peroneal tendinitis with underlying capsulitis -Clinically resolving with a steroid injection.  I discussed with her the final management of this would be orthotics.  Pes planovalgus -I spent to the patient the  etiology of pes planovalgus and various treatment options were discussed.  Given the amount of pain that she is having with continuous pain to the lateral side of the foot I believe patient will benefit from custom-made orthotics.  Patient had discussed with the insurance company that they will be covered. -I will have her follow-up with Liliane Channel for custom-made orthotics  No follow-ups on file.

## 2020-03-03 ENCOUNTER — Other Ambulatory Visit: Payer: Self-pay

## 2020-03-03 ENCOUNTER — Encounter: Payer: 59 | Admitting: Orthotics

## 2020-04-07 ENCOUNTER — Ambulatory Visit (INDEPENDENT_AMBULATORY_CARE_PROVIDER_SITE_OTHER): Payer: 59 | Admitting: Podiatry

## 2020-04-07 ENCOUNTER — Other Ambulatory Visit: Payer: Self-pay

## 2020-04-07 DIAGNOSIS — M7672 Peroneal tendinitis, left leg: Secondary | ICD-10-CM | POA: Diagnosis not present

## 2020-04-11 ENCOUNTER — Encounter: Payer: Self-pay | Admitting: Podiatry

## 2020-04-11 NOTE — Progress Notes (Signed)
Subjective:  Patient ID: Colleen Ford, female    DOB: 1965-04-15,  MRN: [id number]  Chief Complaint  Patient presents with  . Foot Pain    [name]     55 y.o. female presents with the above complaint.  Patient presents with follow-up of left lateral foot pain with peroneal tendinitis.  Patient states she is doing a lot better.  She only has a little bit of pain.  She has been using her ankle brace as well.  She has also been wearing her orthotics as well.  She denies any other acute complaints.  Review of Systems: Negative except as noted in the HPI. Denies N/V/F/Ch.  Past Medical History:  Diagnosis Date  . Anemia   . Arthritis    both knees  . Depression   . Diabetes mellitus without complication ([place])   . GERD (gastroesophageal reflux disease)   . Hyperlipidemia   . Hypertension     Current Outpatient Medications:  .  amLODipine (NORVASC) 10 MG tablet, Take 1 tablet by mouth once daily, Disp: 90 tablet, Rfl: 3 .  atorvastatin (LIPITOR) 40 MG tablet, Take 1 tablet by mouth once daily (Patient not taking: Reported on 01/18/2020), Disp: 90 tablet, Rfl: 3 .  canagliflozin (INVOKANA) 100 MG TABS tablet, Take 1 tablet (100 mg total) by mouth daily., Disp: 90 tablet, Rfl: 3 .  ferrous sulfate 325 (65 FE) MG tablet, Take 325 mg by mouth daily with breakfast., Disp: , Rfl:  .  metFORMIN (GLUCOPHAGE-XR) 750 MG 24 hr tablet, Take 1 tablet (750 mg total) by mouth in the morning and at bedtime., Disp: 180 tablet, Rfl: 0  Social History   Tobacco Use  Smoking Status Former Smoker  . Quit date: 03/02/1995  . Years since quitting: 25.1  Smokeless Tobacco Never Used    Allergies  Allergen Reactions  . Lisinopril Swelling   Objective:  There were no vitals filed for this visit. There is no height or weight on file to calculate BMI. Constitutional Well developed. Well nourished.  Vascular Dorsalis pedis pulses palpable  bilaterally. Posterior tibial pulses palpable bilaterally. Capillary refill normal to all digits.  No cyanosis or clubbing noted. Pedal hair growth normal.  Neurologic Normal speech. Oriented to person, place, and time. Epicritic sensation to light touch grossly present bilaterally.  Dermatologic Nails well groomed and normal in appearance. No open wounds. No skin lesions.  Orthopedic:  Mild pain on palpation to the left peroneal tendon at the insertion into the fifth metatarsal base.  No pain along the course of the tendon..  She may also have superimposed pain at the tarsometatarsal joint of the fourth.  Mild pain with resisted dorsiflexion eversion of the foot.  No pain with plantarflexion inversion of the foot.   Radiographs: 3 views of skeletally mature adult left foot no osseous abnormality noted.  No fractures noted.  No stress fracture noted.  Pes planovalgus foot structure noted.  Plantar heel spurring  Assessment:   1. Peroneal tendinitis of left lower extremity    Plan:  Patient was evaluated and treated and all questions answered.  Left peroneal tendinitis with underlying capsulitis -Improved clinically.  We will hold off on any further steroid injection.  She can continue using Tri-Lock ankle brace as needed for stability.  If any foot and ankle issues arise to come back and see me.  Pes planovalgus -I spent to the patient the etiology of pes planovalgus and various treatment  options were discussed.  Given the amount of pain that she is having with continuous pain to the lateral side of the foot I believe patient will benefit from custom-made orthotics.  Patient had discussed with the insurance company that they will be covered. -Continue wearing orthotics.  Orthotics are functioning fine.  No follow-ups on file.

## 2020-05-12 ENCOUNTER — Other Ambulatory Visit: Payer: Self-pay | Admitting: Family Medicine

## 2020-05-15 ENCOUNTER — Telehealth: Payer: Self-pay

## 2020-05-15 NOTE — Telephone Encounter (Signed)
Received fax from pharmacy, PA needed on [place].  Clinical questions submitted via Cover My Meds.  Waiting on response, could take up to 72 hours.  Cover My Meds info: Key: [id number]  Talbot Grumbling, RN

## 2020-05-16 NOTE — Telephone Encounter (Signed)
Please see below for determination from PA.     Fax will be coming through with next steps, however, it appears that insurance requirements state that patient would need to trial and fail 2 of the preferred drugs (Farxiga and [place]).   Please advise.   Talbot Grumbling, RN

## 2020-05-16 NOTE — Telephone Encounter (Signed)
Fax received from insurance. Patient needs to try and fail [place] or [place].

## 2020-05-24 ENCOUNTER — Encounter: Payer: Self-pay | Admitting: Family Medicine

## 2020-05-24 MED ORDER — EMPAGLIFLOZIN 10 MG PO TABS: 10.0000 mg | ORAL_TABLET | Freq: Every day | ORAL | 0 refills | Status: DC

## 2020-05-24 NOTE — Telephone Encounter (Signed)
Rx for jardiance to replace invovkana written.  Will my chart patient

## 2020-05-24 NOTE — Addendum Note (Signed)
Addended by: Talbert Cage L on: 05/24/2020 02:52 PM   Modules accepted: Orders

## 2020-05-26 ENCOUNTER — Telehealth: Payer: Self-pay | Admitting: *Deleted

## 2020-05-26 ENCOUNTER — Encounter: Payer: Self-pay | Admitting: Family Medicine

## 2020-05-26 MED ORDER — DAPAGLIFLOZIN PROPANEDIOL 5 MG PO TABS: 5.0000 mg | ORAL_TABLET | Freq: Every day | ORAL | 2 refills | Status: DC

## 2020-05-26 NOTE — Telephone Encounter (Signed)
Called patient to inform of change.  Noticed that we received a PA for [place].  Submitted info, see next phone note.  Pt informed we will be in touch with her next week.   Christen Bame, CMA

## 2020-05-26 NOTE — Addendum Note (Signed)
Addended by: Talbert Cage L on: 05/26/2020 02:34 PM   Modules accepted: Orders

## 2020-05-26 NOTE — Telephone Encounter (Signed)
Rx sent for dapagliflozin 5 mg daily instead of [place]

## 2020-05-26 NOTE — Telephone Encounter (Signed)
Received fax from pharmacy, PA needed on Farxiga.  Clinical questions submitted via Cover My Meds.  Waiting on response, could take up to 72 hours.  Cover My Meds info: Key: [id number]  Christen Bame, CMA

## 2020-05-26 NOTE — Telephone Encounter (Signed)
Received fax from pharmacy stating that Jardiance 10 mg needs a PA. Please see the below list of medications that the insurance prefers. The following message was included in the PA request.  *Non-preferred medications may have a higher patient co-pay than the health insurance plan's preferred medications.      Please advise if you would like for a PA to be initiated or if you would like to send in alternative. Please route back to RN team for next steps.   Talbot Grumbling, RN

## 2020-06-05 ENCOUNTER — Encounter: Payer: Self-pay | Admitting: Family Medicine

## 2020-06-15 ENCOUNTER — Encounter: Payer: Self-pay | Admitting: Family Medicine

## 2020-06-15 NOTE — Telephone Encounter (Signed)
Sent my chart message to see if any medication was every approved

## 2020-06-28 ENCOUNTER — Ambulatory Visit (INDEPENDENT_AMBULATORY_CARE_PROVIDER_SITE_OTHER): Payer: Self-pay

## 2020-06-28 ENCOUNTER — Ambulatory Visit (INDEPENDENT_AMBULATORY_CARE_PROVIDER_SITE_OTHER): Payer: Self-pay | Admitting: Family Medicine

## 2020-06-28 ENCOUNTER — Encounter: Payer: Self-pay | Admitting: Family Medicine

## 2020-06-28 ENCOUNTER — Other Ambulatory Visit: Payer: Self-pay

## 2020-06-28 VITALS — BP 123/70 | HR 73 | Ht 63.0 in | Wt 168.8 lb

## 2020-06-28 DIAGNOSIS — E7800 Pure hypercholesterolemia, unspecified: Secondary | ICD-10-CM

## 2020-06-28 DIAGNOSIS — E119 Type 2 diabetes mellitus without complications: Secondary | ICD-10-CM

## 2020-06-28 DIAGNOSIS — Z23 Encounter for immunization: Secondary | ICD-10-CM

## 2020-06-28 DIAGNOSIS — D508 Other iron deficiency anemias: Secondary | ICD-10-CM

## 2020-06-28 DIAGNOSIS — I10 Essential (primary) hypertension: Secondary | ICD-10-CM

## 2020-06-28 LAB — POCT GLYCOSYLATED HEMOGLOBIN (HGB A1C): HbA1c, POC (controlled diabetic range): 7.7 % — AB (ref 0.0–7.0)

## 2020-06-28 MED ORDER — ATORVASTATIN CALCIUM 40 MG PO TABS: 1.0000 | ORAL_TABLET | Freq: Every day | ORAL | 3 refills | Status: DC

## 2020-06-28 MED ORDER — METFORMIN HCL ER 750 MG PO TB24: 750.0000 mg | ORAL_TABLET | Freq: Two times a day (BID) | ORAL | 3 refills | Status: DC

## 2020-06-28 MED ORDER — AMLODIPINE BESYLATE 5 MG PO TABS: 5.0000 mg | ORAL_TABLET | Freq: Every day | ORAL | 3 refills | Status: DC

## 2020-06-28 NOTE — Assessment & Plan Note (Signed)
Improved A1c despite not being able to get flozin from her insurance.  Likely due to her improvement in weight.  Continue diet control and metformin

## 2020-06-28 NOTE — Patient Instructions (Signed)
Good to see you today - Thank you for coming in  Things we discussed today:  You need a mammogram to prevent breast cancer.  Please schedule an appointment.  You can call [phone].    You need an diabetes eye exam every year.  Please see your eye doctor.  Ask them to fax [place] a report of your exam  I will call you if your tests are not good.  Otherwise, I will send you a message on MyChart (if it is active) or a letter in the mail..  If you do not hear from me with in 2 weeks please call our office.      Please always bring your medication bottles  Come back to see me in 3 months

## 2020-06-28 NOTE — Assessment & Plan Note (Signed)
BP Readings from Last 3 Encounters:  06/28/20 123/70  01/18/20 126/80  11/03/19 124/72   At goal despite not taking amlodipine recently.  Will decrease dose and monitor

## 2020-06-28 NOTE — Progress Notes (Signed)
    SUBJECTIVE:   CHIEF COMPLAINT / HPI:   DIABETES Taking metformin twice a day.  Insurance denied payment for multiple flozins  Has been losing weight by changing her diet   ANEMIA No GI or Gyn bleeding.  Not taking iron.  Recent MCV low and Ferritin of 17.    CHOLESTEROL Taking lipitor every day   HYPERTENSION Has been out of amlodipine about at week.  No lightheadness  PERTINENT  PMH / PSH: sp recent foot surgery.  Always loses weight after surgery  OBJECTIVE:   BP 123/70   Pulse 73   Ht 5\' 3"  (1.6 m)   Wt 168 lb 12.8 oz (76.6 kg)   LMP 12/31/2013   SpO2 100%   BMI 29.90 kg/m   Psych:  Cognition and judgment appear intact. Alert, communicative  and cooperative with normal attention span and concentration. No apparent delusions, illusions, hallucinations   ASSESSMENT/PLAN:   Essential hypertension, benign BP Readings from Last 3 Encounters:  06/28/20 123/70  01/18/20 126/80  11/03/19 124/72   At goal despite not taking amlodipine recently.  Will decrease dose and monitor   Diabetes mellitus type 2 without retinopathy Improved A1c despite not being able to get flozin from her insurance.  Likely due to her improvement in weight.  Continue diet control and metformin   Anemia Asymptomatic.  Check cbc today      [name], MD [place]

## 2020-06-28 NOTE — Assessment & Plan Note (Signed)
Asymptomatic.  Check cbc today

## 2020-06-29 LAB — CMP14+EGFR
ALT: 11 IU/L (ref 0–32)
AST: 14 IU/L (ref 0–40)
Albumin/Globulin Ratio: 1.6 (ref 1.2–2.2)
Albumin: 4.7 g/dL (ref 3.8–4.9)
Alkaline Phosphatase: 97 IU/L (ref 44–121)
BUN/Creatinine Ratio: 17 (ref 9–23)
BUN: 11 mg/dL (ref 6–24)
Bilirubin Total: 0.5 mg/dL (ref 0.0–1.2)
CO2: 23 mmol/L (ref 20–29)
Calcium: 9.4 mg/dL (ref 8.7–10.2)
Chloride: 103 mmol/L (ref 96–106)
Creatinine, Ser: 0.66 mg/dL (ref 0.57–1.00)
Globulin, Total: 3 g/dL (ref 1.5–4.5)
Glucose: 134 mg/dL — ABNORMAL HIGH (ref 65–99)
Potassium: 4 mmol/L (ref 3.5–5.2)
Sodium: 141 mmol/L (ref 134–144)
Total Protein: 7.7 g/dL (ref 6.0–8.5)
eGFR: 104 mL/min/{1.73_m2} (ref >59)

## 2020-06-29 LAB — CBC
Hematocrit: 41.9 % (ref 34.0–46.6)
Hemoglobin: 13.4 g/dL (ref 11.1–15.9)
MCH: 24.4 pg — ABNORMAL LOW (ref 26.6–33.0)
MCHC: 32 g/dL (ref 31.5–35.7)
MCV: 76 fL — ABNORMAL LOW (ref 79–97)
Platelets: 195 10*3/uL (ref 150–450)
RBC: 5.5 x10E6/uL — ABNORMAL HIGH (ref 3.77–5.28)
RDW: 15 % (ref 11.7–15.4)
WBC: 5.8 10*3/uL (ref 3.4–10.8)

## 2020-06-29 LAB — LIPID PANEL
Chol/HDL Ratio: 2.3 ratio (ref 0.0–4.4)
Cholesterol, Total: 206 mg/dL — ABNORMAL HIGH (ref 100–199)
HDL: 91 mg/dL (ref >39)
LDL Chol Calc (NIH): 101 mg/dL — ABNORMAL HIGH (ref 0–99)
Triglycerides: 78 mg/dL (ref 0–149)
VLDL Cholesterol Cal: 14 mg/dL (ref 5–40)

## 2020-07-17 ENCOUNTER — Telehealth: Payer: Self-pay | Admitting: Family Medicine

## 2020-07-17 ENCOUNTER — Encounter: Payer: Self-pay | Admitting: Family Medicine

## 2020-07-17 NOTE — Telephone Encounter (Signed)
Medical certification for Application of renewal of Disability Parking Placard form dropped off for at front desk for completion.  Verified that patient section of form has been completed.  Last DOS/WCC with PCP was 06/28/20.  Placed form in team folder to be completed by clinical staff.  Colleen Ford

## 2020-07-18 NOTE — Telephone Encounter (Signed)
Reviewed form and placed in PCP's box for completion.  .Tremont Gavitt R Noam Karaffa, CMA  

## 2020-07-19 ENCOUNTER — Encounter: Payer: Self-pay | Admitting: Family Medicine

## 2020-07-19 NOTE — Telephone Encounter (Signed)
Sent patient Mychart message.

## 2020-09-26 ENCOUNTER — Encounter: Payer: Self-pay | Admitting: Family Medicine

## 2020-11-02 NOTE — Telephone Encounter (Signed)
error 

## 2021-01-09 ENCOUNTER — Telehealth: Payer: Self-pay | Admitting: Podiatry

## 2021-01-09 NOTE — Telephone Encounter (Signed)
I have received paperwork for Colleen Ford for accomodation at work. She is scheduled to see you 01/26/2022, would like for me to complete paperwork after her visit? Her last visit was 04/2020

## 2021-01-11 ENCOUNTER — Telehealth: Payer: Self-pay | Admitting: *Deleted

## 2021-01-11 NOTE — Telephone Encounter (Signed)
Patient is calling to reschedule her upcoming appointment(01/26/21), wants to wait until the beginning of the year.

## 2021-01-26 ENCOUNTER — Ambulatory Visit: Payer: Self-pay | Admitting: Podiatry

## 2021-02-21 ENCOUNTER — Ambulatory Visit: Payer: Self-pay | Admitting: Podiatry

## 2021-03-09 ENCOUNTER — Other Ambulatory Visit: Payer: Self-pay

## 2021-03-09 ENCOUNTER — Ambulatory Visit (INDEPENDENT_AMBULATORY_CARE_PROVIDER_SITE_OTHER): Payer: 59 | Admitting: Podiatry

## 2021-03-09 ENCOUNTER — Encounter: Payer: Self-pay | Admitting: Podiatry

## 2021-03-09 DIAGNOSIS — M7672 Peroneal tendinitis, left leg: Secondary | ICD-10-CM

## 2021-03-09 NOTE — Progress Notes (Signed)
Subjective:  Patient ID: Colleen Ford, female    DOB: 11-Dec-1965,  MRN: [id number]  Chief Complaint  Patient presents with   Foot Pain    Left foot     56 y.o. female presents with the above complaint.  Patient presents with follow-up of left lateral foot pain with peroneal tendinitis.  She states she is doing much better occasional pain here and there but she would like to get a note to return back to work without any restrictions.  Review of Systems: Negative except as noted in the HPI. Denies N/V/F/Ch.  Past Medical History:  Diagnosis Date   Anemia    Arthritis    both knees   Depression    Diabetes mellitus without complication ([facility])    GERD (gastroesophageal reflux disease)    Hyperlipidemia    Hypertension     Current Outpatient Medications:    amLODipine (NORVASC) 5 MG tablet, Take 1 tablet (5 mg total) by mouth at bedtime., Disp: 90 tablet, Rfl: 3   atorvastatin (LIPITOR) 40 MG tablet, Take 1 tablet (40 mg total) by mouth daily., Disp: 90 tablet, Rfl: 3   ferrous sulfate 325 (65 FE) MG tablet, Take 325 mg by mouth daily with breakfast. (Patient not taking: Reported on 06/28/2020), Disp: , Rfl:    metFORMIN (GLUCOPHAGE-XR) 750 MG 24 hr tablet, Take 1 tablet (750 mg total) by mouth in the morning and at bedtime., Disp: 180 tablet, Rfl: 3  Social History   Tobacco Use  Smoking Status Former   Types: Cigarettes   Quit date: 03/02/1995   Years since quitting: 26.0  Smokeless Tobacco Never    Allergies  Allergen Reactions   Lisinopril Swelling   Objective:  There were no vitals filed for this visit. There is no height or weight on file to calculate BMI. Constitutional Well developed. Well nourished.  Vascular Dorsalis pedis pulses palpable bilaterally. Posterior tibial pulses palpable bilaterally. Capillary refill normal to all digits.  No cyanosis or clubbing noted. Pedal hair growth normal.  Neurologic Normal speech. Oriented to person, place, and  time. Epicritic sensation to light touch grossly present bilaterally.  Dermatologic Nails well groomed and normal in appearance. No open wounds. No skin lesions.  Orthopedic: No further pain on palpation to the left peroneal tendon at the insertion into the fifth metatarsal base.  No pain along the course of the tendon..    No pain with resisted dorsiflexion eversion of the foot.  No pain with plantarflexion inversion of the foot.   Radiographs: 3 views of skeletally mature adult left foot no osseous abnormality noted.  No fractures noted.  No stress fracture noted.  Pes planovalgus foot structure noted.  Plantar heel spurring  Assessment:   No diagnosis found.  Plan:  Patient was evaluated and treated and all questions answered.  Left peroneal tendinitis with underlying capsulitis -Clinically resolved completely.  She can return to work with no restrictions.  At this time if any foot and ankle issues arise in the future I have asked her to come see me.  She can continue wearing orthotics I discussed some shoe gear modification with her.  Pes planovalgus -I spent to the patient the etiology of pes planovalgus and various treatment options were discussed.  Given the amount of pain that she is having with continuous pain to the lateral side of the foot I believe patient will benefit from custom-made orthotics.  Patient had discussed with the insurance company that they will be covered. -Continue wearing  orthotics.  Orthotics are functioning fine.  No follow-ups on file.

## 2021-03-14 ENCOUNTER — Other Ambulatory Visit: Payer: Self-pay

## 2021-03-14 ENCOUNTER — Ambulatory Visit (INDEPENDENT_AMBULATORY_CARE_PROVIDER_SITE_OTHER): Payer: Self-pay | Admitting: Family Medicine

## 2021-03-14 ENCOUNTER — Encounter: Payer: Self-pay | Admitting: Family Medicine

## 2021-03-14 VITALS — BP 143/83 | HR 111 | Wt 179.8 lb

## 2021-03-14 DIAGNOSIS — E7800 Pure hypercholesterolemia, unspecified: Secondary | ICD-10-CM

## 2021-03-14 DIAGNOSIS — I10 Essential (primary) hypertension: Secondary | ICD-10-CM

## 2021-03-14 DIAGNOSIS — E119 Type 2 diabetes mellitus without complications: Secondary | ICD-10-CM

## 2021-03-14 LAB — POCT GLYCOSYLATED HEMOGLOBIN (HGB A1C): HbA1c, POC (controlled diabetic range): 8 % — AB (ref 0.0–7.0)

## 2021-03-14 MED ORDER — AMLODIPINE BESYLATE 10 MG PO TABS: 10.0000 mg | ORAL_TABLET | Freq: Every day | ORAL | 1 refills | Status: DC

## 2021-03-14 MED ORDER — ATORVASTATIN CALCIUM 40 MG PO TABS: 40.0000 mg | ORAL_TABLET | Freq: Every day | ORAL | 3 refills | Status: DC

## 2021-03-14 NOTE — Progress Notes (Signed)
° ° °  SUBJECTIVE:   CHIEF COMPLAINT / HPI:   COUGH For last 3-4 days have cough with chills.  No specific fever or shortness of breath.  No leg swelling  DIABETES Has not been as good with her diet or exercise during the winter.  Gained 11 lbs Taking metformin fairly regularly  CHOLESTEROL Has been out of lipitor for quite a while.  Willing to start back  HYPERTENSION Taking amlodipine 5 mg daily.  Not checking her blood pressure   PERTINENT  PMH / PSH: works at [employer]:   BP (!) 143/83    Pulse (!) 111    Wt 179 lb 12.8 oz (81.6 kg)    LMP 12/31/2013    SpO2 97%    BMI 31.85 kg/m   Appears mildly fatigued. Infrequent cough Heart - Regular rate and rhythm.  No murmurs, gallops or rubs.    Lungs:  Normal respiratory effort, chest expands symmetrically. Lungs are clear to auscultation, no crackles or wheezes. Extremities:  No cyanosis, edema, or deformity noted with good range of motion of all major joints.     ASSESSMENT/PLAN:   Essential hypertension, benign Not at goal likely due to weight gain.  Go back to amlodipine 10 mg daily   Diabetes mellitus type 2 without retinopathy Worsening.  Discussed regular use of metformin and getting back to diet and exercise   Hyperlipidemia Refilled lipitor and encouraged to take daily   Bronchitis, viral - no signs of bacterial infection.  Suggest symptomatic otc medications   [name], MD [place]

## 2021-03-14 NOTE — Assessment & Plan Note (Signed)
Not at goal likely due to weight gain.  Go back to amlodipine 10 mg daily

## 2021-03-14 NOTE — Assessment & Plan Note (Signed)
Refilled lipitor and encouraged to take daily

## 2021-03-14 NOTE — Patient Instructions (Addendum)
Good to see you today - Thank you for coming in  Things we discussed today:  Diabetes - is getting to high.  - Make sure you take the metformin - 2 pills every day - Get back to your diet - Get back to your exercise   Blood pressure - go up to 10 mg of amlodipine every day   Cholesterol - take the lipitor every day.  I think you have a virus.  Use tylenol.  If worsening let me know  Let me know if you want an xray of your R thumb   You need an diabetes eye exam every year.  Please see your eye doctor.  Ask them to fax [place] a report of your exam   You need a mammogram to prevent breast cancer.  Please schedule an appointment.  You can call [phone].    Please always bring your medication bottles  Come back to see me in 3 months

## 2021-03-14 NOTE — Assessment & Plan Note (Signed)
Worsening.  Discussed regular use of metformin and getting back to diet and exercise

## 2021-04-03 ENCOUNTER — Encounter: Payer: Self-pay | Admitting: Family Medicine

## 2021-04-03 DIAGNOSIS — M79644 Pain in right finger(s): Secondary | ICD-10-CM

## 2021-04-04 ENCOUNTER — Encounter: Payer: Self-pay | Admitting: Family Medicine

## 2021-04-09 ENCOUNTER — Ambulatory Visit (HOSPITAL_COMMUNITY)
Admission: RE | Admit: 2021-04-09 | Discharge: 2021-04-09 | Disposition: A | Discharge status: Home / Self Care | Payer: 59 | Source: Ambulatory Visit | Attending: Family Medicine | Admitting: Family Medicine

## 2021-04-09 ENCOUNTER — Other Ambulatory Visit: Payer: Self-pay

## 2021-04-09 DIAGNOSIS — M79644 Pain in right finger(s): Secondary | ICD-10-CM | POA: Diagnosis not present

## 2021-04-09 DIAGNOSIS — M79641 Pain in right hand: Secondary | ICD-10-CM | POA: Diagnosis present

## 2021-04-09 IMAGING — DX DG FINGER THUMB 2+V*R*
3 series · 3 of 3 positions shown · non-contrast
Comparison: None.

CLINICAL DATA: Pain after injury several months ago.

EXAM:
RIGHT THUMB 2+V

[finger ap]
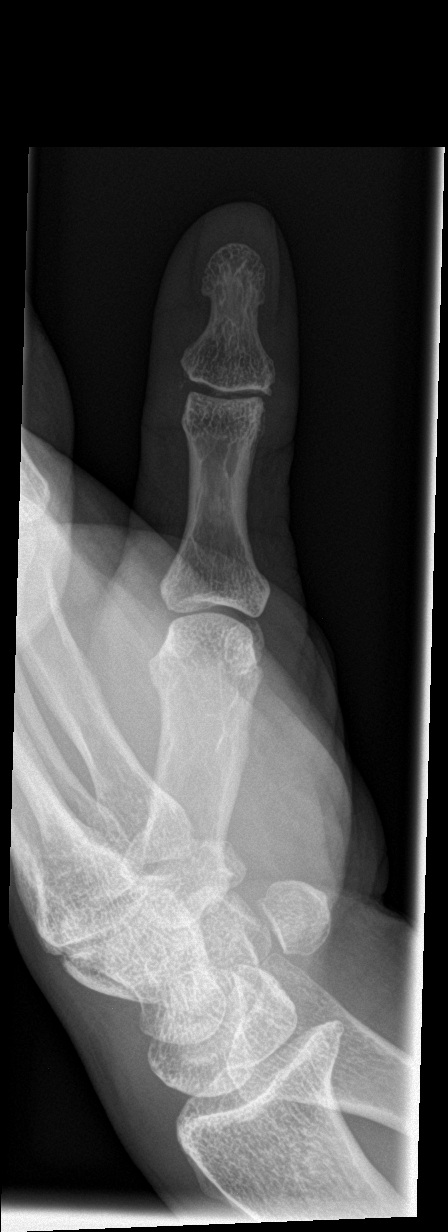

[finger obl]
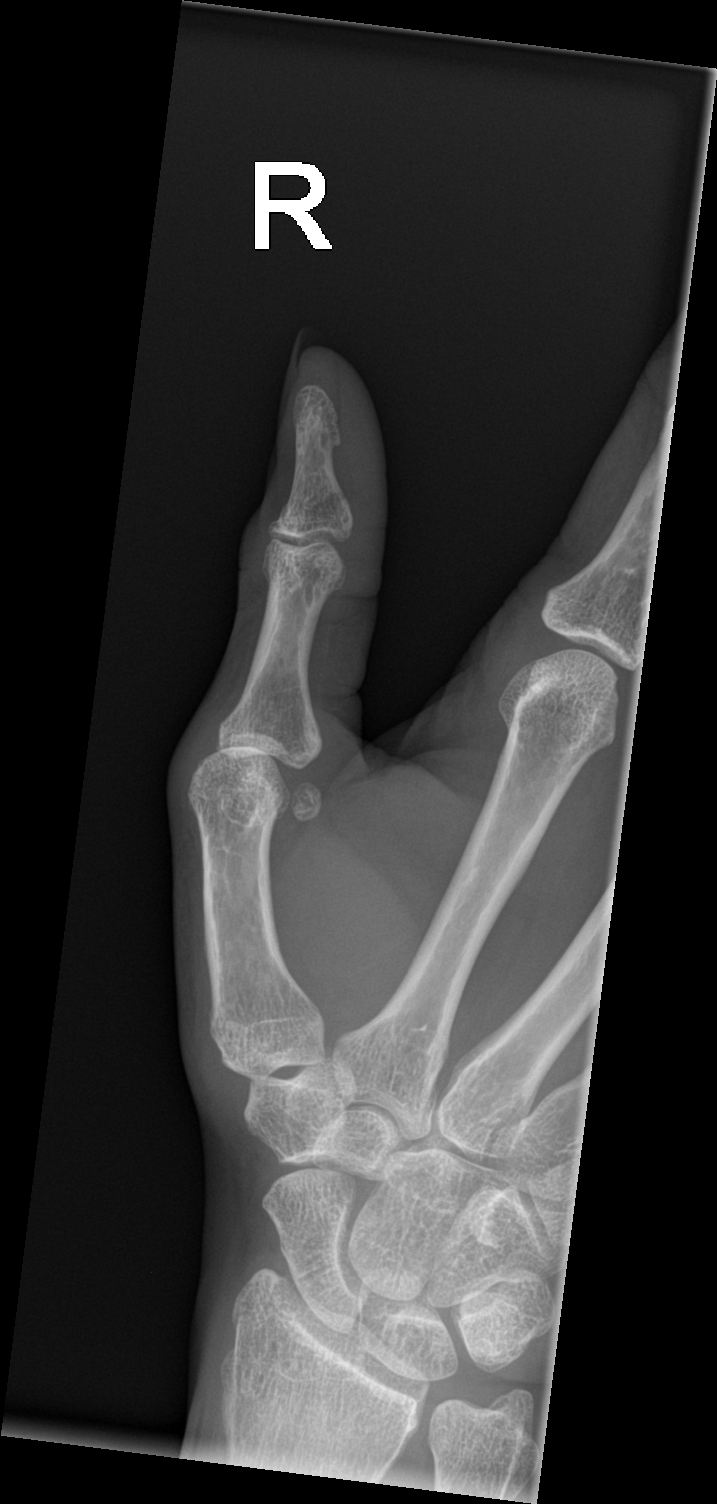

[finger lat]
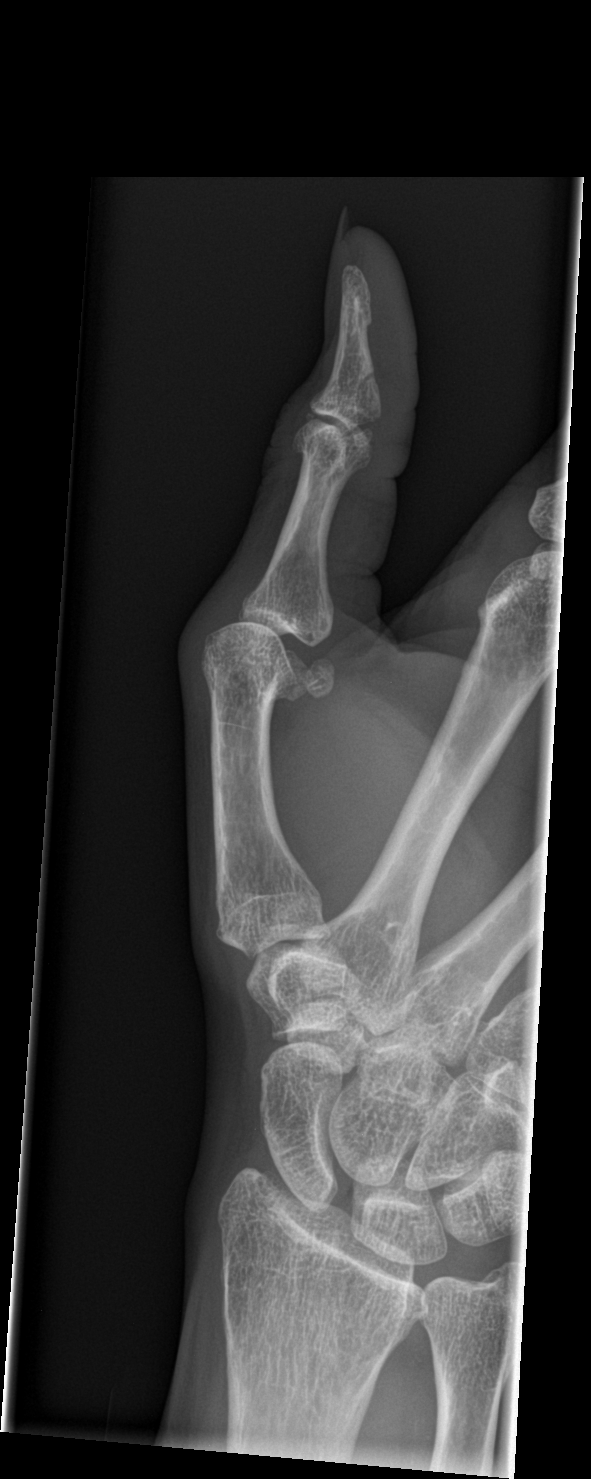

[3 of 3 positions shown; findings below may reference images not displayed]

FINDINGS: Lucency through the anterior base of the first distal phalanx
concerning for a nondisplaced fracture. Multiple tiny fracture
fragments are noted along the medial base of the first distal
phalanx at the IP joint.

No other fracture or dislocation. No aggressive osseous lesion. Mild
anterior subluxation of the first proximal phalanx relative to the
metacarpal head. Mild osteoarthritis of the first CMC joint. No
aggressive osseous lesion.

Soft tissue are unremarkable. No radiopaque foreign body or soft
tissue emphysema.
IMPRESSION: 1. Lucency through the anterior base of the first distal phalanx
concerning for a nondisplaced fracture. Multiple tiny fracture
fragments are noted along the medial base of the first distal
phalanx at the IP joint. If there is further clinical concern,
recommend a CT of the right hand.
2. Mild anterior subluxation of the first proximal phalanx relative
to the metacarpal head.

## 2021-04-10 ENCOUNTER — Encounter: Payer: Self-pay | Admitting: Family Medicine

## 2021-04-10 ENCOUNTER — Telehealth: Payer: Self-pay | Admitting: Family Medicine

## 2021-04-10 DIAGNOSIS — M79641 Pain in right hand: Secondary | ICD-10-CM

## 2021-04-10 DIAGNOSIS — M79644 Pain in right finger(s): Secondary | ICD-10-CM | POA: Insufficient documentation

## 2021-04-10 DIAGNOSIS — S6291XA Unspecified fracture of right wrist and hand, initial encounter for closed fracture: Secondary | ICD-10-CM | POA: Insufficient documentation

## 2021-04-10 HISTORY — DX: Pain in right finger(s): M79.644

## 2021-04-10 NOTE — Assessment & Plan Note (Signed)
Possible fracture on recent xray.  Refer to ortho  ?

## 2021-04-10 NOTE — Telephone Encounter (Signed)
Patient LVM on nurse line returning phone call to Dr. Erin Hearing.  ? ?I attempted to return call to patient. Patient did not answer. LVM for patient to return call.  ? ?Talbot Grumbling, RN ? ?

## 2021-04-10 NOTE — Telephone Encounter (Signed)
Left vm I called about her xray ?That I will send her a mychart message ?She can call if she wishes ?

## 2021-04-13 ENCOUNTER — Other Ambulatory Visit: Payer: Self-pay

## 2021-04-13 ENCOUNTER — Ambulatory Visit (INDEPENDENT_AMBULATORY_CARE_PROVIDER_SITE_OTHER): Payer: 59

## 2021-04-13 ENCOUNTER — Ambulatory Visit (INDEPENDENT_AMBULATORY_CARE_PROVIDER_SITE_OTHER): Payer: 59 | Admitting: Orthopedic Surgery

## 2021-04-13 DIAGNOSIS — M79641 Pain in right hand: Secondary | ICD-10-CM

## 2021-04-13 MED ORDER — MELOXICAM 7.5 MG PO TABS: 7.5000 mg | ORAL_TABLET | Freq: Every day | ORAL | 0 refills | Status: AC

## 2021-04-13 NOTE — Progress Notes (Signed)
? ?Office Visit Note ?  ?Patient: Colleen Ford           ?Date of Birth: 24-May-1965           ?MRN: [id number] ?Visit Date: 04/13/2021 ?             ?Requested by: [name], MD ?[address] ?[place],  [place] [address] ?PCP: [name], MD ? ? ?Assessment & Plan: ?Visit Diagnoses:  ?1. Hand pain, right   ? ? ?Plan: Discussed with patient that she has some findings concerning for thumb MP instability.  The proximal phalanx appears to be somewhat volarly subluxed relative to the metacarpal.  She does have increased laxity on the ulnar aspect of the thumb with radial stress but this is symmetric to the contralateral side.  We discussed further investigation with an MRI but she is not interested in pursuing that at this point. We discussed the potential need for surgical treatment if she has a collateral ligament injury.  She would like to try conservative treatment first before proceeding with advanced imaging.  We will plan on initial conservative management with activity modification, immobilization with a thumb spica brace, and oral anti-inflammatory medication.  I can see her back in another month if she is still symptomatic.  ? ?Follow-Up Instructions: No follow-ups on file.  ? ?Orders:  ?Orders Placed This Encounter  ?Procedures  ? XR Finger Thumb Right  ? ?No orders of the defined types were placed in this encounter. ? ? ? ? Procedures: ?No procedures performed ? ? ?Clinical Data: ?No additional findings. ? ? ?Subjective: ?Chief Complaint  ?Patient presents with  ? Right Hand - Pain  ? ? ?Is a 56 year old right-hand-dominant female who presents with right thumb pain at the MP joint.  She slipped and fell on 01/26/2022 with subsequent thumb MP joint pain.  The pain has improved but is still present.  It is diffuse in nature and not really on just the radial or ulnar side.  Her pain is worse with prolonged activity at work.  She occasionally have difficulty with activities that  require strong pinch.  She has not done any immobilization or any other treatment ? ? ?Review of Systems ? ? ?Objective: ?Vital Signs: LMP 12/31/2013  ? ?Physical Exam ?Constitutional:   ?   Appearance: Normal appearance.  ?Cardiovascular:  ?   Rate and Rhythm: Normal rate.  ?   Pulses: Normal pulses.  ?Pulmonary:  ?   Effort: Pulmonary effort is normal.  ?Skin: ?   General: Skin is warm and dry.  ?   Capillary Refill: Capillary refill takes less than 2 seconds.  ?Neurological:  ?   Mental Status: She is alert.  ? ? ?Right Hand Exam  ? ?Tenderness  ?Right hand tenderness location: TTP diffusely around MP joint. No swelling. ? ?Other  ?Erythema: absent ?Sensation: normal ?Pulse: present ? ?Comments:  Diffusely tender around MP joint.  Ulnar sided laxity present with radial deviation at 30 deg flexion but symmetric to contralateral side. No laxity in full extension.  No radial sided laxity.  ? ? ? ? ?Specialty Comments:  ?No specialty comments available. ? ?Imaging: ?No results found. ? ? ?PMFS History: ?Patient Active Problem List  ? Diagnosis Date Noted  ? Pain of right thumb 04/10/2021  ? Hyperlipidemia 07/07/2014  ? Angioedema of lips 06/04/2013  ? Overweight and obesity(278.0) 06/30/2012  ? Diabetes mellitus type 2 without retinopathy ([place]) 11/10/2009  ? Anemia 11/10/2009  ?  Bilateral chronic knee pain 11/10/2009  ? Essential hypertension, benign 10/06/2009  ? ?Past Medical History:  ?Diagnosis Date  ? Anemia   ? Arthritis   ? both knees  ? Depression   ? Diabetes mellitus without complication ([place])   ? GERD (gastroesophageal reflux disease)   ? Hyperlipidemia   ? Hypertension   ?  ?Family History  ?Problem Relation Age of Onset  ? Diabetes Father   ? Hypertension Father   ? Lung cancer Father   ? Diabetes Mother   ? Hypertension Mother   ? Hypertension Sister   ? Heart attack Maternal Aunt   ? Heart Problems Cousin   ? Colon cancer Neg Hx   ? Colon polyps Neg Hx   ? Esophageal cancer Neg Hx   ? Rectal cancer Neg  Hx   ? Stomach cancer Neg Hx   ?  ?Past Surgical History:  ?Procedure Laterality Date  ? HERNIA REPAIR    ? hernia surgery as a child  ? TUBAL LIGATION    ? ?Social History  ? ?Occupational History  ? Not on file  ?Tobacco Use  ? Smoking status: Former  ?  Types: Cigarettes  ?  Quit date: 03/02/1995  ?  Years since quitting: 26.1  ? Smokeless tobacco: Never  ?Vaping Use  ? Vaping Use: Never used  ?Substance and Sexual Activity  ? Alcohol use: Yes  ?  Comment: rarely  ? Drug use: Never  ? Sexual activity: Yes  ?  Birth control/protection: Surgical  ?  Comment: tubal ligation  ? ? ? ? ? ? ?

## 2021-05-03 ENCOUNTER — Other Ambulatory Visit: Payer: Self-pay | Admitting: Family Medicine

## 2021-05-03 DIAGNOSIS — Z139 Encounter for screening, unspecified: Secondary | ICD-10-CM

## 2021-05-11 ENCOUNTER — Inpatient Hospital Stay: Admission: RE | Admit: 2021-05-11 | Payer: 59 | Source: Ambulatory Visit

## 2021-05-18 ENCOUNTER — Ambulatory Visit (INDEPENDENT_AMBULATORY_CARE_PROVIDER_SITE_OTHER): Payer: 59 | Admitting: Orthopedic Surgery

## 2021-05-18 ENCOUNTER — Encounter: Payer: Self-pay | Admitting: Orthopedic Surgery

## 2021-05-18 DIAGNOSIS — M79644 Pain in right finger(s): Secondary | ICD-10-CM

## 2021-05-18 NOTE — Progress Notes (Signed)
? ?Office Visit Note ?  ?Patient: Colleen Ford           ?Date of Birth: 04/29/1965           ?MRN: [id number] ?Visit Date: 05/18/2021 ?             ?Requested by: [name], MD ?[address] ?[place],  [place] [address] ?PCP: [name], MD ? ? ?Assessment & Plan: ?Visit Diagnoses:  ?1. Pain of right thumb   ? ? ?Plan: Patient still has pain at the thumb MP joint now approximately 4 months out from injury.  She has more laxity than expected at the right thumb UCL but this is symmetric to the contralateral side and seemingly with a firm endpoint.  We discussed getting an MRI to evaluate her collateral ligament given its now been 4 months and she still symptomatic.  We discussed treatment options if there is evidence of a full-thickness collateral ligament injury versus rehab with hand therapy for a partial-thickness tear.  I will see her back in the office once the MRI is complete and we will review the results together. ? ?Follow-Up Instructions: No follow-ups on file.  ? ?Orders:  ?Orders Placed This Encounter  ?Procedures  ? MR HAND RIGHT WO CONTRAST  ? ?No orders of the defined types were placed in this encounter. ? ? ? ? Procedures: ?No procedures performed ? ? ?Clinical Data: ?No additional findings. ? ? ?Subjective: ?Chief Complaint  ?Patient presents with  ? Right Hand - Follow-up  ? ? ?Is a 56 year old right-hand-dominant female who presents for follow up of right thumb pain at the MP joint.  She slipped and fell on 01/26/2022 with subsequent thumb MP joint pain mostly on the ulnar side.  She was last seen about a month ago at which point the pain was improving but was still present.  Today she notes that this pain is still there.  She is difficulty with range of motion secondary to pain.  She has been wearing a thumb spica brace and taking an oral anti-inflammatory medication. ? ? ? ?Review of Systems ? ? ?Objective: ?Vital Signs: LMP 12/31/2013  ? ?Physical  Exam ?Constitutional:   ?   Appearance: Normal appearance.  ?Cardiovascular:  ?   Rate and Rhythm: Normal rate.  ?   Pulses: Normal pulses.  ?Pulmonary:  ?   Effort: Pulmonary effort is normal.  ?Skin: ?   General: Skin is warm and dry.  ?   Capillary Refill: Capillary refill takes less than 2 seconds.  ?Neurological:  ?   Mental Status: She is alert.  ? ? ?Right Hand Exam  ? ?Tenderness  ?Right hand tenderness location: TTP at thumb MP joint across the dorsal and ulnar aspects.  Mild to moderate swelling. ? ?Other  ?Erythema: absent ?Sensation: normal ?Pulse: present ? ?Comments:  Symmetric laxity of thumb UCL in flexion compared to the contralateral side.  Minimal laxity in full extension. Apparent firm end point.  Limited ROM secondary to pain and stiffness.  ? ? ? ? ?Specialty Comments:  ?No specialty comments available. ? ?Imaging: ?No results found. ? ? ?PMFS History: ?Patient Active Problem List  ? Diagnosis Date Noted  ? Pain of right thumb 04/10/2021  ? Hyperlipidemia 07/07/2014  ? Angioedema of lips 06/04/2013  ? Overweight and obesity(278.0) 06/30/2012  ? Diabetes mellitus type 2 without retinopathy ([facility] [facility]) 11/10/2009  ? Anemia 11/10/2009  ? Bilateral chronic knee pain 11/10/2009  ? Essential hypertension, benign  10/06/2009  ? ?Past Medical History:  ?Diagnosis Date  ? Anemia   ? Arthritis   ? both knees  ? Depression   ? Diabetes mellitus without complication ([place])   ? GERD (gastroesophageal reflux disease)   ? Hyperlipidemia   ? Hypertension   ? Pain of right thumb 04/10/2021  ?  ?Family History  ?Problem Relation Age of Onset  ? Diabetes Father   ? Hypertension Father   ? Lung cancer Father   ? Diabetes Mother   ? Hypertension Mother   ? Hypertension Sister   ? Heart attack Maternal Aunt   ? Heart Problems Cousin   ? Colon cancer Neg Hx   ? Colon polyps Neg Hx   ? Esophageal cancer Neg Hx   ? Rectal cancer Neg Hx   ? Stomach cancer Neg Hx   ?  ?Past Surgical History:  ?Procedure Laterality Date  ? HERNIA  REPAIR    ? hernia surgery as a child  ? TUBAL LIGATION    ? ?Social History  ? ?Occupational History  ? Not on file  ?Tobacco Use  ? Smoking status: Former  ?  Types: Cigarettes  ?  Quit date: 03/02/1995  ?  Years since quitting: 26.2  ? Smokeless tobacco: Never  ?Vaping Use  ? Vaping Use: Never used  ?Substance and Sexual Activity  ? Alcohol use: Yes  ?  Comment: rarely  ? Drug use: Never  ? Sexual activity: Yes  ?  Birth control/protection: Surgical  ?  Comment: tubal ligation  ? ? ? ? ? ? ?

## 2021-05-30 ENCOUNTER — Encounter: Payer: Self-pay | Admitting: *Deleted

## 2021-06-09 ENCOUNTER — Ambulatory Visit (HOSPITAL_COMMUNITY)
Admission: RE | Admit: 2021-06-09 | Discharge: 2021-06-09 | Disposition: A | Discharge status: Home / Self Care | Payer: 59 | Source: Ambulatory Visit | Attending: Orthopedic Surgery | Admitting: Orthopedic Surgery

## 2021-06-09 DIAGNOSIS — M79644 Pain in right finger(s): Secondary | ICD-10-CM | POA: Diagnosis not present

## 2021-06-09 IMAGING — MR MR [PERSON_NAME]*[PERSON_NAME]* W/O CM
4 of 6 series · 19 of 40 positions shown · non-contrast
Comparison: Right thumb radiograph [DATE].

CLINICAL DATA: Hand pain, traumatic, neg xray (Ped 0-17y) Eval
right thumb UCL

EXAM:
MRI OF THE RIGHT HAND WITHOUT CONTRAST
TECHNIQUE: Multiplanar, multisequence MR imaging of the right hand was
performed. No intravenous contrast was administered.

[Series 5: T2 fat-sat · axial · 3.0mm · 0.23mm/px · z∈[-95,+10]mm · 5 of 36 slices shown (1 of 2)]
[im 1/36]
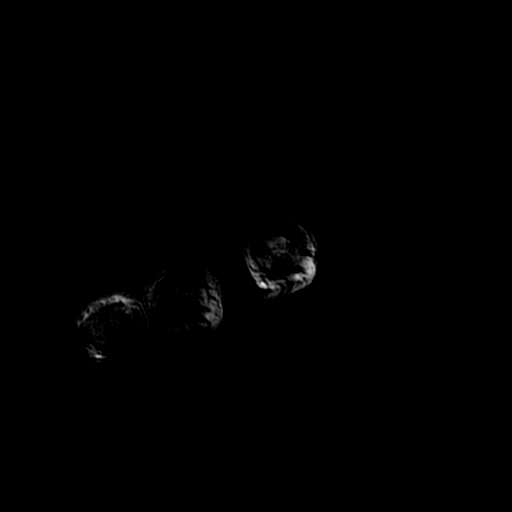
[im 5/36]
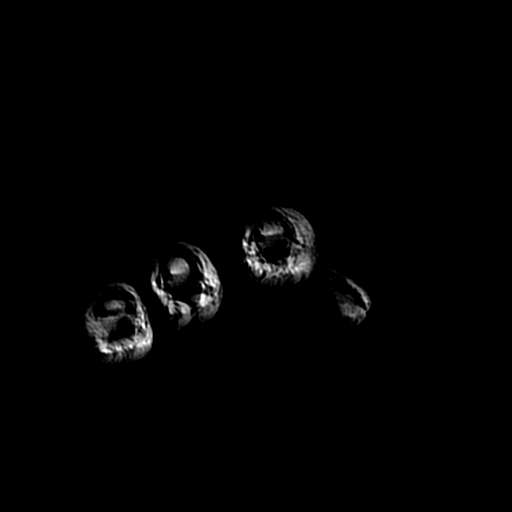
[im 9/36]
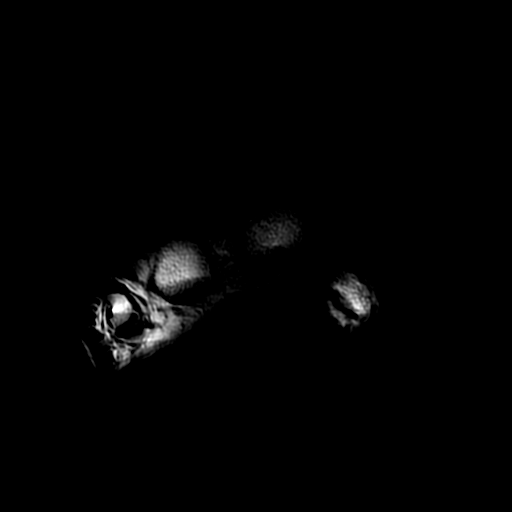
[im 18/36]
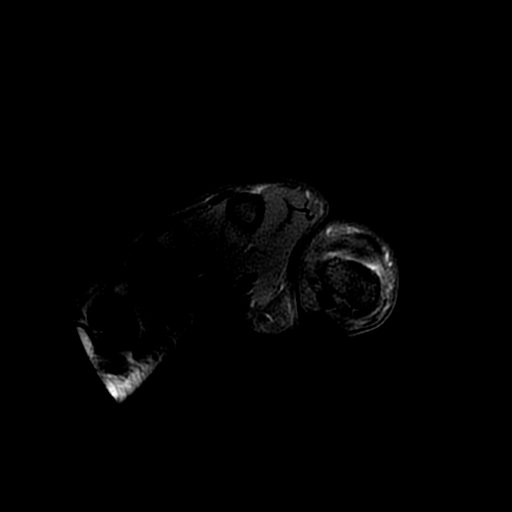
[im 31/36]
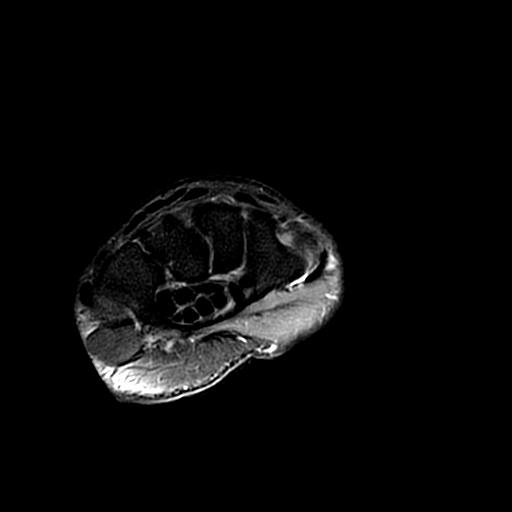

[Series 6: T2 fat-sat · oblique · 2.0mm · 0.21mm/px · 3 of 21 slices shown (2 of 2)]
[im 1/21]
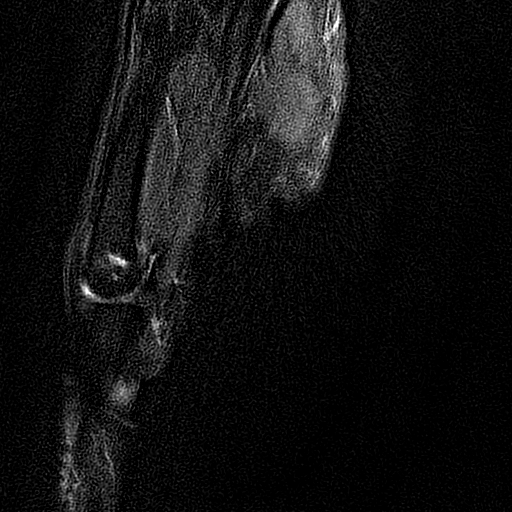
[im 11/21]
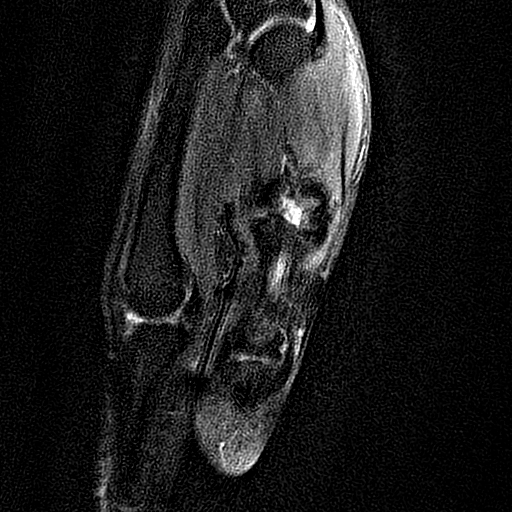
[im 21/21]
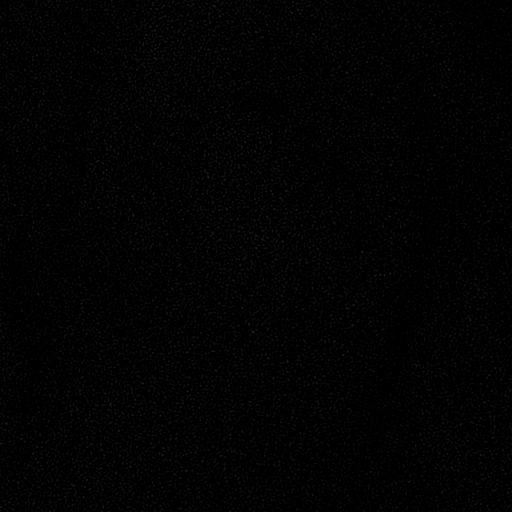

[Series 7: PD fat-sat · oblique · 2.0mm · 0.21mm/px · 5 of 21 slices shown (1 of 2)]
[im 1/21]
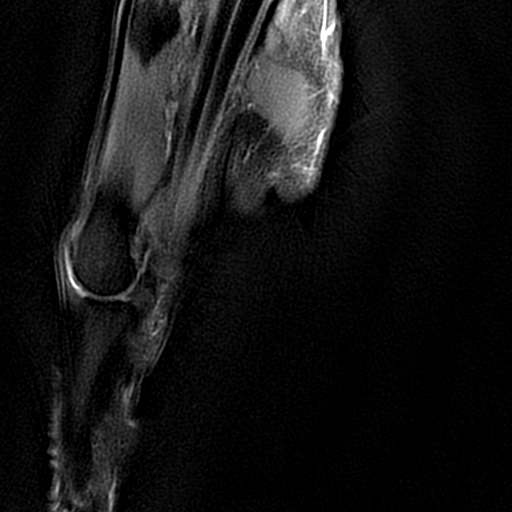
[im 6/21]
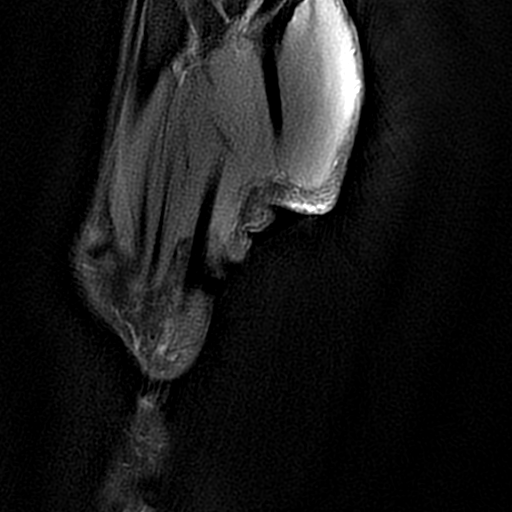
[im 11/21]
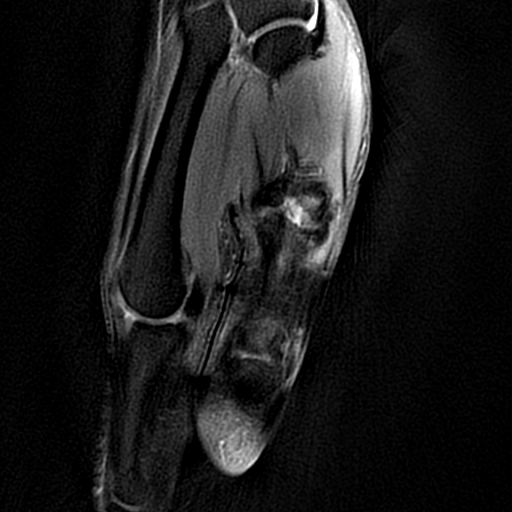
[im 16/21]
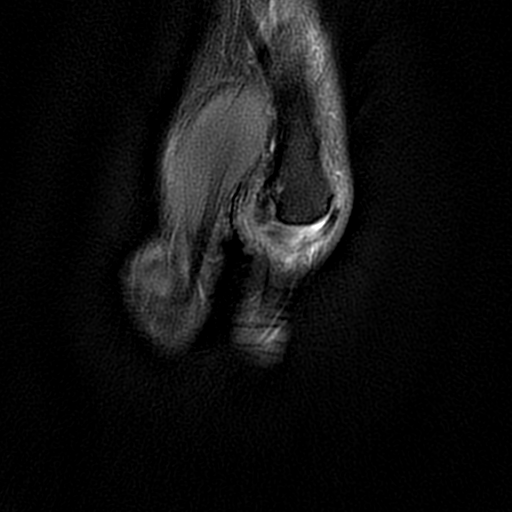
[im 21/21]
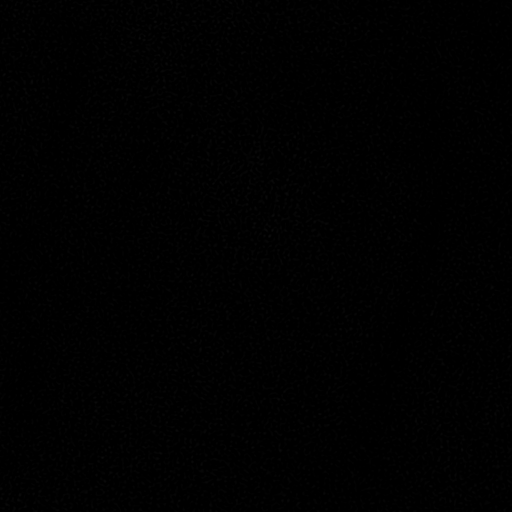

[Series 8: PD fat-sat · sagittal · 2.0mm · 0.23mm/px · 6 of 25 slices shown (2 of 2)]
[im 1/25]
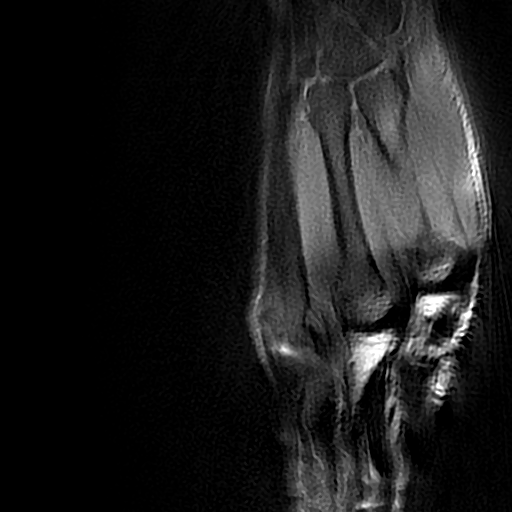
[im 5/25]
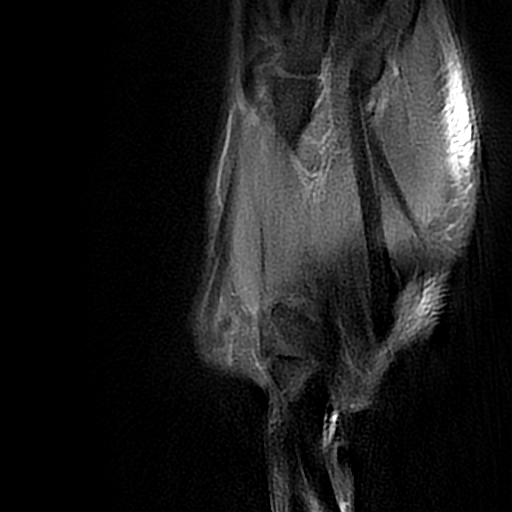
[im 10/25]
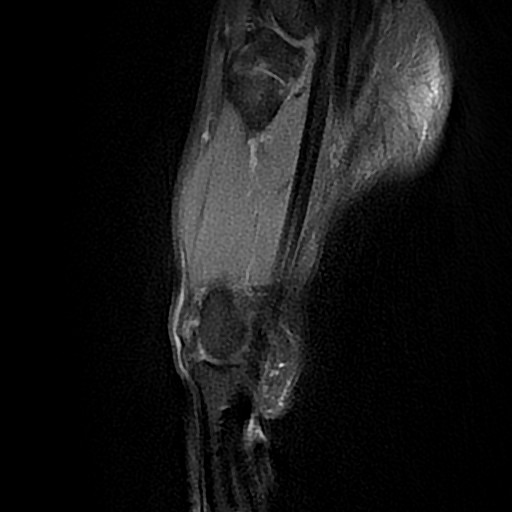
[im 15/25]
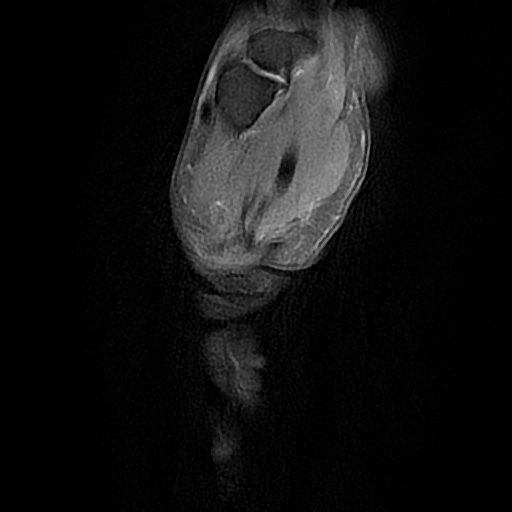
[im 20/25]
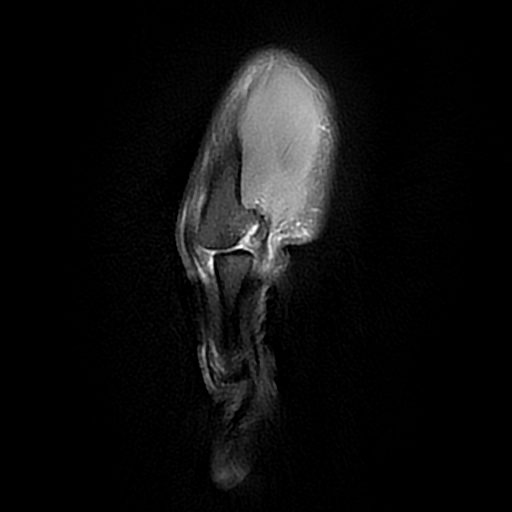
[im 25/25]
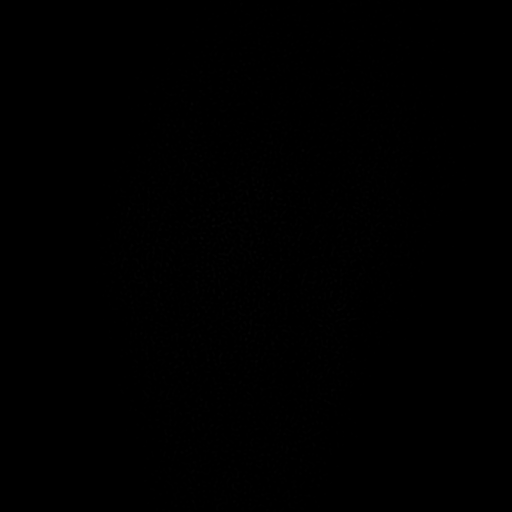

[19 of 40 positions shown; findings below may reference images not displayed]

FINDINGS: Bones/Joint/Cartilage

Mild thumb MCP osteoarthritis. There is a small cortical defect
along the volar ulnar aspect of the proximal phalanx (series 7,
image 10).

Ligaments

There is a full-thickness tear of the ulnar collateral ligament from
its distal attachment on the base of the proximal phalanx. There is
approximately 3 mm retraction near the proximal margin of the
adductor aponeurosis.

Muscles and Tendons

No acute tendon tear or significant tenosynovitis. No muscle atrophy
or significant muscle edema. Probable artifact along the thenar
musculature.

Soft tissues

No focal fluid collection. Minimal edema signal at the thumb MCP
joint.
IMPRESSION: Full-thickness tear of the thumb MCP ulnar collateral ligament from
its distal attachment on the base of proximal phalanx with
approximately 3 mm retraction near the proximal margin of the
adductor aponeurosis.

## 2021-06-12 ENCOUNTER — Encounter: Payer: Self-pay | Admitting: Orthopedic Surgery

## 2021-06-12 ENCOUNTER — Ambulatory Visit (INDEPENDENT_AMBULATORY_CARE_PROVIDER_SITE_OTHER): Payer: 59 | Admitting: Orthopedic Surgery

## 2021-06-12 DIAGNOSIS — S63641A Sprain of metacarpophalangeal joint of right thumb, initial encounter: Secondary | ICD-10-CM | POA: Diagnosis not present

## 2021-06-12 HISTORY — DX: Sprain of metacarpophalangeal joint of right thumb, initial encounter: S63.641A

## 2021-06-12 NOTE — Progress Notes (Signed)
? ?Office Visit Note ?  ?Patient: Colleen Ford           ?Date of Birth: 1966-01-13           ?MRN: [id number] ?Visit Date: 06/12/2021 ?             ?Requested by: [name], MD ?[address] ?[place],  [place] [address] ?PCP: [name], MD ? ? ?Assessment & Plan: ?Visit Diagnoses:  ?1. Rupture of ulnar collateral ligament of right thumb, initial encounter   ? ? ?Plan: We reviewed her recent MRI which shows complete rupture of the right thumb ulnar collateral ligament with very mild retraction.  There is no evidence of a Stener lesion.  We again discussed the nature of UCL injuries and both conservative and surgical management.  We reviewed the risks of surgery including but not limited to bleeding, infection, damage to neurovascular structures, incomplete symptom relief, thumb stiffness, need for additional surgery.  We reviewed the nature of thumb UCL repair versus reconstruction palmaris autograft as well as the expected postoperative course and role of hand therapy and recovery.  After our discussion, she would like to proceed with surgical management.  Surgical date and time will be confirmed with the patient. ? ?Follow-Up Instructions: No follow-ups on file.  ? ?Orders:  ?No orders of the defined types were placed in this encounter. ? ?No orders of the defined types were placed in this encounter. ? ? ? ? Procedures: ?No procedures performed ? ? ?Clinical Data: ?No additional findings. ? ? ?Subjective: ?Chief Complaint  ?Patient presents with  ? Right Hand - Follow-up  ? ? 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  We are initially treating this nonoperatively as her pain seem to be improving.  At her last visit, she was still having difficulty with range of motion  despite conservative management.  An MRI was obtained which demonstrated a complete rupture of the thumb UCL.  She is still wearing a brace.  She still having symptoms in the area that is worse with prolonged activity. ? ? ? ?Review of Systems ? ? ?Objective: ?Vital Signs: LMP 12/31/2013  ? ?Physical Exam ?Constitutional:   ?   Appearance: Normal appearance.  ?Cardiovascular:  ?   Rate and Rhythm: Normal rate.  ?   Pulses: Normal pulses.  ?Pulmonary:  ?   Effort: Pulmonary effort is normal.  ?Skin: ?   General: Skin is warm and dry.  ?   Capillary Refill: Capillary refill takes less than 2 seconds.  ?Neurological:  ?   Mental Status: She is alert.  ? ? ?Right Hand Exam  ? ?Tenderness  ?Right hand tenderness location: TTP at ulnar aspect of thumb MP joint w/ mild to moderate swelling. ? ?Other  ?Erythema: absent ?Sensation: normal ?Pulse: present ? ?Comments:  Slightly increased laxity with radial deviation of thumb compared to contralateral side.  MP joint pain worse w/ attempted ROM.  ? ? ? ? ?Specialty Comments:  ?No specialty comments available. ? ?Imaging: ?No results found. ? ? ?PMFS History: ?Patient Active Problem List  ? Diagnosis Date Noted  ? Rupture of UCL of right thumb 06/12/2021  ? Pain of right thumb 04/10/2021  ? Hyperlipidemia 07/07/2014  ? Angioedema of lips 06/04/2013  ?  Overweight and obesity(278.0) 06/30/2012  ? Diabetes mellitus type 2 without retinopathy ([place]) 11/10/2009  ? Anemia 11/10/2009  ? Bilateral chronic knee pain 11/10/2009  ? Essential hypertension, benign 10/06/2009  ? ?Past Medical History:  ?Diagnosis Date  ? Anemia   ? Arthritis   ? both knees  ? Depression   ? Diabetes mellitus without complication ([place])   ? GERD (gastroesophageal reflux disease)   ? Hyperlipidemia   ? Hypertension   ? Pain of right thumb 04/10/2021  ?  ?Family History  ?Problem Relation Age of Onset  ? Diabetes Father   ? Hypertension Father   ? Lung cancer Father   ? Diabetes Mother   ? Hypertension Mother   ?  Hypertension Sister   ? Heart attack Maternal Aunt   ? Heart Problems Cousin   ? Colon cancer Neg Hx   ? Colon polyps Neg Hx   ? Esophageal cancer Neg Hx   ? Rectal cancer Neg Hx   ? Stomach cancer Neg Hx   ?  ?Past Surgical History:  ?Procedure Laterality Date  ? HERNIA REPAIR    ? hernia surgery as a child  ? TUBAL LIGATION    ? ?Social History  ? ?Occupational History  ? Not on file  ?Tobacco Use  ? Smoking status: Former  ?  Types: Cigarettes  ?  Quit date: 03/02/1995  ?  Years since quitting: 26.2  ? Smokeless tobacco: Never  ?Vaping Use  ? Vaping Use: Never used  ?Substance and Sexual Activity  ? Alcohol use: Yes  ?  Comment: rarely  ? Drug use: Never  ? Sexual activity: Yes  ?  Birth control/protection: Surgical  ?  Comment: tubal ligation  ? ? ? ? ? ? ?

## 2021-06-12 NOTE — H&P (View-Only) (Signed)
Office Visit Note   Patient: Colleen Ford           Date of Birth: November 24, 1965           MRN: [id number] Visit Date: 06/12/2021              Requested by: [name], MD [place],  [place] [address] PCP: [name], MD   Assessment & Plan: Visit Diagnoses:  1. Rupture of ulnar collateral ligament of right thumb, initial encounter     Plan: We reviewed her recent MRI which shows complete rupture of the right thumb ulnar collateral ligament with very mild retraction.  There is no evidence of a Stener lesion.  We again discussed the nature of UCL injuries and both conservative and surgical management.  We reviewed the risks of surgery including but not limited to bleeding, infection, damage to neurovascular structures, incomplete symptom relief, thumb stiffness, need for additional surgery.  We reviewed the nature of thumb UCL repair versus reconstruction palmaris autograft as well as the expected postoperative course and role of hand therapy and recovery.  After our discussion, she would like to proceed with surgical management.  Surgical date and time will be confirmed with the patient.  Follow-Up Instructions: No follow-ups on file.   Orders:  No orders of the defined types were placed in this encounter.  No orders of the defined types were placed in this encounter.     Procedures: No procedures performed   Clinical Data: No additional findings.   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  We are initially treating this nonoperatively as her pain seem to be improving.  At her last visit, she was still having difficulty with range of motion  despite conservative management.  An MRI was obtained which demonstrated a complete rupture of the thumb UCL.  She is still wearing a brace.  She still having symptoms in the area that is worse with prolonged activity.    Review of Systems   Objective: Vital Signs: LMP 12/31/2013   Physical Exam Constitutional:      Appearance: Normal appearance.  Cardiovascular:     Rate and Rhythm: Normal rate.     Pulses: Normal pulses.  Pulmonary:     Effort: Pulmonary effort is normal.  Skin:    General: Skin is warm and dry.     Capillary Refill: Capillary refill takes less than 2 seconds.  Neurological:     Mental Status: She is alert.    Right Hand Exam   Tenderness  Right hand tenderness location: TTP at ulnar aspect of thumb MP joint w/ mild to moderate swelling.  Other  Erythema: absent Sensation: normal Pulse: present  Comments:  Slightly increased laxity with radial deviation of thumb compared to contralateral side.  MP joint pain worse w/ attempted ROM.      Specialty Comments:  No specialty comments available.  Imaging: No results found.   PMFS History: Patient Active Problem List   Diagnosis Date Noted   Rupture of UCL of right thumb 06/12/2021   Pain of right thumb 04/10/2021   Hyperlipidemia 07/07/2014   Angioedema of lips 06/04/2013  Overweight and obesity(278.0) 06/30/2012   Diabetes mellitus type 2 without retinopathy ([place]) 11/10/2009   Anemia 11/10/2009   Bilateral chronic knee pain 11/10/2009   Essential hypertension, benign 10/06/2009   Past Medical History:  Diagnosis Date   Anemia    Arthritis    both knees   Depression    Diabetes mellitus without complication (HCC)    GERD (gastroesophageal reflux disease)    Hyperlipidemia    Hypertension    Pain of right thumb 04/10/2021    Family History  Problem Relation Age of Onset   Diabetes Father    Hypertension Father    Lung cancer Father    Diabetes Mother    Hypertension Mother     Hypertension Sister    Heart attack Maternal Aunt    Heart Problems Cousin    Colon cancer Neg Hx    Colon polyps Neg Hx    Esophageal cancer Neg Hx    Rectal cancer Neg Hx    Stomach cancer Neg Hx     Past Surgical History:  Procedure Laterality Date   HERNIA REPAIR     hernia surgery as a child   TUBAL LIGATION     Social History   Occupational History   Not on file  Tobacco Use   Smoking status: Former    Types: Cigarettes    Quit date: 03/02/1995    Years since quitting: 26.2   Smokeless tobacco: Never  Vaping Use   Vaping Use: Never used  Substance and Sexual Activity   Alcohol use: Yes    Comment: rarely   Drug use: Never   Sexual activity: Yes    Birth control/protection: Surgical    Comment: tubal ligation

## 2021-07-04 ENCOUNTER — Ambulatory Visit: Payer: 59 | Admitting: Podiatry

## 2021-07-09 NOTE — Progress Notes (Signed)
Notified April, scheduler for Dr. Tempie Donning, that we have been unable to reach patient for pre-op phone call.

## 2021-07-10 ENCOUNTER — Encounter (HOSPITAL_BASED_OUTPATIENT_CLINIC_OR_DEPARTMENT_OTHER): Payer: Self-pay | Admitting: Orthopedic Surgery

## 2021-07-10 ENCOUNTER — Encounter: Payer: Self-pay | Admitting: *Deleted

## 2021-07-10 ENCOUNTER — Encounter (HOSPITAL_BASED_OUTPATIENT_CLINIC_OR_DEPARTMENT_OTHER)
Admission: RE | Admit: 2021-07-10 | Discharge: 2021-07-10 | Disposition: A | Discharge status: Home / Self Care | Payer: 59 | Source: Ambulatory Visit | Attending: Orthopedic Surgery | Admitting: Orthopedic Surgery

## 2021-07-10 ENCOUNTER — Other Ambulatory Visit: Payer: Self-pay

## 2021-07-10 DIAGNOSIS — E119 Type 2 diabetes mellitus without complications: Secondary | ICD-10-CM | POA: Insufficient documentation

## 2021-07-10 DIAGNOSIS — S5331XA Traumatic rupture of right ulnar collateral ligament, initial encounter: Secondary | ICD-10-CM | POA: Diagnosis not present

## 2021-07-10 DIAGNOSIS — M79644 Pain in right finger(s): Secondary | ICD-10-CM | POA: Diagnosis present

## 2021-07-10 DIAGNOSIS — W010XXA Fall on same level from slipping, tripping and stumbling without subsequent striking against object, initial encounter: Secondary | ICD-10-CM | POA: Diagnosis not present

## 2021-07-10 DIAGNOSIS — Z79899 Other long term (current) drug therapy: Secondary | ICD-10-CM | POA: Diagnosis not present

## 2021-07-10 DIAGNOSIS — Z6830 Body mass index (BMI) 30.0-30.9, adult: Secondary | ICD-10-CM | POA: Diagnosis not present

## 2021-07-10 DIAGNOSIS — Z01810 Encounter for preprocedural cardiovascular examination: Secondary | ICD-10-CM | POA: Diagnosis present

## 2021-07-10 DIAGNOSIS — Z7984 Long term (current) use of oral hypoglycemic drugs: Secondary | ICD-10-CM | POA: Diagnosis not present

## 2021-07-10 DIAGNOSIS — E669 Obesity, unspecified: Secondary | ICD-10-CM | POA: Diagnosis not present

## 2021-07-10 DIAGNOSIS — I10 Essential (primary) hypertension: Secondary | ICD-10-CM | POA: Diagnosis not present

## 2021-07-10 DIAGNOSIS — F32A Depression, unspecified: Secondary | ICD-10-CM | POA: Diagnosis not present

## 2021-07-10 DIAGNOSIS — K219 Gastro-esophageal reflux disease without esophagitis: Secondary | ICD-10-CM | POA: Diagnosis not present

## 2021-07-10 DIAGNOSIS — Z87891 Personal history of nicotine dependence: Secondary | ICD-10-CM | POA: Diagnosis not present

## 2021-07-10 LAB — BASIC METABOLIC PANEL
Anion gap: 10 (ref 5–15)
BUN: 10 mg/dL (ref 6–20)
CO2: 27 mmol/L (ref 22–32)
Calcium: 9.1 mg/dL (ref 8.9–10.3)
Chloride: 102 mmol/L (ref 98–111)
Creatinine, Ser: 1.09 mg/dL — ABNORMAL HIGH (ref 0.44–1.00)
GFR, Estimated: 60 mL/min — ABNORMAL LOW (ref >60)
Glucose, Bld: 122 mg/dL — ABNORMAL HIGH (ref 70–99)
Potassium: 4.3 mmol/L (ref 3.5–5.1)
Sodium: 139 mmol/L (ref 135–145)

## 2021-07-10 NOTE — Progress Notes (Signed)

## 2021-07-11 ENCOUNTER — Ambulatory Visit (HOSPITAL_BASED_OUTPATIENT_CLINIC_OR_DEPARTMENT_OTHER): Payer: 59

## 2021-07-11 ENCOUNTER — Other Ambulatory Visit: Payer: Self-pay

## 2021-07-11 ENCOUNTER — Ambulatory Visit (HOSPITAL_BASED_OUTPATIENT_CLINIC_OR_DEPARTMENT_OTHER): Payer: 59 | Admitting: Anesthesiology

## 2021-07-11 ENCOUNTER — Ambulatory Visit (HOSPITAL_BASED_OUTPATIENT_CLINIC_OR_DEPARTMENT_OTHER)
Admission: RE | Admit: 2021-07-11 | Discharge: 2021-07-11 | Disposition: A | Discharge status: Home / Self Care | Payer: 59 | Attending: Orthopedic Surgery | Admitting: Orthopedic Surgery

## 2021-07-11 ENCOUNTER — Encounter (HOSPITAL_BASED_OUTPATIENT_CLINIC_OR_DEPARTMENT_OTHER)
Admission: RE | Disposition: A | Discharge status: Home / Self Care | Payer: Self-pay | Source: Home / Self Care | Attending: Orthopedic Surgery

## 2021-07-11 ENCOUNTER — Encounter (HOSPITAL_BASED_OUTPATIENT_CLINIC_OR_DEPARTMENT_OTHER): Payer: Self-pay | Admitting: Orthopedic Surgery

## 2021-07-11 DIAGNOSIS — S5331XA Traumatic rupture of right ulnar collateral ligament, initial encounter: Secondary | ICD-10-CM | POA: Insufficient documentation

## 2021-07-11 DIAGNOSIS — Z01818 Encounter for other preprocedural examination: Secondary | ICD-10-CM

## 2021-07-11 DIAGNOSIS — E669 Obesity, unspecified: Secondary | ICD-10-CM | POA: Insufficient documentation

## 2021-07-11 DIAGNOSIS — Z6830 Body mass index (BMI) 30.0-30.9, adult: Secondary | ICD-10-CM | POA: Insufficient documentation

## 2021-07-11 DIAGNOSIS — Z87891 Personal history of nicotine dependence: Secondary | ICD-10-CM | POA: Insufficient documentation

## 2021-07-11 DIAGNOSIS — W010XXA Fall on same level from slipping, tripping and stumbling without subsequent striking against object, initial encounter: Secondary | ICD-10-CM | POA: Insufficient documentation

## 2021-07-11 DIAGNOSIS — I10 Essential (primary) hypertension: Secondary | ICD-10-CM | POA: Insufficient documentation

## 2021-07-11 DIAGNOSIS — S63641A Sprain of metacarpophalangeal joint of right thumb, initial encounter: Secondary | ICD-10-CM | POA: Diagnosis not present

## 2021-07-11 DIAGNOSIS — E119 Type 2 diabetes mellitus without complications: Secondary | ICD-10-CM | POA: Insufficient documentation

## 2021-07-11 DIAGNOSIS — Z79899 Other long term (current) drug therapy: Secondary | ICD-10-CM | POA: Insufficient documentation

## 2021-07-11 DIAGNOSIS — F32A Depression, unspecified: Secondary | ICD-10-CM | POA: Insufficient documentation

## 2021-07-11 DIAGNOSIS — Z7984 Long term (current) use of oral hypoglycemic drugs: Secondary | ICD-10-CM | POA: Insufficient documentation

## 2021-07-11 DIAGNOSIS — K219 Gastro-esophageal reflux disease without esophagitis: Secondary | ICD-10-CM | POA: Insufficient documentation

## 2021-07-11 HISTORY — PX: ULNAR COLLATERAL LIGAMENT REPAIR: SHX6159

## 2021-07-11 LAB — GLUCOSE, CAPILLARY
Glucose-Capillary: 145 mg/dL — ABNORMAL HIGH (ref 70–99)
Glucose-Capillary: 160 mg/dL — ABNORMAL HIGH (ref 70–99)

## 2021-07-11 SURGERY — REPAIR, LIGAMENT, ULNAR COLLATERAL
Anesthesia: General | Site: Thumb | Laterality: Right

## 2021-07-11 MED ORDER — FENTANYL CITRATE (PF) 100 MCG/2ML IJ SOLN
INTRAMUSCULAR | Status: AC
  Filled 2021-07-11: qty 2

## 2021-07-11 MED ORDER — OXYCODONE HCL 5 MG PO TABS: 5.0000 mg | ORAL_TABLET | ORAL | 0 refills | Status: AC | PRN

## 2021-07-11 MED ORDER — MIDAZOLAM HCL 2 MG/2ML IJ SOLN
INTRAMUSCULAR | Status: AC
  Filled 2021-07-11: qty 2

## 2021-07-11 MED ORDER — ONDANSETRON HCL 4 MG/2ML IJ SOLN
INTRAMUSCULAR | Status: DC | PRN
  Administered 2021-07-11: 4 mg via INTRAVENOUS

## 2021-07-11 MED ORDER — CEFAZOLIN SODIUM-DEXTROSE 2-4 GM/100ML-% IV SOLN
2.0000 g | INTRAVENOUS | Status: AC
Start: 2021-07-11 — End: 2021-07-11
  Administered 2021-07-11: 2 g via INTRAVENOUS

## 2021-07-11 MED ORDER — FENTANYL CITRATE (PF) 100 MCG/2ML IJ SOLN
100.0000 ug | Freq: Once | INTRAMUSCULAR | Status: AC
  Administered 2021-07-11: 50 ug via INTRAVENOUS

## 2021-07-11 MED ORDER — DEXAMETHASONE SODIUM PHOSPHATE 10 MG/ML IJ SOLN
INTRAMUSCULAR | Status: DC | PRN
  Administered 2021-07-11: 10 mg via INTRAVENOUS

## 2021-07-11 MED ORDER — EPHEDRINE SULFATE (PRESSORS) 50 MG/ML IJ SOLN
INTRAMUSCULAR | Status: DC | PRN
  Administered 2021-07-11 (×2): 5 mg via INTRAVENOUS

## 2021-07-11 MED ORDER — ACETAMINOPHEN 500 MG PO TABS
1000.0000 mg | ORAL_TABLET | Freq: Once | ORAL | Status: AC
  Administered 2021-07-11: 1000 mg via ORAL

## 2021-07-11 MED ORDER — CLONIDINE HCL (ANALGESIA) 100 MCG/ML EP SOLN
EPIDURAL | Status: DC | PRN
  Administered 2021-07-11: 100 ug

## 2021-07-11 MED ORDER — CEFAZOLIN SODIUM-DEXTROSE 2-4 GM/100ML-% IV SOLN
INTRAVENOUS | Status: AC
  Filled 2021-07-11: qty 100

## 2021-07-11 MED ORDER — LIDOCAINE 2% (20 MG/ML) 5 ML SYRINGE
INTRAMUSCULAR | Status: DC | PRN
  Administered 2021-07-11: 60 mg via INTRAVENOUS

## 2021-07-11 MED ORDER — LACTATED RINGERS IV SOLN: INTRAVENOUS | Status: DC

## 2021-07-11 MED ORDER — 0.9 % SODIUM CHLORIDE (POUR BTL) OPTIME
TOPICAL | Status: DC | PRN
  Administered 2021-07-11: 60 mL

## 2021-07-11 MED ORDER — MIDAZOLAM HCL 2 MG/2ML IJ SOLN
2.0000 mg | Freq: Once | INTRAMUSCULAR | Status: AC
  Administered 2021-07-11: 2 mg via INTRAVENOUS

## 2021-07-11 MED ORDER — BUPIVACAINE-EPINEPHRINE (PF) 0.5% -1:200000 IJ SOLN
INTRAMUSCULAR | Status: DC | PRN
  Administered 2021-07-11: 30 mL via PERINEURAL

## 2021-07-11 MED ORDER — FENTANYL CITRATE (PF) 100 MCG/2ML IJ SOLN: 25.0000 ug | INTRAMUSCULAR | Status: DC | PRN

## 2021-07-11 MED ORDER — PHENYLEPHRINE HCL (PRESSORS) 10 MG/ML IV SOLN
INTRAVENOUS | Status: DC | PRN
  Administered 2021-07-11 (×2): 160 ug via INTRAVENOUS
  Administered 2021-07-11 (×2): 80 ug via INTRAVENOUS
  Administered 2021-07-11: 160 ug via INTRAVENOUS

## 2021-07-11 MED ORDER — ACETAMINOPHEN 500 MG PO TABS
ORAL_TABLET | ORAL | Status: AC
  Filled 2021-07-11: qty 2

## 2021-07-11 MED ORDER — ONDANSETRON HCL 4 MG/2ML IJ SOLN: 4.0000 mg | Freq: Once | INTRAMUSCULAR | Status: DC | PRN

## 2021-07-11 MED ORDER — PROPOFOL 10 MG/ML IV BOLUS
INTRAVENOUS | Status: DC | PRN
  Administered 2021-07-11: 150 mg via INTRAVENOUS
  Administered 2021-07-11: 90 mg via INTRAVENOUS

## 2021-07-11 SURGICAL SUPPLY — 57 items
ANCH SUT 2-0 3/8 CRC MIC FT 4 (Anchor) ×2 IMPLANT
ANCHOR FT CORKSCREW MICRO 2-0 (Anchor) ×2 IMPLANT
APL PRP STRL LF DISP 70% ISPRP (MISCELLANEOUS) ×1
BLADE SURG 15 STRL LF DISP TIS (BLADE) ×1 IMPLANT
BLADE SURG 15 STRL SS (BLADE) ×2
BNDG CMPR 5X2 CHSV 1 LYR STRL (GAUZE/BANDAGES/DRESSINGS) ×1
BNDG CMPR 9X4 STRL LF SNTH (GAUZE/BANDAGES/DRESSINGS) ×1
BNDG COHESIVE 2X5 TAN ST LF (GAUZE/BANDAGES/DRESSINGS) ×1 IMPLANT
BNDG ELASTIC 3X5.8 VLCR STR LF (GAUZE/BANDAGES/DRESSINGS) ×2 IMPLANT
BNDG ESMARK 4X9 LF (GAUZE/BANDAGES/DRESSINGS) ×2 IMPLANT
BNDG GAUZE ELAST 4 BULKY (GAUZE/BANDAGES/DRESSINGS) ×2 IMPLANT
BNDG PLASTER X FAST 3X3 WHT LF (CAST SUPPLIES) ×20 IMPLANT
BNDG PLASTER X FAST 4X5 WHT LF (CAST SUPPLIES) ×20 IMPLANT
BNDG PLSTR 5X4 XFST ST WHT LF (CAST SUPPLIES) ×10
BNDG PLSTR 9X3 FST ST WHT (CAST SUPPLIES) ×10
CHLORAPREP W/TINT 26 (MISCELLANEOUS) ×2 IMPLANT
CORD BIPOLAR FORCEPS 12FT (ELECTRODE) ×2 IMPLANT
COVER BACK TABLE 60X90IN (DRAPES) ×2 IMPLANT
COVER MAYO STAND STRL (DRAPES) ×2 IMPLANT
CUFF TOURN SGL QUICK 18X4 (TOURNIQUET CUFF) IMPLANT
CUFF TOURN SGL QUICK 24 (TOURNIQUET CUFF)
CUFF TRNQT CYL 24X4X16.5-23 (TOURNIQUET CUFF) IMPLANT
DRAPE EXTREMITY T 121X128X90 (DISPOSABLE) ×2 IMPLANT
DRAPE OEC MINIVIEW 54X84 (DRAPES) ×2 IMPLANT
DRAPE SURG 17X23 STRL (DRAPES) ×2 IMPLANT
DRAPE U-SHAPE 47X51 STRL (DRAPES) ×2 IMPLANT
GAUZE SPONGE 4X4 12PLY STRL (GAUZE/BANDAGES/DRESSINGS) IMPLANT
GAUZE XEROFORM 1X8 LF (GAUZE/BANDAGES/DRESSINGS) IMPLANT
GLOVE BIO SURGEON STRL SZ7 (GLOVE) ×2 IMPLANT
GLOVE BIOGEL PI IND STRL 7.0 (GLOVE) ×1 IMPLANT
GLOVE BIOGEL PI INDICATOR 7.0 (GLOVE) ×2
GLOVE SURG SS PI 6.5 STRL IVOR (GLOVE) ×1 IMPLANT
GOWN STRL REUS W/ TWL LRG LVL3 (GOWN DISPOSABLE) ×1 IMPLANT
GOWN STRL REUS W/ TWL XL LVL3 (GOWN DISPOSABLE) ×1 IMPLANT
GOWN STRL REUS W/TWL LRG LVL3 (GOWN DISPOSABLE) ×2
GOWN STRL REUS W/TWL XL LVL3 (GOWN DISPOSABLE) ×2
K-WIRE DBL .045X4 NSTRL (WIRE) ×2
KWIRE DBL .045X4 NSTRL (WIRE) IMPLANT
NDL HYPO 25X1 1.5 SAFETY (NEEDLE) IMPLANT
NEEDLE HYPO 25X1 1.5 SAFETY (NEEDLE) IMPLANT
NS IRRIG 1000ML POUR BTL (IV SOLUTION) ×2 IMPLANT
PACK BASIN DAY SURGERY FS (CUSTOM PROCEDURE TRAY) ×2 IMPLANT
PAD CAST 3X4 CTTN HI CHSV (CAST SUPPLIES) ×1 IMPLANT
PADDING CAST COTTON 3X4 STRL (CAST SUPPLIES) ×2
SLEEVE SCD COMPRESS KNEE MED (STOCKING) IMPLANT
SLING ARM FOAM STRAP LRG (SOFTGOODS) IMPLANT
SPLINT FIBERGLASS 4X30 (CAST SUPPLIES) ×1 IMPLANT
SUT ETHIBOND 3-0 V-5 (SUTURE) IMPLANT
SUT ETHILON 4 0 PS 2 18 (SUTURE) ×2 IMPLANT
SUT MERSILENE 4 0 P 3 (SUTURE) ×1 IMPLANT
SUT MNCRL AB 3-0 PS2 18 (SUTURE) IMPLANT
SUT MNCRL AB 4-0 PS2 18 (SUTURE) ×2 IMPLANT
SUT VICRYL 4-0 PS2 18IN ABS (SUTURE) IMPLANT
SYR BULB EAR ULCER 3OZ GRN STR (SYRINGE) ×2 IMPLANT
SYR CONTROL 10ML LL (SYRINGE) IMPLANT
TOWEL GREEN STERILE FF (TOWEL DISPOSABLE) ×4 IMPLANT
UNDERPAD 30X36 HEAVY ABSORB (UNDERPADS AND DIAPERS) ×2 IMPLANT

## 2021-07-11 NOTE — Brief Op Note (Signed)
07/11/2021  2:45 PM  PATIENT:  Michel Bickers  56 y.o. female  PRE-OPERATIVE DIAGNOSIS:  RIGHT THUMB ULNAR COLLATERAL LIGAMENT RUPTURE  POST-OPERATIVE DIAGNOSIS:  RIGHT THUMB ULNAR COLLATERAL LIGAMENT   PROCEDURE:  Procedure(s): RIGHT THUMB ULNAR COLLATERAL LIGAMENT REPAIR (Right)  SURGEON:  Surgeon(s) and Role:    * Sherilyn Cooter, MD - Primary  PHYSICIAN ASSISTANT:   ASSISTANTS: none   ANESTHESIA:   general  EBL:  1 mL   BLOOD ADMINISTERED:none  DRAINS: none   LOCAL MEDICATIONS USED:  NONE  SPECIMEN:  No Specimen  DISPOSITION OF SPECIMEN:  N/A  COUNTS:  YES  TOURNIQUET:   Total Tourniquet Time Documented: Upper Arm (Right) - 65 minutes Total: Upper Arm (Right) - 65 minutes   DICTATION: .Viviann Spare Dictation  PLAN OF CARE: Discharge to home after PACU  PATIENT DISPOSITION:  PACU - hemodynamically stable.   Delay start of Pharmacological VTE agent (>24hrs) due to surgical blood loss or risk of bleeding: not applicable

## 2021-07-11 NOTE — Progress Notes (Signed)
Assisted Dr. Gifford Shave with right, axillary, ultrasound guided block. Side rails up, monitors on throughout procedure. See vital signs in flow sheet. Tolerated Procedure well.

## 2021-07-11 NOTE — Transfer of Care (Signed)
Immediate Anesthesia Transfer of Care Note  Patient: Colleen Ford  Procedure(s) Performed: RIGHT THUMB ULNAR COLLATERAL LIGAMENT REPAIR VS. RECONSTRUCTION (Right: Thumb)  Patient Location: PACU  Anesthesia Type:General and Regional  Level of Consciousness: drowsy  Airway & Oxygen Therapy: Patient Spontanous Breathing and Patient connected to face mask oxygen  Post-op Assessment: Report given to RN and Post -op Vital signs reviewed and stable  Post vital signs: Reviewed and stable  Last Vitals:  Vitals Value Taken Time  BP 103/73 07/11/21 1440  Temp    Pulse 69 07/11/21 1441  Resp 12 07/11/21 1441  SpO2 98 % 07/11/21 1441  Vitals shown include unvalidated device data.  Last Pain:  Vitals:   07/11/21 1116  TempSrc: Oral  PainSc: 0-No pain      Patients Stated Pain Goal: 4 (83/15/17 6160)  Complications: No notable events documented.

## 2021-07-11 NOTE — Interval H&P Note (Signed)
History and Physical Interval Note:  07/11/2021 11:36 AM  Colleen Ford  has presented today for surgery, with the diagnosis of RIGHT THUMB ULNAR COLLATERAL LIGAMENT RUPTURE.  The various methods of treatment have been discussed with the patient and family. After consideration of risks, benefits and other options for treatment, the patient has consented to  Procedure(s): RIGHT THUMB ULNAR COLLATERAL LIGAMENT REPAIR VS. RECONSTRUCTION (Right) as a surgical intervention.  The patient's history has been reviewed, patient examined, no change in status, stable for surgery.  I have reviewed the patient's chart and labs.  Questions were answered to the patient's satisfaction.     Devrin Monforte Dacey Milberger

## 2021-07-11 NOTE — Anesthesia Procedure Notes (Signed)
Anesthesia Regional Block: Axillary brachial plexus block   Pre-Anesthetic Checklist: , timeout performed,  Correct Patient, Correct Site, Correct Laterality,  Correct Procedure, Correct Position, site marked,  Risks and benefits discussed,  Surgical consent,  Pre-op evaluation,  At surgeon's request and post-op pain management  Laterality: Right  Prep: chloraprep       Needles:  Injection technique: Single-shot  Needle Type: Echogenic Needle     Needle Length: 9cm  Needle Gauge: 21     Additional Needles:   Procedures:,,,, ultrasound used (permanent image in chart),,    Narrative:  Start time: 07/11/2021 11:47 AM End time: 07/11/2021 11:53 AM Injection made incrementally with aspirations every 5 mL.  Performed by: Personally  Anesthesiologist: Santa Lighter, MD  Additional Notes: No pain on injection. No increased resistance to injection. Injection made in 5cc increments.  Good needle visualization.  Patient tolerated procedure well.

## 2021-07-11 NOTE — Anesthesia Preprocedure Evaluation (Addendum)
Anesthesia Evaluation  Patient identified by MRN, date of birth, ID band Patient awake    Reviewed: Allergy & Precautions, NPO status , Patient's Chart, lab work & pertinent test results  Airway Mallampati: II  TM Distance: >3 FB Neck ROM: Full    Dental  (+) Dental Advisory Given, Missing   Pulmonary former smoker,    Pulmonary exam normal breath sounds clear to auscultation       Cardiovascular hypertension, Pt. on medications Normal cardiovascular exam Rhythm:Regular Rate:Normal     Neuro/Psych PSYCHIATRIC DISORDERS Depression negative neurological ROS     GI/Hepatic Neg liver ROS, GERD  ,  Endo/Other  diabetes, Type 2, Oral Hypoglycemic AgentsObesity   Renal/GU negative Renal ROS     Musculoskeletal  (+) Arthritis , RIGHT THUMB ULNAR COLLATERAL LIGAMENT RUPTURE   Abdominal   Peds  Hematology negative hematology ROS (+)   Anesthesia Other Findings Day of surgery medications reviewed with the patient.  Reproductive/Obstetrics                            Anesthesia Physical Anesthesia Plan  ASA: 2  Anesthesia Plan: General   Post-op Pain Management: Regional block* and Tylenol PO (pre-op)*   Induction: Intravenous  PONV Risk Score and Plan: 3 and Midazolam, Dexamethasone and Ondansetron  Airway Management Planned: LMA  Additional Equipment:   Intra-op Plan:   Post-operative Plan: Extubation in OR  Informed Consent: I have reviewed the patients History and Physical, chart, labs and discussed the procedure including the risks, benefits and alternatives for the proposed anesthesia with the patient or authorized representative who has indicated his/her understanding and acceptance.     Dental advisory given  Plan Discussed with: CRNA  Anesthesia Plan Comments:         Anesthesia Quick Evaluation

## 2021-07-11 NOTE — Anesthesia Procedure Notes (Signed)
Procedure Name: LMA Insertion Date/Time: 07/11/2021 1:08 PM Performed by: Ezequiel Kayser, CRNA Pre-anesthesia Checklist: Patient identified, Emergency Drugs available, Suction available and Patient being monitored Patient Re-evaluated:Patient Re-evaluated prior to induction Oxygen Delivery Method: Circle System Utilized Preoxygenation: Pre-oxygenation with 100% oxygen Induction Type: IV induction Ventilation: Mask ventilation without difficulty LMA: LMA inserted LMA Size: 4.0 Number of attempts: 1 Airway Equipment and Method: Bite block Placement Confirmation: positive ETCO2 Tube secured with: Tape Dental Injury: Teeth and Oropharynx as per pre-operative assessment

## 2021-07-11 NOTE — Discharge Instructions (Addendum)
Audria Nine, M.D. Hand Surgery  POST-OPERATIVE DISCHARGE INSTRUCTIONS   PRESCRIPTIONS: You may have been given a prescription to be taken as directed for post-operative pain control.  You may also take over the counter ibuprofen/aleve and tylenol for pain. Take this as directed on the packaging. Do not exceed 3000 mg tylenol/acetaminophen in 24 hours.  Ibuprofen 600-800 mg (3-4) tablets by mouth every 6 hours as needed for pain.  OR Aleve 2 tablets by mouth every 12 hours (twice daily) as needed for pain.  AND/OR Tylenol 1000 mg (2 tablets) every 8 hours as needed for pain.  Please use your pain medication carefully, as refills are limited and you may not be provided with one.  As stated above, please use over the counter pain medicine - it will also be helpful with decreasing your swelling.    ANESTHESIA: After your surgery, post-surgical discomfort or pain is likely. This discomfort can last several days to a few weeks. At certain times of the day your discomfort may be more intense.   Did you receive a nerve block?  A nerve block can provide pain relief for one hour to two days after your surgery. As long as the nerve block is working, you will experience little or no sensation in the area the surgeon operated on.  As the nerve block wears off, you will begin to experience pain or discomfort. It is very important that you begin taking your prescribed pain medication before the nerve block fully wears off. Treating your pain at the first sign of the block wearing off will ensure your pain is better controlled and more tolerable when full-sensation returns. Do not wait until the pain is intolerable, as the medicine will be less effective. It is better to treat pain in advance than to try and catch up.   General Anesthesia:  If you did not receive a nerve block during your surgery, you will need to start taking your pain medication shortly after your surgery and should continue  to do so as prescribed by your surgeon.     ICE AND ELEVATION: You may use ice for the first 48-72 hours, but it is not critical.   Motion of your fingers is very important to decrease the swelling.  Elevation, as much as possible for the next 48 hours, is critical for decreasing swelling as well as for pain relief. Elevation means when you are seated or lying down, you hand should be at or above your heart. When walking, the hand needs to be at or above the level of your elbow.  If the bandage gets too tight, it may need to be loosened. Please contact our office and we will instruct you in how to do this.    SURGICAL BANDAGES:  Keep your dressing and/or splint clean and dry at all times.  Do not remove until you are seen again in the office.  If careful, you may place a plastic bag over your bandage and tape the end to shower, but be careful, do not get your bandages wet.     HAND THERAPY:  You may not need any. If you do, we will begin this at your follow up visit in the clinic.    ACTIVITY AND WORK: You are encouraged to move any fingers which are not in the bandage.  Light use of the fingers is allowed to assist the other hand with daily hygiene and eating, but strong gripping or lifting is often uncomfortable and  should be avoided.  You might miss a variable period of time from work and hopefully this issue has been discussed prior to surgery. You may not do any heavy work with your affected hand for about 2 weeks.    [facility] [address] [place],  [place]  [address] [phone]     Post Anesthesia Home Care Instructions  Activity: Get plenty of rest for the remainder of the day. A responsible individual must stay with you for 24 hours following the procedure.  For the next 24 hours, DO NOT: -Drive a car -Paediatric nurse -Drink alcoholic beverages -Take any medication unless instructed by your physician -Make any legal decisions or sign  important papers.  Meals: Start with liquid foods such as gelatin or soup. Progress to regular foods as tolerated. Avoid greasy, spicy, heavy foods. If nausea and/or vomiting occur, drink only clear liquids until the nausea and/or vomiting subsides. Call your physician if vomiting continues.  Special Instructions/Symptoms: Your throat may feel dry or sore from the anesthesia or the breathing tube placed in your throat during surgery. If this causes discomfort, gargle with warm salt water. The discomfort should disappear within 24 hours.  Regional Anesthesia Blocks  1. Numbness or the inability to move the "blocked" extremity may last from 3-48 hours after placement. The length of time depends on the medication injected and your individual response to the medication. If the numbness is not going away after 48 hours, call your surgeon.  2. The extremity that is blocked will need to be protected until the numbness is gone and the  Strength has returned. Because you cannot feel it, you will need to take extra care to avoid injury. Because it may be weak, you may have difficulty moving it or using it. You may not know what position it is in without looking at it while the block is in effect.  3. For blocks in the legs and feet, returning to weight bearing and walking needs to be done carefully. You will need to wait until the numbness is entirely gone and the strength has returned. You should be able to move your leg and foot normally before you try and bear weight or walk. You will need someone to be with you when you first try to ensure you do not fall and possibly risk injury.  4. Bruising and tenderness at the needle site are common side effects and will resolve in a few days.  5. Persistent numbness or new problems with movement should be communicated to the surgeon or the [place] [phone] [place] [phone]).   Next dose of Tylenol can be taken at 530pm  today.

## 2021-07-11 NOTE — Anesthesia Postprocedure Evaluation (Signed)
Anesthesia Post Note  Patient: Colleen Ford  Procedure(s) Performed: RIGHT THUMB ULNAR COLLATERAL LIGAMENT REPAIR (Right: Thumb)     Patient location during evaluation: PACU Anesthesia Type: General Level of consciousness: awake and alert Pain management: pain level controlled Vital Signs Assessment: post-procedure vital signs reviewed and stable Respiratory status: spontaneous breathing, nonlabored ventilation, respiratory function stable and patient connected to nasal cannula oxygen Cardiovascular status: blood pressure returned to baseline and stable Postop Assessment: no apparent nausea or vomiting Anesthetic complications: no   No notable events documented.  Last Vitals:  Vitals:   07/11/21 1510 07/11/21 1524  BP:  (!) 149/82  Pulse: 76 87  Resp: 14   Temp:  (!) 36.2 C  SpO2: 96% 97%    Last Pain:  Vitals:   07/11/21 1524  TempSrc: Oral  PainSc: 0-No pain                 Santa Lighter

## 2021-07-12 ENCOUNTER — Encounter (HOSPITAL_BASED_OUTPATIENT_CLINIC_OR_DEPARTMENT_OTHER): Payer: Self-pay | Admitting: Orthopedic Surgery

## 2021-07-16 NOTE — Op Note (Signed)
Date of Surgery: 07/16/2021  INDICATIONS: Patient is a 56 y.o.-year-old female with chronic right thumb pain at the [facility] joint that has failed conservative management.  MRI suggested complete rupture of the right thumb UCL with minimal retraction.  Risks, benefits, and alternatives to surgery were again discussed with the patient in the preoperative area. The patient wishes to proceed with surgery.  Informed consent was signed after our discussion.   PREOPERATIVE DIAGNOSIS:  Right thumb UCL tear  POSTOPERATIVE DIAGNOSIS: Same.  PROCEDURE:  Right thumb UCL repair   SURGEON: Audria Nine, M.D.  ASSIST:   ANESTHESIA:  general  IV FLUIDS AND URINE: See anesthesia.  ESTIMATED BLOOD LOSS: <5 mL.  IMPLANTS:  Implant Name Type Inv. Item Serial No. Manufacturer Lot No. LRB No. Used Action  ANCHOR FT CORKSCREW MICRO 2-0 - [id number] Anchor ANCHOR FT CORKSCREW MICRO 2-0  [employer] [id number] Right 1 Implanted  ANCHOR FT CORKSCREW MICRO 2-0 - [id number] Anchor ANCHOR FT CORKSCREW MICRO 2-0  [employer] [id number] Right 1 Implanted     DRAINS: None  COMPLICATIONS: None  DESCRIPTION OF PROCEDURE: The patient was met in the preoperative holding area where the surgical site was marked and the consent form was verified.  The patient was then taken to the operating room and transferred to the operating table.  All bony prominences were well padded.  A tourniquet was applied to the right upper arm.  General endotracheal anesthesia was induced.  The operative extremity was prepped and draped in the usual and sterile fashion.  A formal time-out was performed to confirm that this was the correct patient, surgery, side, and site.   Following timeout, the limb was gently exsanguinated with an Esmarch bandage and the tourniquet inflated to 250 mmHg.  A curvilinear incision was made from the dorsal aspect of the thumb metacarpal to the volar aspect of the thumb proximal phalanx.  The skin was incised.   Blunt dissection was used to identify a branch of the superficial branch of the radial nerve.  This was protected throughout the surgery.  A longitudinal incision was made at the distal aspect of the abductor aponeurosis.  The interval between the aponeurosis and the underlying ulnar collateral ligament was developed.  The ligament was found with an avulsed from its attachment at the volar base of the proximal phalanx.  There was very minimal retraction of the ligament.  The volar aspect of the proximal phalanx was prepared and the guidewire for a corkscrew anchor was placed.  Fluoroscopy demonstrated appropriate position of the K wire.  The K wire was removed and the Arthrex micro corkscrew anchor was then inserted.  The 2 limbs of the FiberWire were passed through the ligament and the ligament was tied down with the thumb held in approximately 30 degrees of flexion and ulnar deviation.  A 0.045 K wire was then advanced in a antegrade fashion from the radial aspect of the thumb metacarpal across the MCP joint into the proximal phalanx.  There was room for a second microsuture anchor which was placed dorsal to the first anchor.  The ligament was tied down in a similar fashion using a horizontal mattress suture.  At this point, the dorsal capsule was repaired to the dorsal aspect of the ligament.  The abductor aponeurosis was closed using a 4-0 Mersilene suture in a figure-of-eight type fashion.  The wound was thoroughly irrigated with copious sterile saline.  Tourniquet was let down hemostasis achieved with bipolar cautery and direct pressure  on the wound.  Thumb was warm and well-perfused.  Skin was closed using 4-0 nylon sutures in a horizontal mattress fashion.  The wound was dressed with Xeroform, 4 Kerlix, cast padding, and a well-padded thumb spica splint.  Patient was extubated uneventfully.  She was transferred to the postoperative bed.  All counts were correct x2 at the end of the procedure.  The patient  was then taken to PACU in stable condition.   POSTOPERATIVE PLAN: She will be discharged to home with appropriate pain medication and discharge instructions.  I will see her back in 10 to 14 days for her first postop visit.  Audria Nine, MD 4:17 PM

## 2021-07-23 ENCOUNTER — Ambulatory Visit (INDEPENDENT_AMBULATORY_CARE_PROVIDER_SITE_OTHER): Payer: 59 | Admitting: Orthopedic Surgery

## 2021-07-23 DIAGNOSIS — S63641A Sprain of metacarpophalangeal joint of right thumb, initial encounter: Secondary | ICD-10-CM

## 2021-07-23 NOTE — Progress Notes (Signed)
   Post-Op Visit Note   Patient: Colleen Ford           Date of Birth: 23-Feb-1965           MRN: [id number] Visit Date: 07/23/2021 PCP: [name], MD   Assessment & Plan:  Chief Complaint:  Chief Complaint  Patient presents with   Right Thumb - Routine Post Op   Visit Diagnoses:  1. Rupture of ulnar collateral ligament of right thumb, initial encounter     Plan: Patient is just under two weeks s/p right thumb UCL repair with suture anchors.  She is doing well postoperatively.  She has no pain in the thumb.  Her incision is well healed without surrounding erythema or induration.  The nylon sutures were removed.  The pin was cleaned with alcohol and dressed with xeroform.  I will see her back in another few weeks after she's worked with therapy.   Follow-Up Instructions: No follow-ups on file.   Orders:  Orders Placed This Encounter  Procedures   Ambulatory referral to Occupational Therapy   No orders of the defined types were placed in this encounter.   Imaging: No results found.  PMFS History: Patient Active Problem List   Diagnosis Date Noted   Rupture of UCL of right thumb 06/12/2021   Pain of right thumb 04/10/2021   Hyperlipidemia 07/07/2014   Angioedema of lips 06/04/2013   Overweight and obesity(278.0) 06/30/2012   Diabetes mellitus type 2 without retinopathy ([place]) 11/10/2009   Anemia 11/10/2009   Bilateral chronic knee pain 11/10/2009   Essential hypertension, benign 10/06/2009   Past Medical History:  Diagnosis Date   Anemia    Arthritis    both knees   Depression    Diabetes mellitus without complication (HCC)    GERD (gastroesophageal reflux disease)    Hyperlipidemia    Hypertension    Pain of right thumb 04/10/2021    Family History  Problem Relation Age of Onset   Diabetes Father    Hypertension Father    Lung cancer Father    Diabetes Mother    Hypertension Mother    Hypertension Sister    Heart attack Maternal Aunt     Heart Problems Cousin    Colon cancer Neg Hx    Colon polyps Neg Hx    Esophageal cancer Neg Hx    Rectal cancer Neg Hx    Stomach cancer Neg Hx     Past Surgical History:  Procedure Laterality Date   ANKLE SURGERY     2022   HERNIA REPAIR     hernia surgery as a child   TUBAL LIGATION     ULNAR COLLATERAL LIGAMENT REPAIR Right 07/11/2021   Procedure: RIGHT THUMB ULNAR COLLATERAL LIGAMENT REPAIR;  Surgeon: Sherilyn Cooter, MD;  Location: [place];  Service: Orthopedics;  Laterality: Right;   Social History   Occupational History   Not on file  Tobacco Use   Smoking status: Former    Types: Cigarettes    Quit date: 03/02/1995    Years since quitting: 26.4   Smokeless tobacco: Never  Vaping Use   Vaping Use: Never used  Substance and Sexual Activity   Alcohol use: Yes    Comment: rarely   Drug use: Never   Sexual activity: Yes    Birth control/protection: Surgical    Comment: tubal ligation

## 2021-07-25 ENCOUNTER — Telehealth: Payer: Self-pay | Admitting: Orthopedic Surgery

## 2021-07-25 ENCOUNTER — Encounter: Payer: Self-pay | Admitting: Rehabilitative and Restorative Service Providers"

## 2021-07-25 ENCOUNTER — Other Ambulatory Visit: Payer: Self-pay

## 2021-07-25 ENCOUNTER — Ambulatory Visit: Payer: 59 | Admitting: Rehabilitative and Restorative Service Providers"

## 2021-07-25 DIAGNOSIS — M6281 Muscle weakness (generalized): Secondary | ICD-10-CM

## 2021-07-25 DIAGNOSIS — M79641 Pain in right hand: Secondary | ICD-10-CM | POA: Diagnosis not present

## 2021-07-25 DIAGNOSIS — M25641 Stiffness of right hand, not elsewhere classified: Secondary | ICD-10-CM

## 2021-07-25 DIAGNOSIS — R600 Localized edema: Secondary | ICD-10-CM | POA: Diagnosis not present

## 2021-07-25 DIAGNOSIS — R278 Other lack of coordination: Secondary | ICD-10-CM

## 2021-07-25 NOTE — Therapy (Signed)
OUTPATIENT OCCUPATIONAL THERAPY ORTHO EVALUATION  Patient Name: Colleen Ford MRN: [id number] DOB:01/06/1966, 56 y.o., female Today's Date: 07/25/2021  PCP: Dr. Pearlean Brownie REFERRING PROVIDER: Dr. Marlyne Beards    OT End of Session - 07/25/21 1302     Visit Number 1    Number of Visits 14    Date for OT Re-Evaluation 09/21/21    Authorization Type [employer]    Authorization - Number of Visits 30    OT Start Time 1303    OT Stop Time 1353    OT Time Calculation (min) 50 min    Equipment Utilized During Treatment orthotic materials    Activity Tolerance Patient tolerated treatment well;No increased pain;Patient limited by fatigue;Patient limited by pain    Behavior During Therapy [facility] for tasks assessed/performed             Past Medical History:  Diagnosis Date   Anemia    Arthritis    both knees   Depression    Diabetes mellitus without complication (HCC)    GERD (gastroesophageal reflux disease)    Hyperlipidemia    Hypertension    Pain of right thumb 04/10/2021   Past Surgical History:  Procedure Laterality Date   ANKLE SURGERY     2022   HERNIA REPAIR     hernia surgery as a child   TUBAL LIGATION     ULNAR COLLATERAL LIGAMENT REPAIR Right 07/11/2021   Procedure: RIGHT THUMB ULNAR COLLATERAL LIGAMENT REPAIR;  Surgeon: Marlyne Beards, MD;  Location: [facility];  Service: Orthopedics;  Laterality: Right;   Patient Active Problem List   Diagnosis Date Noted   Rupture of UCL of right thumb 06/12/2021   Pain of right thumb 04/10/2021   Hyperlipidemia 07/07/2014   Angioedema of lips 06/04/2013   Overweight and obesity(278.0) 06/30/2012   Diabetes mellitus type 2 without retinopathy (HCC) 11/10/2009   Anemia 11/10/2009   Bilateral chronic knee pain 11/10/2009   Essential hypertension, benign 10/06/2009    ONSET DATE: 07/11/21 DOS, 01/26/22 DOI  REFERRING DIAG: W10.272Z (ICD-10-CM) - Rupture of ulnar collateral ligament of  right thumb, initial encounter  THERAPY DIAG:  Pain in right hand - Plan: Ot plan of care cert/re-cert  Stiffness of right hand, not elsewhere classified - Plan: Ot plan of care cert/re-cert  Muscle weakness (generalized) - Plan: Ot plan of care cert/re-cert  Localized edema - Plan: Ot plan of care cert/re-cert  Other lack of coordination - Plan: Ot plan of care cert/re-cert  Rationale for Evaluation and Treatment Rehabilitation  SUBJECTIVE:   SUBJECTIVE STATEMENT: 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 She states she slipped, fell and hurt her right thumb even though she landed on her left hand. She has shooting pains at time through thumb and up her arm, but at rest now it's not feeling too bad.    PRECAUTIONS: 2 weeks post-op right thumb UCL repair with k-wire anchor, caution for forces that radially deviate MCP J; AROM outside of orthotic at 4 weeks, AA/PROM at 5-6 weeks as tol, light activities at 7 weeks, D/C orthotic by weeks 8-10, 10-12 weeks return to full use  WEIGHT BEARING RESTRICTIONS Yes NWB right hand now   PAIN:  Are you having pain? No Rating: 0/10 at rest now, up to 10/10 in past week at worst "just sitting there doing nothing"   FALLS: Has patient fallen in  last 6 months? Yes. Number of falls 1 (his accident)   LIVING ENVIRONMENT: Lives with: lives with their family and lives with their son (x2)  PLOF: Independent  PATIENT GOALS To get her hand better to return to work, care for herself and her grandchildren.   OBJECTIVE:   HAND DOMINANCE: Right   ADLs: Overall ADLs: States decreased ability to grab, hold household objects, pain and inability to pen containers, perform all FMS tasks, etc.    FUNCTIONAL OUTCOME MEASURES: Eval: Patient Specific Functional Scale: 6.3 (work tasks, take care of grandson, wash/bathe)  UPPER EXTREMITY ROM      Eval: She had stiff active motion of all fingers, thumb IP J observed but no CMC or MCP J motion today, per protocols.  Wrist didn't seem overtly tight but was immobilized, so specific measures TBD PRN  Active ROM Right TBD  Elbow flexion   Elbow extension   Wrist flexion   Wrist extension   Wrist ulnar deviation   Wrist radial deviation   Wrist pronation   Wrist supination   (Blank rows = not tested)  Active ROM Right Eval ([employer])  Thumb MCP (0-60) Resting at ~ 45* bend  Thumb IP (0-80) ~0-35*  Thumb Radial abd/add (0-55)   Thumb Palmar abd/add (0-45)   Thumb Opposition to Small Finger   (Blank rows = not tested)   UPPER EXTREMITY MMT:    Eval: NT due to recent sx, unsafe   MMT Right eval  Elbow flexion   Elbow extension   Wrist flexion   Wrist extension   Wrist pronation   Wrist supination   (Blank rows = not tested)  HAND FUNCTION: Eval: overtly weak, decreased ability, unsafe to grip now. Grip strength Right: TBD lbs, Left: TBD lbs   COORDINATION: Eval: Overtly decreased ability to move in a coordinated fashion in right hand now.  Box and Blocks Test: TBD Blocks today (TBD is [facility]); 9 Hole Peg Test Right: TBDsec, Left: TBD sec   SENSATION: Eval:  Light touch intact today, though diminished around sx area and in thumb  EDEMA:   Eval: Mildly swollen in base of thumb today  COGNITION: Overall cognitive status: WFL for evaluation today   OBSERVATIONS:   Eval: Pin site looked clean and was wrapped with xeroform, sx site looks smooth and healing, her forearm did have a large rash from her wrist to elbow which she states "might have been an allergic reaction to the nerve block."  OT is unsure of the cause of these small, red bumps / irritation, but will monitor.    TODAY'S TREATMENT:  Eval: Custom orthotic fabrication was indicated due to pt's healing ligament repair and need for safe, functional positioning. OT fabricated custom hand based thumb spica  orthotic with IP J free for pt today to protect and immobilize thumb MCP and CMC Js. It fit well with no areas of pressure, pt states a comfortable fit. Pt was educated on the wearing schedule (wear all the time, do not take off except to clean pin site 1 x day), to call or come in ASAP if it is causing any irritation or is not achieving desired function. It will be checked/adjusted in upcoming sessions, as needed. Pt states understanding.   OT also edu on initial HEP (as below) and safety for sx wound care (pin site care, cleaning and re-wrapping), light scar desensitization, NWB in right hand, gentle motion at thumb IP J, wrist, and fingers in composite flex and ext  as tolerated.  She states understanding.   PATIENT EDUCATION: Education details: See tx section above for details  Person educated: Patient Education method: Verbal Instruction, Teach back, Handouts  Education comprehension: States and demonstrates understanding, Additional Education required    HOME EXERCISE PROGRAM: See tx section above for details   GOALS: Goals reviewed with patient? Yes   SHORT TERM GOALS: (STG required if POC>30 days)  Pt will obtain protective, custom orthotic. Target date: 07/25/21 Goal status: MET  2.  Pt will demo/state understanding of initial HEP to improve pain levels and prerequisite motion. Target date: 08/10/21 Goal status: INITIAL   LONG TERM GOALS:  Pt will improve functional ability by decreased impairment per assessment from 6.3 to 8 or better, for better quality of life. Target date: 09/21/21 Goal status: INITIAL  2.  Pt will improve grip strength in right hand to at least 40lbs for functional use at home and in IADLs. Target date: 09/21/21 Goal status: INITIAL  3.  Pt will improve A/ROM in right thumb MCP J flex/ext to at least 0-55*, to have functional motion for tasks like grasp/hold objects.  Target date: 09/21/21 Goal status: INITIAL  4.  Pt will improve strength in right  wrist and thumb flexion to at least 4+/5 MMT to have increased functional ability to carry out selfcare and higher-level homecare tasks with no difficulty. Target date: 09/21/21 Goal status: INITIAL  5.  Pt will decrease pain at worst from 10/10 to 3/10 or better to have better sleep and occupational participation in daily roles. Target date: 09/21/21 Goal status: INITIAL   ASSESSMENT:  CLINICAL IMPRESSION: Patient is a 56 y.o. female who was seen today for occupational therapy evaluation for right thumb pain, stiffness, weakness and decreased functional performance and occupational engagement.    PERFORMANCE DEFICITS in functional skills including ADLs, IADLs, coordination, dexterity, sensation, edema, ROM, strength, pain, fascial restrictions, flexibility, FMC, GMC, body mechanics, endurance, decreased knowledge of precautions, skin integrity, and UE functional use, cognitive skills including safety awareness, and psychosocial skills including coping strategies, environmental adaptation, habits, and routines and behaviors.   IMPAIRMENTS are limiting patient from ADLs, IADLs, rest and sleep, work, play, leisure, and social participation.   COMORBIDITIES may have co-morbidities  that affects occupational performance. Patient will benefit from skilled OT to address above impairments and improve overall function.  MODIFICATION OR ASSISTANCE TO COMPLETE EVALUATION: No modification of tasks or assist necessary to complete an evaluation.  OT OCCUPATIONAL PROFILE AND HISTORY: Detailed assessment: Review of records and additional review of physical, cognitive, psychosocial history related to current functional performance.  CLINICAL DECISION MAKING: Moderate - several treatment options, min-mod task modification necessary  REHAB POTENTIAL: Good  EVALUATION COMPLEXITY: Low      PLAN: OT FREQUENCY: 2x/week (or less to allow healing PRN)   OT DURATION: 8 weeks (though Sep 21, 2021)    PLANNED INTERVENTIONS: self care/ADL training, therapeutic exercise, therapeutic activity, neuromuscular re-education, manual therapy, scar mobilization, passive range of motion, splinting, ultrasound, fluidotherapy, compression bandaging, moist heat, cryotherapy, contrast bath, patient/family education, coping strategies training, DME and/or AE instructions, and Re-evaluation  RECOMMENDED OTHER SERVICES: none now   CONSULTED AND AGREED WITH PLAN OF CARE: Patient  PLAN FOR NEXT SESSION: Check orthotic, check cleanliness of wound/pin site, check initial motions and assign more strict HEP   Seeley Hissong, OTR/L, CHT 07/25/2021, 2:18 PM

## 2021-07-25 NOTE — Telephone Encounter (Signed)
Received $50.00 money order 2 medical records release forms and FMLA Paperwork/ Forwarding to [place] today

## 2021-07-27 ENCOUNTER — Telehealth: Payer: Self-pay | Admitting: Orthopedic Surgery

## 2021-07-27 NOTE — Telephone Encounter (Signed)
[employer] forms received. To [employer]. 

## 2021-07-31 ENCOUNTER — Ambulatory Visit (INDEPENDENT_AMBULATORY_CARE_PROVIDER_SITE_OTHER): Payer: 59 | Admitting: Rehabilitative and Restorative Service Providers"

## 2021-07-31 ENCOUNTER — Encounter: Payer: Self-pay | Admitting: Rehabilitative and Restorative Service Providers"

## 2021-07-31 DIAGNOSIS — M25641 Stiffness of right hand, not elsewhere classified: Secondary | ICD-10-CM

## 2021-07-31 DIAGNOSIS — M79641 Pain in right hand: Secondary | ICD-10-CM

## 2021-07-31 DIAGNOSIS — M6281 Muscle weakness (generalized): Secondary | ICD-10-CM | POA: Diagnosis not present

## 2021-07-31 DIAGNOSIS — R600 Localized edema: Secondary | ICD-10-CM

## 2021-07-31 DIAGNOSIS — R278 Other lack of coordination: Secondary | ICD-10-CM

## 2021-07-31 NOTE — Therapy (Signed)
OUTPATIENT OCCUPATIONAL THERAPY TREATMENT NOTE   Patient Name: Colleen Ford MRN: [id number] DOB:1965-12-09, 56 y.o., female Today's Date: 07/31/2021  PCP: Dr. Pearlean Brownie REFERRING PROVIDER: Dr. Marlyne Beards   END OF SESSION:   OT End of Session - 07/31/21 1305     Visit Number 2    Number of Visits 14    Date for OT Re-Evaluation 09/21/21    Authorization Type [employer]    Authorization - Number of Visits 30    OT Start Time 1305    OT Stop Time 1341    OT Time Calculation (min) 36 min    Equipment Utilized During Treatment orthotic materials    Activity Tolerance Patient tolerated treatment well;No increased pain;Patient limited by fatigue;Patient limited by pain    Behavior During Therapy [facility] for tasks assessed/performed             Past Medical History:  Diagnosis Date   Anemia    Arthritis    both knees   Depression    Diabetes mellitus without complication (HCC)    GERD (gastroesophageal reflux disease)    Hyperlipidemia    Hypertension    Pain of right thumb 04/10/2021   Past Surgical History:  Procedure Laterality Date   ANKLE SURGERY     2022   HERNIA REPAIR     hernia surgery as a child   TUBAL LIGATION     ULNAR COLLATERAL LIGAMENT REPAIR Right 07/11/2021   Procedure: RIGHT THUMB ULNAR COLLATERAL LIGAMENT REPAIR;  Surgeon: Marlyne Beards, MD;  Location: [facility];  Service: Orthopedics;  Laterality: Right;   Patient Active Problem List   Diagnosis Date Noted   Rupture of UCL of right thumb 06/12/2021   Pain of right thumb 04/10/2021   Hyperlipidemia 07/07/2014   Angioedema of lips 06/04/2013   Overweight and obesity(278.0) 06/30/2012   Diabetes mellitus type 2 without retinopathy (HCC) 11/10/2009   Anemia 11/10/2009   Bilateral chronic knee pain 11/10/2009   Essential hypertension, benign 10/06/2009    ONSET DATE: 07/11/21 DOS, 01/26/22 DOI   REFERRING DIAG: N82.956O (ICD-10-CM) - Rupture of ulnar  collateral ligament of right thumb, initial encounter  THERAPY DIAG:  Pain in right hand  Stiffness of right hand, not elsewhere classified  Localized edema  Muscle weakness (generalized)  Other lack of coordination  Rationale for Evaluation and Treatment Rehabilitation   PRECAUTIONS: ~3 weeks post-op right thumb UCL repair with k-wire anchor, caution for forces that radially deviate MCP J; AROM outside of orthotic at 4 weeks, AA/PROM at 5-6 weeks as tol, light activities at 7 weeks, D/C orthotic by weeks 8-10, 10-12 weeks return to full use   WEIGHT BEARING RESTRICTIONS Yes NWB right hand now   SUBJECTIVE:  She states having some pain in thumb at night last night that woke her up, she states cleaning pin every other day and feeling hypersensitive at scar. OT states to clean pin every day as asked.   PAIN:  Are you having pain? Yes Rating: 1/10 at rest now, up to 10/10 last night aching that woke her up     OBJECTIVE: (All objective assessments below are from initial evaluation on: 07/25/21 unless otherwise specified.)   HAND DOMINANCE: Right    ADLs: Overall ADLs: States decreased ability to grab, hold household objects, pain and inability to pen containers, perform all FMS tasks, etc.      FUNCTIONAL OUTCOME MEASURES: Eval: Patient Specific Functional Scale: 6.3 (work tasks, take care of grandson,  wash/bathe)   UPPER EXTREMITY ROM      Eval: She had stiff active motion of all fingers, thumb IP J observed but no CMC or MCP J motion today, per protocols.  Wrist didn't seem overtly tight but was immobilized, so specific measures TBD PRN   Active ROM Right 07/31/21  Elbow flexion    Elbow extension    Wrist flexion 50   (60* opp side)   Wrist extension 45  (60* opp side)   Wrist ulnar deviation    Wrist radial deviation    Wrist pronation 75  Wrist supination 75   (Blank rows = not tested)   Active ROM Right Eval ([employer]) Right TBD  Thumb MCP (0-60) Resting at  ~ 45* bend   Thumb IP (0-80) ~0-35*   Thumb Radial abd/add (0-55)     Thumb Palmar abd/add (0-45)     Thumb Opposition to Small Finger     (Blank rows = not tested)     UPPER EXTREMITY MMT:    Eval: NT due to recent sx, unsafe    MMT Right eval  Elbow flexion    Elbow extension    Wrist flexion    Wrist extension    Wrist pronation    Wrist supination    (Blank rows = not tested)   HAND FUNCTION: Eval: overtly weak, decreased ability, unsafe to grip now. Grip strength Right: TBD lbs, Left: TBD lbs    COORDINATION: Eval: Overtly decreased ability to move in a coordinated fashion in right hand now.  Box and Blocks Test: TBD Blocks today (TBD is [facility]); 9 Hole Peg Test Right: TBDsec, Left: TBD sec    SENSATION: Eval:  Light touch intact today, though diminished around sx area and in thumb   EDEMA:             Eval: Mildly swollen in base of thumb today   COGNITION: Overall cognitive status: WFL for evaluation today    OBSERVATIONS:            07/31/21: pin site looks clean, sx site closed and well healing.    Eval: Pin site looked clean and was wrapped with xeroform, sx site looks smooth and healing, her forearm did have a large rash from her wrist to elbow which she states "might have been an allergic reaction to the nerve block."  OT is unsure of the cause of these small, red bumps / irritation, but will monitor.      TODAY'S TREATMENT:  07/31/21: OT reviews pin site care, cleaning pin and re-wrapping. OT also provides HEP handouts going over each one with her, she performs back, no pain (as below). She does all with brace on.   Exercises - Standing Shoulder Flexion Full Range  - 3-4 x daily - 10 reps - Palm Up / Palm Down  - 3-4 x daily - 10 reps - Bend and Pull Back Wrist SLOWLY  - 3-4 x daily - 10 reps - "Windshield Wipers"   - 3-4 x daily - 10 reps - Tendon Glides  - 3-4 x daily - 4 reps - 2-3 seconds hold  Patient Education - Scar Massage  Eval: Custom orthotic  fabrication was indicated due to pt's healing ligament repair and need for safe, functional positioning. OT fabricated custom hand based thumb spica orthotic with IP J free for pt today to protect and immobilize thumb MCP and CMC Js. It fit well with no areas of pressure, pt states a comfortable  fit. Pt was educated on the wearing schedule (wear all the time, do not take off except to clean pin site 1 x day), to call or come in ASAP if it is causing any irritation or is not achieving desired function. It will be checked/adjusted in upcoming sessions, as needed. Pt states understanding.    OT also edu on initial HEP (as below) and safety for sx wound care (pin site care, cleaning and re-wrapping), light scar desensitization, NWB in right hand, gentle motion at thumb IP J, wrist, and fingers in composite flex and ext as tolerated.  She states understanding.    PATIENT EDUCATION: Education details: See tx section above for details  Person educated: Patient Education method: Verbal Instruction, Teach back, Handouts  Education comprehension: States and demonstrates understanding, Additional Education required      HOME EXERCISE PROGRAM: Access Code: [email or web address] URL: [email or web address] Date: 07/31/2021 Prepared by: Fannie Knee    GOALS: Goals reviewed with patient? Yes     SHORT TERM GOALS: (STG required if POC>30 days)   Pt will obtain protective, custom orthotic. Target date: 07/25/21 Goal status: MET   2.  Pt will demo/state understanding of initial HEP to improve pain levels and prerequisite motion. Target date: 08/10/21 Goal status: INITIAL     LONG TERM GOALS:   Pt will improve functional ability by decreased impairment per assessment from 6.3 to 8 or better, for better quality of life. Target date: 09/21/21 Goal status: INITIAL   2.  Pt will improve grip strength in right hand to at least 40lbs for functional use at home and in IADLs. Target date: 09/21/21 Goal  status: INITIAL   3.  Pt will improve A/ROM in right thumb MCP J flex/ext to at least 0-55*, to have functional motion for tasks like grasp/hold objects.  Target date: 09/21/21 Goal status: INITIAL   4.  Pt will improve strength in right wrist and thumb flexion to at least 4+/5 MMT to have increased functional ability to carry out selfcare and higher-level homecare tasks with no difficulty. Target date: 09/21/21 Goal status: INITIAL   5.  Pt will decrease pain at worst from 10/10 to 3/10 or better to have better sleep and occupational participation in daily roles. Target date: 09/21/21 Goal status: INITIAL     ASSESSMENT:   CLINICAL IMPRESSION: 07/31/21: She will f/u next week at 4 weeks post op to learn AROM at thumb as tol/per protocols  Eval: Patient is a 56 y.o. female who was seen today for occupational therapy evaluation for right thumb pain, stiffness, weakness and decreased functional performance and occupational engagement.       PLAN: OT FREQUENCY: 2x/week (or less to allow healing PRN)    OT DURATION: 8 weeks (though Sep 21, 2021)    PLANNED INTERVENTIONS: self care/ADL training, therapeutic exercise, therapeutic activity, neuromuscular re-education, manual therapy, scar mobilization, passive range of motion, splinting, ultrasound, fluidotherapy, compression bandaging, moist heat, cryotherapy, contrast bath, patient/family education, coping strategies training, DME and/or AE instructions, and Re-evaluation   RECOMMENDED OTHER SERVICES: none now    CONSULTED AND AGREED WITH PLAN OF CARE: Patient   PLAN FOR NEXT SESSION:  Check orthotic, Check and upgrade HEP at 4 weeks post op    Fannie Knee, OTR/L, CHT 07/31/2021, 1:43 PM

## 2021-08-02 ENCOUNTER — Encounter: Payer: 59 | Admitting: Rehabilitative and Restorative Service Providers"

## 2021-08-06 ENCOUNTER — Encounter: Payer: 59 | Admitting: Rehabilitative and Restorative Service Providers"

## 2021-08-08 ENCOUNTER — Ambulatory Visit (INDEPENDENT_AMBULATORY_CARE_PROVIDER_SITE_OTHER): Payer: 59 | Admitting: Rehabilitative and Restorative Service Providers"

## 2021-08-08 ENCOUNTER — Encounter: Payer: Self-pay | Admitting: Rehabilitative and Restorative Service Providers"

## 2021-08-08 DIAGNOSIS — R600 Localized edema: Secondary | ICD-10-CM

## 2021-08-08 DIAGNOSIS — M79641 Pain in right hand: Secondary | ICD-10-CM | POA: Diagnosis not present

## 2021-08-08 DIAGNOSIS — M25641 Stiffness of right hand, not elsewhere classified: Secondary | ICD-10-CM

## 2021-08-08 DIAGNOSIS — R278 Other lack of coordination: Secondary | ICD-10-CM | POA: Diagnosis not present

## 2021-08-08 DIAGNOSIS — M6281 Muscle weakness (generalized): Secondary | ICD-10-CM

## 2021-08-08 NOTE — Therapy (Signed)
OUTPATIENT OCCUPATIONAL THERAPY TREATMENT NOTE   Patient Name: Colleen Ford MRN: [id number] DOB:Mar 16, 1965, 56 y.o., female Today's Date: 08/08/2021  PCP: Dr. Talbert Cage REFERRING PROVIDER: Dr. Sherilyn Cooter   END OF SESSION:   OT End of Session - 08/08/21 1319     Visit Number 3    Number of Visits 14    Date for OT Re-Evaluation 09/21/21    Authorization Type [employer]    Authorization - Number of Visits 30    OT Start Time 1312    OT Stop Time 1348    OT Time Calculation (min) 36 min    Equipment Utilized During Treatment --    Activity Tolerance Patient tolerated treatment well;No increased pain;Patient limited by fatigue;Patient limited by pain    Behavior During Therapy [facility] for tasks assessed/performed              Past Medical History:  Diagnosis Date   Anemia    Arthritis    both knees   Depression    Diabetes mellitus without complication (HCC)    GERD (gastroesophageal reflux disease)    Hyperlipidemia    Hypertension    Pain of right thumb 04/10/2021   Past Surgical History:  Procedure Laterality Date   ANKLE SURGERY     2022   HERNIA REPAIR     hernia surgery as a child   TUBAL LIGATION     ULNAR COLLATERAL LIGAMENT REPAIR Right 07/11/2021   Procedure: RIGHT THUMB ULNAR COLLATERAL LIGAMENT REPAIR;  Surgeon: Sherilyn Cooter, MD;  Location: [place];  Service: Orthopedics;  Laterality: Right;   Patient Active Problem List   Diagnosis Date Noted   Rupture of UCL of right thumb 06/12/2021   Pain of right thumb 04/10/2021   Hyperlipidemia 07/07/2014   Angioedema of lips 06/04/2013   Overweight and obesity(278.0) 06/30/2012   Diabetes mellitus type 2 without retinopathy ([place]) 11/10/2009   Anemia 11/10/2009   Bilateral chronic knee pain 11/10/2009   Essential hypertension, benign 10/06/2009    ONSET DATE: 07/11/21 DOS, 01/26/22 DOI   REFERRING DIAG: T66.060O (ICD-10-CM) - Rupture of ulnar collateral ligament  of right thumb, initial encounter  THERAPY DIAG:  Pain in right hand  Stiffness of right hand, not elsewhere classified  Localized edema  Other lack of coordination  Muscle weakness (generalized)  Rationale for Evaluation and Treatment Rehabilitation   PRECAUTIONS: ~4 weeks post-op right thumb UCL repair with k-wire anchor, caution for forces that radially deviate MCP J; AROM outside of orthotic at 4 weeks, AA/PROM at 5-6 weeks as tol, light activities at 7 weeks, D/C orthotic by weeks 8-10, 10-12 weeks return to full use   WEIGHT BEARING RESTRICTIONS Yes NWB right hand now   SUBJECTIVE:  She arrives quite late and states having some itching around pin and some pain yesterday while sitting on the couch. Hand and brace somewhat smelling rank and not quite clean today. The straps have burn marks and she states putting it in the microwave because it "felt wet" but denies getting it wet.   PAIN:  Are you having pain? Yes  Rating: 5/10 at rest now, up to 10/10 last night still aching that woke her up   OBJECTIVE: (All objective assessments below are from initial evaluation on: 07/25/21 unless otherwise specified.)   HAND DOMINANCE: Right    ADLs: Overall ADLs: States decreased ability to grab, hold household objects, pain and inability to pen containers, perform all FMS tasks, etc.  FUNCTIONAL OUTCOME MEASURES: Eval: Patient Specific Functional Scale: 6.3 (work tasks, take care of grandson, wash/bathe)   UPPER EXTREMITY ROM      Eval: She had stiff active motion of all fingers, thumb IP J observed but no CMC or MCP J motion today, per protocols.  Wrist didn't seem overtly tight but was immobilized, so specific measures TBD PRN   Active ROM Right 07/31/21  Elbow flexion    Elbow extension    Wrist flexion 50   (60* opp side)   Wrist extension 45  (60* opp side)   Wrist ulnar deviation    Wrist radial deviation    Wrist pronation 75  Wrist supination 75   (Blank rows  = not tested)   Active ROM Right Eval ([employer]) Right TBD  Thumb MCP (0-60) Resting at ~ 45* bend   Thumb IP (0-80) ~0-35*   Thumb Radial abd/add (0-55)     Thumb Palmar abd/add (0-45)     Thumb Opposition to Small Finger     (Blank rows = not tested)     UPPER EXTREMITY MMT:    Eval: NT due to recent sx, unsafe    MMT Right eval  Elbow flexion    Elbow extension    Wrist flexion    Wrist extension    Wrist pronation    Wrist supination    (Blank rows = not tested)   HAND FUNCTION: Eval: overtly weak, decreased ability, unsafe to grip now. Grip strength Right: TBD lbs, Left: TBD lbs    COORDINATION: Eval: Overtly decreased ability to move in a coordinated fashion in right hand now.  Box and Blocks Test: TBD Blocks today (TBD is [facility]); 9 Hole Peg Test Right: TBDsec, Left: TBD sec    SENSATION: Eval:  Light touch intact today, though diminished around sx area and in thumb   EDEMA:             Eval: Mildly swollen in base of thumb today   COGNITION: Overall cognitive status: WFL for evaluation today    OBSERVATIONS:            08/08/21: surprisingly, her sx site appears not fully healed still (deeper layers healed, superficial layers not healed) but no dehiscence. OT edu to keep more dry as it may have been somewhat macerated interfering with healing.   07/31/21: pin site looks clean, sx site closed and well healing.    Eval: Pin site looked clean and was wrapped with xeroform, sx site looks smooth and healing, her forearm did have a large rash from her wrist to elbow which she states "might have been an allergic reaction to the nerve block."  OT is unsure of the cause of these small, red bumps / irritation, but will monitor.      TODAY'S TREATMENT:  08/08/21: OT cleans her hand and pin site, re-edu on pin site care, also sprays brace with Lysol spray and lets dry during therapy. OT goes over HEP and has her perform new, upgraded light AROM at thumb MCP J now as below. Very  tight and mildly painful, but otherwise seems WNL for this point in healing other than sx area not quite healed and her not keeping site as clean/dry as asked. She is cautioned about this.   Exercises - Standing Shoulder Flexion Full Range  - 3-4 x daily - 10 reps - Palm Up / Palm Down  - 3-4 x daily - 10 reps - Bend and Pull Back Wrist  SLOWLY  - 3-4 x daily - 10 reps - "Windshield Wipers"   - 3-4 x daily - 10 reps - Tendon Glides  - 3-4 x daily - 4 reps - 2-3 seconds hold - Thumb Opposition  - 3-4 x daily - 1 sets - 10 reps - Seated Thumb Composite Flexion AROM  - 3-4 x daily - 1 sets - 10-15 reps  Patient Education - Scar Massage   07/31/21: OT reviews pin site care, cleaning pin and re-wrapping. OT also provides HEP handouts going over each one with her, she performs back, no pain (as below). She does all with brace on.   Exercises - Standing Shoulder Flexion Full Range  - 3-4 x daily - 10 reps - Palm Up / Palm Down  - 3-4 x daily - 10 reps - Bend and Pull Back Wrist SLOWLY  - 3-4 x daily - 10 reps - "Windshield Wipers"   - 3-4 x daily - 10 reps - Tendon Glides  - 3-4 x daily - 4 reps - 2-3 seconds hold  Patient Education - Scar Massage  Eval: Custom orthotic fabrication was indicated due to pt's healing ligament repair and need for safe, functional positioning. OT fabricated custom hand based thumb spica orthotic with IP J free for pt today to protect and immobilize thumb MCP and CMC Js. It fit well with no areas of pressure, pt states a comfortable fit. Pt was educated on the wearing schedule (wear all the time, do not take off except to clean pin site 1 x day), to call or come in ASAP if it is causing any irritation or is not achieving desired function. It will be checked/adjusted in upcoming sessions, as needed. Pt states understanding.    OT also edu on initial HEP (as below) and safety for sx wound care (pin site care, cleaning and re-wrapping), light scar desensitization, NWB in  right hand, gentle motion at thumb IP J, wrist, and fingers in composite flex and ext as tolerated.  She states understanding.    PATIENT EDUCATION: Education details: See tx section above for details  Person educated: Patient Education method: Verbal Instruction, Teach back, Handouts  Education comprehension: States and demonstrates understanding, Additional Education required      HOME EXERCISE PROGRAM: Access Code: [email or web address] URL: [email or web address] Date: 07/31/2021 Prepared by: Benito Mccreedy    GOALS: Goals reviewed with patient? Yes     SHORT TERM GOALS: (STG required if POC>30 days)   Pt will obtain protective, custom orthotic. Target date: 07/25/21 Goal status: MET   2.  Pt will demo/state understanding of initial HEP to improve pain levels and prerequisite motion. Target date: 08/10/21 Goal status: INITIAL     LONG TERM GOALS:   Pt will improve functional ability by decreased impairment per assessment from 6.3 to 8 or better, for better quality of life. Target date: 09/21/21 Goal status: INITIAL   2.  Pt will improve grip strength in right hand to at least 40lbs for functional use at home and in IADLs. Target date: 09/21/21 Goal status: INITIAL   3.  Pt will improve A/ROM in right thumb MCP J flex/ext to at least 0-55*, to have functional motion for tasks like grasp/hold objects.  Target date: 09/21/21 Goal status: INITIAL   4.  Pt will improve strength in right wrist and thumb flexion to at least 4+/5 MMT to have increased functional ability to carry out selfcare and higher-level homecare tasks with no difficulty. Target date: 09/21/21 Goal status:  INITIAL   5.  Pt will decrease pain at worst from 10/10 to 3/10 or better to have better sleep and occupational participation in daily roles. Target date: 09/21/21 Goal status: INITIAL     ASSESSMENT:   CLINICAL IMPRESSION: 08/08/21: She appears nervous about her thumb, guarded. New motion is  tolerated well but very tight and mildly painful, but otherwise seems WNL for this point in healing other than sx area not quite healed and her not keeping site as clean/dry as asked. She is cautioned about this.  07/31/21: She will f/u next week at 4 weeks post op to learn AROM at thumb as tol/per protocols  Eval: Patient is a 56 y.o. female who was seen today for occupational therapy evaluation for right thumb pain, stiffness, weakness and decreased functional performance and occupational engagement.       PLAN: OT FREQUENCY: 2x/week (or less to allow healing PRN)    OT DURATION: 8 weeks (though Sep 21, 2021)    PLANNED INTERVENTIONS: self care/ADL training, therapeutic exercise, therapeutic activity, neuromuscular re-education, manual therapy, scar mobilization, passive range of motion, splinting, ultrasound, fluidotherapy, compression bandaging, moist heat, cryotherapy, contrast bath, patient/family education, coping strategies training, DME and/or AE instructions, and Re-evaluation   RECOMMENDED OTHER SERVICES: none now    CONSULTED AND AGREED WITH PLAN OF CARE: Patient   PLAN FOR NEXT SESSION:  Check orthotic, wound, and upgrade to 5 weeks post op (AA/ROM) keeping thumb mostly adducted to keep undue pressure from effecting UCL repair.   Benito Mccreedy, OTR/L, CHT 08/08/2021, 5:25 PM

## 2021-08-13 ENCOUNTER — Ambulatory Visit (INDEPENDENT_AMBULATORY_CARE_PROVIDER_SITE_OTHER): Payer: 59 | Admitting: Rehabilitative and Restorative Service Providers"

## 2021-08-13 ENCOUNTER — Encounter: Payer: Self-pay | Admitting: Rehabilitative and Restorative Service Providers"

## 2021-08-13 DIAGNOSIS — M6281 Muscle weakness (generalized): Secondary | ICD-10-CM

## 2021-08-13 DIAGNOSIS — M25641 Stiffness of right hand, not elsewhere classified: Secondary | ICD-10-CM

## 2021-08-13 DIAGNOSIS — R278 Other lack of coordination: Secondary | ICD-10-CM

## 2021-08-13 DIAGNOSIS — M79641 Pain in right hand: Secondary | ICD-10-CM

## 2021-08-13 DIAGNOSIS — R600 Localized edema: Secondary | ICD-10-CM

## 2021-08-13 NOTE — Therapy (Signed)
OUTPATIENT OCCUPATIONAL THERAPY TREATMENT NOTE   Patient Name: Colleen Ford MRN: [id number] DOB:06/26/1965, 56 y.o., female Today's Date: 08/13/2021  PCP: Dr. Talbert Cage REFERRING PROVIDER: Dr. Sherilyn Cooter   END OF SESSION:   OT End of Session - 08/13/21 1301     Visit Number 4    Number of Visits 14    Date for OT Re-Evaluation 09/21/21    Authorization Type [employer]    Authorization - Number of Visits 30    OT Start Time 1301    OT Stop Time 1325    OT Time Calculation (min) 24 min    Activity Tolerance Patient tolerated treatment well;No increased pain;Patient limited by fatigue;Patient limited by pain    Behavior During Therapy [facility] for tasks assessed/performed               Past Medical History:  Diagnosis Date   Anemia    Arthritis    both knees   Depression    Diabetes mellitus without complication (HCC)    GERD (gastroesophageal reflux disease)    Hyperlipidemia    Hypertension    Pain of right thumb 04/10/2021   Past Surgical History:  Procedure Laterality Date   ANKLE SURGERY     2022   HERNIA REPAIR     hernia surgery as a child   TUBAL LIGATION     ULNAR COLLATERAL LIGAMENT REPAIR Right 07/11/2021   Procedure: RIGHT THUMB ULNAR COLLATERAL LIGAMENT REPAIR;  Surgeon: Sherilyn Cooter, MD;  Location: [place];  Service: Orthopedics;  Laterality: Right;   Patient Active Problem List   Diagnosis Date Noted   Rupture of UCL of right thumb 06/12/2021   Pain of right thumb 04/10/2021   Hyperlipidemia 07/07/2014   Angioedema of lips 06/04/2013   Overweight and obesity(278.0) 06/30/2012   Diabetes mellitus type 2 without retinopathy ([place]) 11/10/2009   Anemia 11/10/2009   Bilateral chronic knee pain 11/10/2009   Essential hypertension, benign 10/06/2009    ONSET DATE: 07/11/21 DOS, 01/26/22 DOI   REFERRING DIAG: W58.099I (ICD-10-CM) - Rupture of ulnar collateral ligament of right thumb, initial  encounter  THERAPY DIAG:  Localized edema  Stiffness of right hand, not elsewhere classified  Pain in right hand  Other lack of coordination  Muscle weakness (generalized)  Rationale for Evaluation and Treatment Rehabilitation   PRECAUTIONS: ~5 weeks post-op right thumb UCL repair with k-wire anchor, caution for forces that radially deviate MCP J; AROM outside of orthotic at 4 weeks, AA/PROM at 5-6 weeks as tol & wean brace for light activities at 7 weeks, D/C orthotic by weeks 8-10, 10-12 weeks return to full use   WEIGHT BEARING RESTRICTIONS Yes NWB right hand now   SUBJECTIVE:  She states pain in night has been better, she is here with grandchild today.   PAIN:  Are you having pain? No Rating: 0/10 at rest now   OBJECTIVE: (All objective assessments below are from initial evaluation on: 07/25/21 unless otherwise specified.)   HAND DOMINANCE: Right    ADLs: Overall ADLs: States decreased ability to grab, hold household objects, pain and inability to pen containers, perform all FMS tasks, etc.      FUNCTIONAL OUTCOME MEASURES: Eval: Patient Specific Functional Scale: 6.3 (work tasks, take care of grandson, wash/bathe)   UPPER EXTREMITY ROM      Eval: She had stiff active motion of all fingers, thumb IP J observed but no CMC or MCP J motion today, per protocols.  Wrist didn't  seem overtly tight but was immobilized, so specific measures TBD PRN   Active ROM Right 07/31/21 Right 08/13/21  Elbow flexion     Elbow extension     Wrist flexion 50   (60* opp side)  79  Wrist extension 45  (60* opp side)  68  Wrist ulnar deviation     Wrist radial deviation     Wrist pronation 75   Wrist supination 75    (Blank rows = not tested)   Active ROM Right Eval ([employer]) Right 08/13/21  Thumb MCP (0-60) Resting at ~ 45* bend 31   Thumb IP (0-80) ~0-35* 29  Thumb Radial abd/add (0-55)     Thumb Palmar abd/add (0-45)     Thumb Opposition to Small Finger     (Blank rows = not  tested)     UPPER EXTREMITY MMT:    Eval: NT due to recent sx, unsafe    MMT Right eval  Elbow flexion    Elbow extension    Wrist flexion    Wrist extension    Wrist pronation    Wrist supination    (Blank rows = not tested)   HAND FUNCTION: Eval: overtly weak, decreased ability, unsafe to grip now. Grip strength Right: TBD lbs, Left: TBD lbs    COORDINATION: 08/13/21: 9 Hole Peg Test Right: 58sec (no pain)  Eval: Overtly decreased ability to move in a coordinated fashion in right hand now.     SENSATION: Eval:  Light touch intact today, though diminished around sx area and in thumb   EDEMA:             Eval: Mildly swollen in base of thumb today   COGNITION: Overall cognitive status: WFL for evaluation today    OBSERVATIONS:            08/08/21: surprisingly, her sx site appears not fully healed still (deeper layers healed, superficial layers not healed) but no dehiscence. OT edu to keep more dry as it may have been somewhat macerated interfering with healing.   07/31/21: pin site looks clean, sx site closed and well healing.    Eval: Pin site looked clean and was wrapped with xeroform, sx site looks smooth and healing, her forearm did have a large rash from her wrist to elbow which she states "might have been an allergic reaction to the nerve block."  OT is unsure of the cause of these small, red bumps / irritation, but will monitor.      TODAY'S TREATMENT:  08/13/21: OT quickly reviewed pin care with her, her HEP AROM from shoulder to hand, adding AAROM as tol in thumb flex and ext, keeping thumb close to palm to avoid stress on UCL, and scar massage giving out dycem to help with that. She also did fnl activity 9HPT for baseline measure, this was not painful to her today. Reviewed no fnl use of hand yet.   08/08/21: OT cleans her hand and pin site, re-edu on pin site care, also sprays brace with Lysol spray and lets dry during therapy. OT goes over HEP and has her perform  new, upgraded light AROM at thumb MCP J now as below. Very tight and mildly painful, but otherwise seems WNL for this point in healing other than sx area not quite healed and her not keeping site as clean/dry as asked. She is cautioned about this.   Exercises - Standing Shoulder Flexion Full Range  - 3-4 x daily - 10 reps -  Palm Up / [employer] Down  - 3-4 x daily - 10 reps - Bend and Pull Back Wrist SLOWLY  - 3-4 x daily - 10 reps - "Windshield Wipers"   - 3-4 x daily - 10 reps - Tendon Glides  - 3-4 x daily - 4 reps - 2-3 seconds hold - Thumb Opposition  - 3-4 x daily - 1 sets - 10 reps - Seated Thumb Composite Flexion AROM  - 3-4 x daily - 1 sets - 10-15 reps  Patient Education - Scar Massage     PATIENT EDUCATION: Education details: See tx section above for details  Person educated: Patient Education method: Verbal Instruction, Teach back, Handouts  Education comprehension: States and demonstrates understanding, Additional Education required      HOME EXERCISE PROGRAM: Access Code: [email or web address] URL: [email or web address] Date: 07/31/2021 Prepared by: Benito Mccreedy    GOALS: Goals reviewed with patient? Yes     SHORT TERM GOALS: (STG required if POC>30 days)   Pt will obtain protective, custom orthotic. Target date: 07/25/21 Goal status: MET   2.  Pt will demo/state understanding of initial HEP to improve pain levels and prerequisite motion. Target date: 08/10/21 Goal status: INITIAL     LONG TERM GOALS:   Pt will improve functional ability by decreased impairment per assessment from 6.3 to 8 or better, for better quality of life. Target date: 09/21/21 Goal status: INITIAL   2.  Pt will improve grip strength in right hand to at least 40lbs for functional use at home and in IADLs. Target date: 09/21/21 Goal status: INITIAL   3.  Pt will improve A/ROM in right thumb MCP J flex/ext to at least 0-55*, to have functional motion for tasks like grasp/hold objects.   Target date: 09/21/21 Goal status: INITIAL   4.  Pt will improve strength in right wrist and thumb flexion to at least 4+/5 MMT to have increased functional ability to carry out selfcare and higher-level homecare tasks with no difficulty. Target date: 09/21/21 Goal status: INITIAL   5.  Pt will decrease pain at worst from 10/10 to 3/10 or better to have better sleep and occupational participation in daily roles. Target date: 09/21/21 Goal status: INITIAL     ASSESSMENT:   CLINICAL IMPRESSION: 08/13/21: Her pin site looked clean and well cared for today. Her motion at thumb was a bit less but she was also nervous, watching her grandchild while trying not to have him grab her hand/thumb, etc. Tolerates AAROM well today  08/08/21: She appears nervous about her thumb, guarded. New motion is tolerated well but very tight and mildly painful, but otherwise seems WNL for this point in healing other than sx area not quite healed and her not keeping site as clean/dry as asked. She is cautioned about this.  07/31/21: She will f/u next week at 4 weeks post op to learn AROM at thumb as tol/per protocols  Eval: Patient is a 56 y.o. female who was seen today for occupational therapy evaluation for right thumb pain, stiffness, weakness and decreased functional performance and occupational engagement.       PLAN: OT FREQUENCY: 2x/week (or less to allow healing PRN)    OT DURATION: 8 weeks (though Sep 21, 2021)    PLANNED INTERVENTIONS: self care/ADL training, therapeutic exercise, therapeutic activity, neuromuscular re-education, manual therapy, scar mobilization, passive range of motion, splinting, ultrasound, fluidotherapy, compression bandaging, moist heat, cryotherapy, contrast bath, patient/family education, coping strategies training, DME and/or AE instructions, and Re-evaluation  RECOMMENDED OTHER SERVICES: none now    CONSULTED AND AGREED WITH PLAN OF CARE: Patient   PLAN FOR NEXT SESSION:   She will f/u with MD tomorrow and may have k-wire out.  Start to get into no-weight fnl activities in clinic and light use in orthotic, but no lengthy ADLs outside of orthotic until week 7 or so.   Benito Mccreedy, OTR/L, CHT 08/13/2021, 1:32 PM

## 2021-08-14 ENCOUNTER — Ambulatory Visit (INDEPENDENT_AMBULATORY_CARE_PROVIDER_SITE_OTHER): Payer: 59 | Admitting: Orthopedic Surgery

## 2021-08-14 ENCOUNTER — Ambulatory Visit: Payer: Self-pay

## 2021-08-14 ENCOUNTER — Encounter: Payer: Self-pay | Admitting: Orthopedic Surgery

## 2021-08-14 DIAGNOSIS — S63641A Sprain of metacarpophalangeal joint of right thumb, initial encounter: Secondary | ICD-10-CM

## 2021-08-14 NOTE — Progress Notes (Signed)
Post-Op Visit Note   Patient: Colleen Ford           Date of Birth: 06-Feb-1965           MRN: [id number] Visit Date: 08/14/2021 PCP: [name], MD   Assessment & Plan:  Chief Complaint:  Chief Complaint  Patient presents with   Right Thumb - Routine Post Op   Visit Diagnoses:  1. Rupture of ulnar collateral ligament of right thumb, initial encounter     Plan: Patient is approximately 5 weeks out from her surgery.  She is doing well postoperatively.  The K wire was removed today.  She is working with therapy on edema control and range of motion.  She will occasionally have mild pain in the thumb that she attributes to overuse.  He is wearing a Thermoplast brace today.  She is seeing therapy tomorrow.  We can start working on range of motion at the MP joint given the K wire removal today.  Follow-Up Instructions: No follow-ups on file.   Orders:  Orders Placed This Encounter  Procedures   XR Finger Thumb Right   No orders of the defined types were placed in this encounter.   Imaging: No results found.  PMFS History: Patient Active Problem List   Diagnosis Date Noted   Rupture of UCL of right thumb 06/12/2021   Pain of right thumb 04/10/2021   Hyperlipidemia 07/07/2014   Angioedema of lips 06/04/2013   Overweight and obesity(278.0) 06/30/2012   Diabetes mellitus type 2 without retinopathy ([place]) 11/10/2009   Anemia 11/10/2009   Bilateral chronic knee pain 11/10/2009   Essential hypertension, benign 10/06/2009   Past Medical History:  Diagnosis Date   Anemia    Arthritis    both knees   Depression    Diabetes mellitus without complication (HCC)    GERD (gastroesophageal reflux disease)    Hyperlipidemia    Hypertension    Pain of right thumb 04/10/2021   Rupture of UCL of right thumb 06/12/2021    Family History  Problem Relation Age of Onset   Diabetes Father    Hypertension Father    Lung cancer Father    Diabetes Mother    Hypertension  Mother    Hypertension Sister    Heart attack Maternal Aunt    Heart Problems Cousin    Colon cancer Neg Hx    Colon polyps Neg Hx    Esophageal cancer Neg Hx    Rectal cancer Neg Hx    Stomach cancer Neg Hx     Past Surgical History:  Procedure Laterality Date   ANKLE SURGERY     2022   HERNIA REPAIR     hernia surgery as a child   TUBAL LIGATION     ULNAR COLLATERAL LIGAMENT REPAIR Right 07/11/2021   Procedure: RIGHT THUMB ULNAR COLLATERAL LIGAMENT REPAIR;  Surgeon: Sherilyn Cooter, MD;  Location: [place];  Service: Orthopedics;  Laterality: Right;   Social History   Occupational History   Not on file  Tobacco Use   Smoking status: Former    Types: Cigarettes    Quit date: 03/02/1995    Years since quitting: 26.4   Smokeless tobacco: Never  Vaping Use   Vaping Use: Never used  Substance and Sexual Activity   Alcohol use: Yes    Comment: rarely   Drug use: Never   Sexual activity: Yes    Birth control/protection: Surgical    Comment: tubal ligation

## 2021-08-15 ENCOUNTER — Encounter: Payer: Self-pay | Admitting: Rehabilitative and Restorative Service Providers"

## 2021-08-15 ENCOUNTER — Ambulatory Visit (INDEPENDENT_AMBULATORY_CARE_PROVIDER_SITE_OTHER): Payer: 59 | Admitting: Rehabilitative and Restorative Service Providers"

## 2021-08-15 DIAGNOSIS — M79641 Pain in right hand: Secondary | ICD-10-CM

## 2021-08-15 DIAGNOSIS — R600 Localized edema: Secondary | ICD-10-CM

## 2021-08-15 DIAGNOSIS — R278 Other lack of coordination: Secondary | ICD-10-CM

## 2021-08-15 DIAGNOSIS — M25641 Stiffness of right hand, not elsewhere classified: Secondary | ICD-10-CM | POA: Diagnosis not present

## 2021-08-15 DIAGNOSIS — M6281 Muscle weakness (generalized): Secondary | ICD-10-CM

## 2021-08-15 NOTE — Therapy (Addendum)
OUTPATIENT OCCUPATIONAL THERAPY TREATMENT & DISCHARGE NOTE   Patient Name: Colleen Ford MRN: [id number] DOB:03/29/1965, 56 y.o., female Today's Date: 08/15/2021  PCP: Dr. Talbert Cage REFERRING PROVIDER: Dr. Sherilyn Cooter   END OF SESSION:   OT End of Session - 08/15/21 1303     Visit Number 5    Number of Visits 14    Date for OT Re-Evaluation 09/21/21    Authorization Type [employer]    Authorization - Number of Visits 30    OT Start Time 1302    OT Stop Time 1337    OT Time Calculation (min) 35 min    Activity Tolerance Patient tolerated treatment well;No increased pain;Patient limited by fatigue;Patient limited by pain    Behavior During Therapy [facility] for tasks assessed/performed                Past Medical History:  Diagnosis Date   Anemia    Arthritis    both knees   Depression    Diabetes mellitus without complication (HCC)    GERD (gastroesophageal reflux disease)    Hyperlipidemia    Hypertension    Pain of right thumb 04/10/2021   Rupture of UCL of right thumb 06/12/2021   Past Surgical History:  Procedure Laterality Date   ANKLE SURGERY     2022   HERNIA REPAIR     hernia surgery as a child   TUBAL LIGATION     ULNAR COLLATERAL LIGAMENT REPAIR Right 07/11/2021   Procedure: RIGHT THUMB ULNAR COLLATERAL LIGAMENT REPAIR;  Surgeon: Sherilyn Cooter, MD;  Location: [place];  Service: Orthopedics;  Laterality: Right;   Patient Active Problem List   Diagnosis Date Noted   Rupture of UCL of right thumb 06/12/2021   Pain of right thumb 04/10/2021   Hyperlipidemia 07/07/2014   Angioedema of lips 06/04/2013   Overweight and obesity(278.0) 06/30/2012   Diabetes mellitus type 2 without retinopathy ([place]) 11/10/2009   Anemia 11/10/2009   Bilateral chronic knee pain 11/10/2009   Essential hypertension, benign 10/06/2009    ONSET DATE: 07/11/21 DOS, 01/26/22 DOI   REFERRING DIAG: B15.176H (ICD-10-CM) - Rupture of ulnar  collateral ligament of right thumb, initial encounter  THERAPY DIAG:  Stiffness of right hand, not elsewhere classified  Other lack of coordination  Pain in right hand  Localized edema  Muscle weakness (generalized)  Rationale for Evaluation and Treatment Rehabilitation   PRECAUTIONS: 5 weeks post-op right thumb UCL repair with k-wire anchor, caution for forces that radially deviate MCP J; AROM outside of orthotic at 4 weeks, AA/PROM at 5-6 weeks as tol & wean brace for light activities at 7 weeks, D/C orthotic by weeks 8-10, 10-12 weeks return to full use   WEIGHT BEARING RESTRICTIONS Yes NWB right hand now   SUBJECTIVE:  She states has had some pain/soreness after k-wire removal, but no pain now.   PAIN:  Are you having pain? No  Rating: 0/10 at rest now   OBJECTIVE: (All objective assessments below are from initial evaluation on: 07/25/21 unless otherwise specified.)   HAND DOMINANCE: Right    ADLs: Overall ADLs: States decreased ability to grab, hold household objects, pain and inability to pen containers, perform all FMS tasks, etc.      FUNCTIONAL OUTCOME MEASURES: Eval: Patient Specific Functional Scale: 6.3 (work tasks, take care of grandson, wash/bathe)   UPPER EXTREMITY ROM      Eval: She had stiff active motion of all fingers, thumb IP J observed but  no CMC or MCP J motion today, per protocols.  Wrist didn't seem overtly tight but was immobilized, so specific measures TBD PRN   Active ROM Right 07/31/21 Right 08/13/21  Elbow flexion     Elbow extension     Wrist flexion 50   (60* opp side)  79  Wrist extension 45  (60* opp side)  68  Wrist ulnar deviation     Wrist radial deviation     Wrist pronation 75   Wrist supination 75    (Blank rows = not tested)   Active ROM Right Eval ([employer]) Right 08/13/21 Right 08/15/21  Thumb MCP (0-60) Resting at ~ 45* bend 31  0-50  Thumb IP (0-80) ~0-35* 29 0-13  Thumb Radial abd/add (0-55)      Thumb Palmar  abd/add (0-45)      Thumb Opposition to Small Finger      (Blank rows = not tested)     UPPER EXTREMITY MMT:    Eval: NT due to recent sx, unsafe    MMT Right eval  Elbow flexion    Elbow extension    Wrist flexion    Wrist extension    Wrist pronation    Wrist supination    (Blank rows = not tested)   HAND FUNCTION: Eval: overtly weak, decreased ability, unsafe to grip now. Grip strength Right: TBD lbs, Left: TBD lbs    COORDINATION: 08/13/21: 9 Hole Peg Test Right: 58sec (no pain)  Eval: Overtly decreased ability to move in a coordinated fashion in right hand now.     SENSATION: Eval:  Light touch intact today, though diminished around sx area and in thumb   EDEMA:             Eval: Mildly swollen in base of thumb today   COGNITION: Overall cognitive status: WFL for evaluation today    OBSERVATIONS:           08/15/21: K-wire site looks well healing from yesterday   08/08/21: surprisingly, her sx site appears not fully healed still (deeper layers healed, superficial layers not healed) but no dehiscence. OT edu to keep more dry as it may have been somewhat macerated interfering with healing.   07/31/21: pin site looks clean, sx site closed and well healing.    Eval: Pin site looked clean and was wrapped with xeroform, sx site looks smooth and healing, her forearm did have a large rash from her wrist to elbow which she states "might have been an allergic reaction to the nerve block."  OT is unsure of the cause of these small, red bumps / irritation, but will monitor.      TODAY'S TREATMENT:  08/15/21:  OT adjusts orthotic to allow more IP motion and teaches gentle IP J flexion stretch inside of orthotic and adds to HEP. Also edu on AAROM in composite thumb flex and ext (thumb closer to palm as she moves) as an upgrade to AROM. Additionally she was having small pain with opposition attempt to SF tip, so OT teaches to oppose to radial side of SF PIP J (this keeps thumb more  adducted to palm, less pressure on sx) and she tolerates better.  Other HEP for forearm and wrist motion reviewed as well as NWB and no fnl use of hand outside of orthotic for 2 more weeks. She is allowed to do no weight activities in orthotic as tolerated now (write name, fold t-shirt, etc.). Also shown how to do light self-massage around  base of thumb to feel better, gently loosen tissues.    08/13/21: OT quickly reviewed pin care with her, her HEP AROM from shoulder to hand, adding AAROM as tol in thumb flex and ext, keeping thumb close to palm to avoid stress on UCL, and scar massage giving out dycem to help with that. She also did fnl activity 9HPT for baseline measure, this was not painful to her today. Reviewed no fnl use of hand yet.   08/08/21: OT cleans her hand and pin site, re-edu on pin site care, also sprays brace with Lysol spray and lets dry during therapy. OT goes over HEP and has her perform new, upgraded light AROM at thumb MCP J now as below. Very tight and mildly painful, but otherwise seems WNL for this point in healing other than sx area not quite healed and her not keeping site as clean/dry as asked. She is cautioned about this.   Exercises - Standing Shoulder Flexion Full Range  - 3-4 x daily - 10 reps - Palm Up / Palm Down  - 3-4 x daily - 10 reps - Bend and Pull Back Wrist SLOWLY  - 3-4 x daily - 10 reps - "Windshield Wipers"   - 3-4 x daily - 10 reps - Tendon Glides  - 3-4 x daily - 4 reps - 2-3 seconds hold - Thumb Opposition  - 3-4 x daily - 1 sets - 10 reps - Seated Thumb Composite Flexion AROM  - 3-4 x daily - 1 sets - 10-15 reps  Patient Education - Scar Massage     PATIENT EDUCATION: Education details: See tx section above for details  Person educated: Patient Education method: Verbal Instruction, Teach back, Handouts  Education comprehension: States and demonstrates understanding, Additional Education required      HOME EXERCISE PROGRAM: Access Code:  [email or web address] URL: [email or web address] Date: 07/31/2021 Prepared by: Benito Mccreedy    GOALS: Goals reviewed with patient? Yes     SHORT TERM GOALS: (STG required if POC>30 days)   Pt will obtain protective, custom orthotic. Target date: 07/25/21 Goal status: MET   2.  Pt will demo/state understanding of initial HEP to improve pain levels and prerequisite motion. Target date: 08/10/21 Goal status: 08/15/21 MET     LONG TERM GOALS:   Pt will improve functional ability by decreased impairment per assessment from 6.3 to 8 or better, for better quality of life. Target date: 09/21/21 Goal status: INITIAL   2.  Pt will improve grip strength in right hand to at least 40lbs for functional use at home and in IADLs. Target date: 09/21/21 Goal status: INITIAL   3.  Pt will improve A/ROM in right thumb MCP J flex/ext to at least 0-55*, to have functional motion for tasks like grasp/hold objects.  Target date: 09/21/21 Goal status: INITIAL   4.  Pt will improve strength in right wrist and thumb flexion to at least 4+/5 MMT to have increased functional ability to carry out selfcare and higher-level homecare tasks with no difficulty. Target date: 09/21/21 Goal status: INITIAL   5.  Pt will decrease pain at worst from 10/10 to 3/10 or better to have better sleep and occupational participation in daily roles. Target date: 09/21/21 Goal status: INITIAL     ASSESSMENT:   CLINICAL IMPRESSION: 08/15/21: She is doing well, some typical sounding pains as part of healing process but none now. She feels comfortable managing this HEP herself next week and was recommended to return 7/25 or 7/26  for motion check, upgraded PROM and also weaning orthotic for light fnl activities.   08/13/21: Her pin site looked clean and well cared for today. Her motion at thumb was a bit less but she was also nervous, watching her grandchild while trying not to have him grab her hand/thumb, etc. Tolerates AAROM  well today  08/08/21: She appears nervous about her thumb, guarded. New motion is tolerated well but very tight and mildly painful, but otherwise seems WNL for this point in healing other than sx area not quite healed and her not keeping site as clean/dry as asked. She is cautioned about this.    PLAN: OT FREQUENCY: D/C   OT DURATION: D/C   PLANNED INTERVENTIONS: self care/ADL training, therapeutic exercise, therapeutic activity, neuromuscular re-education, manual therapy, scar mobilization, passive range of motion, splinting, ultrasound, fluidotherapy, compression bandaging, moist heat, cryotherapy, contrast bath, patient/family education, coping strategies training, DME and/or AE instructions, and Re-evaluation   RECOMMENDED OTHER SERVICES: none now    CONSULTED AND AGREED WITH PLAN OF CARE: Patient   PLAN FOR NEXT SESSION:  At 7 weeks post-op, motion check, upgrade PROM as tolerated to stretches at all joints of thumb and also weaning orthotic for light fnl activities. Do Box & Blocks or other fnl testing. Consider dynamics or dorsal taping should she have significant stiffness. See her more often if she is painful or not improving motion.   Benito Mccreedy, OTR/L, CHT 08/15/2021, 1:45 PM    OCCUPATIONAL THERAPY DISCHARGE SUMMARY  Visits from Start of Care: 5  The pt was not able to return to therapy, and lost contact with therapy team.  She did f/u wit hMD and return to work, but therapy goals could not be finalized. Please see notes for details. She is d/c at this point per policies.   Benito Mccreedy, OTR/L, CHT 10/25/21

## 2021-08-27 ENCOUNTER — Encounter: Payer: Self-pay | Admitting: Occupational Therapy

## 2021-08-29 ENCOUNTER — Encounter: Payer: 59 | Admitting: Occupational Therapy

## 2021-09-04 ENCOUNTER — Encounter: Payer: 59 | Admitting: Occupational Therapy

## 2021-09-04 ENCOUNTER — Ambulatory Visit (INDEPENDENT_AMBULATORY_CARE_PROVIDER_SITE_OTHER): Payer: 59 | Admitting: Orthopedic Surgery

## 2021-09-04 DIAGNOSIS — S63641A Sprain of metacarpophalangeal joint of right thumb, initial encounter: Secondary | ICD-10-CM

## 2021-09-04 NOTE — Progress Notes (Signed)
Post-Op Visit Note   Patient: Colleen Ford           Date of Birth: 05-Dec-1965           MRN: [id number] Visit Date: 09/04/2021 PCP: [name], MD   Assessment & Plan:  Chief Complaint:  Chief Complaint  Patient presents with   Right Thumb - Routine Post Op   Visit Diagnoses:  1. Rupture of ulnar collateral ligament of right thumb, initial encounter     Plan: Patient is approximately 8 weeks s/p right thumb UCL repair for complete UCL rupture.  She still describes pain in the MCPJ with certain activities.  She has a mild flexion contracture at the MCPJ with full thumb opposition.  The MCPJ feels stable to radial deviation in extension and 30 deg of flexion.  She has continued numbness at the ulnar aspect of the thumb over the incision.  She will continue to work with therapy on strengthening and ROM of the thumb.   Follow-Up Instructions: No follow-ups on file.   Orders:  No orders of the defined types were placed in this encounter.  No orders of the defined types were placed in this encounter.   Imaging: No results found.  PMFS History: Patient Active Problem List   Diagnosis Date Noted   Rupture of UCL of right thumb 06/12/2021   Pain of right thumb 04/10/2021   Hyperlipidemia 07/07/2014   Angioedema of lips 06/04/2013   Overweight and obesity(278.0) 06/30/2012   Diabetes mellitus type 2 without retinopathy ([place]) 11/10/2009   Anemia 11/10/2009   Bilateral chronic knee pain 11/10/2009   Essential hypertension, benign 10/06/2009   Past Medical History:  Diagnosis Date   Anemia    Arthritis    both knees   Depression    Diabetes mellitus without complication (HCC)    GERD (gastroesophageal reflux disease)    Hyperlipidemia    Hypertension    Pain of right thumb 04/10/2021   Rupture of UCL of right thumb 06/12/2021    Family History  Problem Relation Age of Onset   Diabetes Father    Hypertension Father    Lung cancer Father    Diabetes  Mother    Hypertension Mother    Hypertension Sister    Heart attack Maternal Aunt    Heart Problems Cousin    Colon cancer Neg Hx    Colon polyps Neg Hx    Esophageal cancer Neg Hx    Rectal cancer Neg Hx    Stomach cancer Neg Hx     Past Surgical History:  Procedure Laterality Date   ANKLE SURGERY     2022   HERNIA REPAIR     hernia surgery as a child   TUBAL LIGATION     ULNAR COLLATERAL LIGAMENT REPAIR Right 07/11/2021   Procedure: RIGHT THUMB ULNAR COLLATERAL LIGAMENT REPAIR;  Surgeon: Sherilyn Cooter, MD;  Location: [place];  Service: Orthopedics;  Laterality: Right;   Social History   Occupational History   Not on file  Tobacco Use   Smoking status: Former    Types: Cigarettes    Quit date: 03/02/1995    Years since quitting: 26.5   Smokeless tobacco: Never  Vaping Use   Vaping Use: Never used  Substance and Sexual Activity   Alcohol use: Yes    Comment: rarely   Drug use: Never   Sexual activity: Yes    Birth control/protection: Surgical    Comment: tubal ligation

## 2021-09-06 ENCOUNTER — Encounter: Payer: 59 | Admitting: Occupational Therapy

## 2021-09-12 ENCOUNTER — Encounter: Payer: 59 | Admitting: Rehabilitative and Restorative Service Providers"

## 2021-09-26 ENCOUNTER — Other Ambulatory Visit: Payer: Self-pay | Admitting: Family Medicine

## 2021-10-16 NOTE — Progress Notes (Unsigned)
    SUBJECTIVE:   CHIEF COMPLAINT / HPI:   DIABETES Taking metformin 1-2 times daily.  Not really following a diet   CHOLESTEROL Taking lipitor daily   HYPERTENSION Taking amlodipine 10 mg daily.  Not checking her blood pressure.  No chest pain or edema  PERTINENT  PMH / PSH: still wearing thumb brace  OBJECTIVE:   BP 136/88   Pulse 74   Wt 175 lb 9.6 oz (79.7 kg)   LMP 07/08/2013   SpO2 100%   BMI 31.11 kg/m   Heart - Regular rate and rhythm.  No murmurs, gallops or rubs.    Lungs:  Normal respiratory effort, chest expands symmetrically. Lungs are clear to auscultation, no crackles or wheezes. Extremities:  No cyanosis, edema, or deformity noted with good range of motion of all major joints.  Brace on R thumb   ASSESSMENT/PLAN:   Essential hypertension, benign Controlled today.  Will continue to monitor.  Check labs next visit  Diabetes mellitus type 2 without retinopathy Not at goal. Add Jardiance and urge twice a day metformin and better diet    Patient Instructions  Good to see you today - Thank you for coming in  Things we discussed today:  Diabetes Is not well controlled.   Keep taking metformin twice a day Start Jardiance 10 mg once a day.  You may notice you urinate a little more.  Do not take if you are dehydrated - vomiting or sweating a lot   Blood pressure Your goal blood pressure is less than 140/90.  Check your blood pressure several times a week.  If regularly higher than this please let me know - either with MyChart or leaving a phone message. Next visit please bring in your blood pressure cuff.     You need a Pap smear to prevent cervical cancer.  Please make an appointment.   You need an diabetes eye exam every year.  Please see your eye doctor.  Ask them to fax [place] a report of your exam   I sent a prescription to your pharmacy for your Zoster vaccines to help prevent Shingles.  The shot may cause a sore arm and mild flu like symptoms for a  few days.  You will need a second shot 2 months after your first.    Please always bring your medication bottles  Come back to see me in one month for pap and blood pressure check and shots    [name], [place]

## 2021-10-17 ENCOUNTER — Ambulatory Visit (INDEPENDENT_AMBULATORY_CARE_PROVIDER_SITE_OTHER): Payer: Self-pay | Admitting: Family Medicine

## 2021-10-17 VITALS — BP 136/88 | HR 74 | Wt 175.6 lb

## 2021-10-17 DIAGNOSIS — I10 Essential (primary) hypertension: Secondary | ICD-10-CM

## 2021-10-17 DIAGNOSIS — E119 Type 2 diabetes mellitus without complications: Secondary | ICD-10-CM

## 2021-10-17 LAB — POCT GLYCOSYLATED HEMOGLOBIN (HGB A1C): HbA1c, POC (controlled diabetic range): 8.7 % — AB (ref 0.0–7.0)

## 2021-10-17 MED ORDER — BD ASSURE BPM/AUTO ARM CUFF MISC
0 refills | Status: DC
Start: 2021-10-17 — End: 2023-02-03

## 2021-10-17 MED ORDER — ZOSTER VAC RECOMB ADJUVANTED 50 MCG/0.5ML IM SUSR: INTRAMUSCULAR | 1 refills | Status: DC

## 2021-10-17 MED ORDER — EMPAGLIFLOZIN 10 MG PO TABS: 10.0000 mg | ORAL_TABLET | Freq: Every day | ORAL | 3 refills | Status: DC

## 2021-10-17 NOTE — Assessment & Plan Note (Signed)
Controlled today.  Will continue to monitor.  Check labs next visit

## 2021-10-17 NOTE — Patient Instructions (Addendum)
Good to see you today - Thank you for coming in  Things we discussed today:  Diabetes Is not well controlled.   Keep taking metformin twice a day Start Jardiance 10 mg once a day.  You may notice you urinate a little more.  Do not take if you are dehydrated - vomiting or sweating a lot   Blood pressure Your goal blood pressure is less than 140/90.  Check your blood pressure several times a week.  If regularly higher than this please let me know - either with MyChart or leaving a phone message. Next visit please bring in your blood pressure cuff.     You need a Pap smear to prevent cervical cancer.  Please make an appointment.   You need an diabetes eye exam every year.  Please see your eye doctor.  Ask them to fax [place] a report of your exam   I sent a prescription to your pharmacy for your Zoster vaccines to help prevent Shingles.  The shot may cause a sore arm and mild flu like symptoms for a few days.  You will need a second shot 2 months after your first.    Please always bring your medication bottles  Come back to see me in one month for pap and blood pressure check and shots

## 2021-10-17 NOTE — Assessment & Plan Note (Signed)
Not at goal. Add Jardiance and urge twice a day metformin and better diet

## 2021-10-18 ENCOUNTER — Encounter: Payer: Self-pay | Admitting: Family Medicine

## 2021-11-12 ENCOUNTER — Encounter: Payer: Self-pay | Admitting: Family Medicine

## 2021-11-15 ENCOUNTER — Telehealth: Payer: Self-pay

## 2021-11-15 ENCOUNTER — Other Ambulatory Visit: Payer: Self-pay | Admitting: Family Medicine

## 2021-11-15 NOTE — Telephone Encounter (Signed)
Application for Jardiance '10mg'$  tablets for Patient Assistance   was mailed to pt home 11/16/2021   [facility] Patient Advocate

## 2021-11-16 ENCOUNTER — Other Ambulatory Visit (HOSPITAL_COMMUNITY): Payer: Self-pay

## 2021-11-19 ENCOUNTER — Ambulatory Visit: Payer: Medicaid Other | Admitting: Family Medicine

## 2021-11-19 NOTE — Progress Notes (Deleted)
    SUBJECTIVE:   CHIEF COMPLAINT / HPI:   Essential hypertension, benign Controlled today.  Will continue to monitor.  Check labs next visit   Diabetes mellitus type 2 without retinopathy Not at goal. Add Jardiance and urge twice a day metformin and better diet    PAP  PERTINENT  PMH / PSH: ***  OBJECTIVE:   LMP 07/08/2013   ***  ASSESSMENT/PLAN:   No problem-specific Assessment & Plan notes found for this encounter.     [name], MD [place]

## 2021-11-20 ENCOUNTER — Other Ambulatory Visit (HOSPITAL_COMMUNITY): Payer: Self-pay

## 2021-12-04 NOTE — Telephone Encounter (Signed)
74/08/14 Submitted application for JARDIANCE to [employer]) for patient assistance.   Phone: [phone]

## 2021-12-04 NOTE — Telephone Encounter (Signed)
Attempted to call patient regarding fax rec'd from [facility]. Company needs proof of income.   Pt voicemail full

## 2021-12-05 NOTE — Telephone Encounter (Signed)
Attempted to call patient 2nd time regarding patient assistance application.  Mailbox full

## 2021-12-17 ENCOUNTER — Other Ambulatory Visit: Payer: Self-pay | Admitting: Family Medicine

## 2022-01-01 ENCOUNTER — Ambulatory Visit (INDEPENDENT_AMBULATORY_CARE_PROVIDER_SITE_OTHER): Payer: Commercial Managed Care - HMO | Admitting: Family Medicine

## 2022-01-01 ENCOUNTER — Encounter: Payer: Self-pay | Admitting: Family Medicine

## 2022-01-01 ENCOUNTER — Other Ambulatory Visit: Payer: Self-pay

## 2022-01-01 ENCOUNTER — Other Ambulatory Visit (HOSPITAL_COMMUNITY)
Admission: RE | Admit: 2022-01-01 | Discharge: 2022-01-01 | Disposition: A | Discharge status: Home / Self Care | Payer: Commercial Managed Care - HMO | Source: Ambulatory Visit | Attending: Family Medicine | Admitting: Family Medicine

## 2022-01-01 VITALS — BP 157/95 | HR 83 | Wt 182.0 lb

## 2022-01-01 DIAGNOSIS — E119 Type 2 diabetes mellitus without complications: Secondary | ICD-10-CM

## 2022-01-01 DIAGNOSIS — Z124 Encounter for screening for malignant neoplasm of cervix: Secondary | ICD-10-CM

## 2022-01-01 DIAGNOSIS — M542 Cervicalgia: Secondary | ICD-10-CM | POA: Insufficient documentation

## 2022-01-01 DIAGNOSIS — R87610 Atypical squamous cells of undetermined significance on cytologic smear of cervix (ASC-US): Secondary | ICD-10-CM

## 2022-01-01 DIAGNOSIS — N898 Other specified noninflammatory disorders of vagina: Secondary | ICD-10-CM | POA: Diagnosis not present

## 2022-01-01 DIAGNOSIS — Z23 Encounter for immunization: Secondary | ICD-10-CM

## 2022-01-01 DIAGNOSIS — I10 Essential (primary) hypertension: Secondary | ICD-10-CM

## 2022-01-01 LAB — POCT WET PREP (WET MOUNT)
Clue Cells Wet Prep Whiff POC: NEGATIVE
Trichomonas Wet Prep HPF POC: ABSENT

## 2022-01-01 NOTE — Assessment & Plan Note (Signed)
Wet prep unremarkable.  No evidence of BV or yeast infection.  Discharge is likely physiologic.

## 2022-01-01 NOTE — Assessment & Plan Note (Addendum)
A1c was above goal at 8.7% on 9/13 and patient has been inconsistent with metformin adherence as well as unable to purchase Jardiance due to insurance difficulties.  She will message Dr. Erin Hearing after 12/1 when her new Medicaid plan starts to request approval for Jardiance then.  Addressed patient's concerns about taking metformin on an empty stomach and emphasized importance of medication adherence.  Patient will schedule diabetic eye exam next month with new insurance. - Urged patient to take metformin BID despite irregular diet; encouraged eating more regular meals, keeping them small if necessary - Reorder Jardiance with new insurance after 12/1

## 2022-01-01 NOTE — Progress Notes (Unsigned)
SUBJECTIVE:   CHIEF COMPLAINT / HPI:   Essential hypertension, benign Not controlled today. Took her blood pressure medication last night as usual.  Labs WNL 07/10/21.  Patient unable to measure BP at home as she has not gotten a home cuff yet.  When she tried to pick one up from [facility], she was told that they do not fill that prescription.  Of note, she is getting new insurance on 12/1 through [place].  Diabetes mellitus type 2 without retinopathy Patient unable to pick up Jardiance yet due to recent changes in insurance.  Will go to pick it up soon.  She also occasionally misses doses of her metformin.  When probed, she states that taking meds every day at the same time would help her with adherence.  Current obstacle to consistent adherence is that she wants to take meds with food, but has a very irregular diet.  Sometimes does not eat all day and often does not have much of an appetite.  Of note, she occasionally has moments when she is much hungrier and eats "a ton," then they go away.  Denies purging or binging behaviors.  Does not experience guilt surrounding eating and has no body image issues.  Weight is holding mostly steady.  Will try to schedule diabetic eye exam next month.  Neck pain and stiffness Patient reports right sided neck pain and feeling of "skin crawling" over her right shoulder for past 2-3 months. Pain was initially whole neck, but now has lateralized to right side only.  Maybe had some shooting pain initially, but not recently.  Onset was sudden one day, but patient does not recall any trauma or injury.  Pain has improved since onset and is not currently affecting her daily life significantly.  Mainly causes discomfort when she is at home relaxing.  Vaginal discharge Has had some thin white discharge for about a month, which is abnormal for her.  Did not finish full course of monostat before and thinks that may be the cause of  her symptoms.  Denies vaginal itching,  burning, and urinary urgency or dysuria.  No concern for STI as she is not currently sexually active.  PERTINENT  PMH / PSH: Essential HTN, obesity, T2DM    OBJECTIVE:   BP (!) 157/95   Pulse 83   Wt 182 lb (82.6 kg)   LMP 07/08/2013   SpO2 100%   BMI 32.24 kg/m    General: Sitting on exam table comfortably, no acute distress, alert and at baseline. Abdominal: No tenderness to deep or light palpation. No rebound or guarding. No HSM. Skin: Warm and dry. Extremities: No peripheral edema bilaterally. MSK: Full passive ROM of both shoulders and neck.  No tenderness over cervical spines.  No paraspinal tenderness or tenderness with palpation of neck muscles.  Negative empty can test and no evidence of shoulder impingement. Pelvic exam: No external lesions.  Minimal whitish thin discharge Cervix down and to L slightly friable with   POCT labs POCT Wet Prep [facility] [place])     Status: None   Collection Time: 01/01/22 11:50 AM  Result Value Ref Range   Source Wet Prep POC VAG    WBC, Wet Prep HPF POC 5-10    Bacteria Wet Prep HPF POC Few Few   Clue Cells Wet Prep HPF POC None None   Clue Cells Wet Prep Whiff POC Negative Whiff    Yeast Wet Prep HPF POC None None   KOH Wet Prep  POC None None   Trichomonas Wet Prep HPF POC Absent Absent      ASSESSMENT/PLAN:   Need for immunization against influenza -     Flu Vaccine QUAD 50moIM (Fluarix, Fluzone & Alfiuria Quad PF)  Screening for malignant neoplasm of cervix -     Cytology - PAP  Vaginal discharge Assessment & Plan: Wet prep unremarkable.  No evidence of BV or yeast infection.  Discharge is likely physiologic.  Orders: -     POCT Wet Prep ([place])  Diabetes mellitus type 2 without retinopathy ([place] Assessment & Plan: A1c was above goal at 8.7% on 9/13 and patient has been inconsistent with metformin adherence as well as unable to purchase Jardiance due to insurance difficulties.  She will message Dr. CErin Hearingafter 12/1  when her new Medicaid plan starts to request approval for Jardiance then.  Addressed patient's concerns about taking metformin on an empty stomach and emphasized importance of medication adherence.  Patient will schedule diabetic eye exam next month with new insurance. - Urged patient to take metformin BID despite irregular diet; encouraged eating more regular meals, keeping them small if necessary - Reorder Jardiance with new insurance after 12/1   Essential hypertension, benign Assessment & Plan: Blood pressure above goal today, but patient is not currently adherent with medication regimen due to insurance coverage difficulties.  Will continue to monitor and consider adding a second anti-hypertensive agent once patient is able to obtain Jardiance on new insurance after Dec 1st.  - Reorder patient's BP cuff after new insurance starts 12/1   Neck pain Assessment & Plan: Presentation is consistent with muscle strain and has improved over the past few months from onset.  No radiculopathy features and no concerning spinal tenderness.  Physical exam overall reassuring. - Continue conservative treatment with heating pads and stretching. - Patient will follow up if worsening or not improving in a few weeks.  Consider PT and/or imaging at that time.   Health maintenance - STD screening: Not sexually active - Immunizations: Influenza vaccine given today.  Will get Shingrix 2nd dose at pharmacy soon.  Dimitry SMining engineer MRich ____________________________________________________________ Patient Instructions  It was great to see you today! Thank you for choosing [facility] for your primary care.  Today we addressed: Diabetes: Please take your metformin at the same time every day, whether or not you are eating.  It should not cause you any trouble even without food.  We will also work on getting your JVania Reaauthorized with your new insurance  after December 1st.  Please message Dr. CErin Hearingwith a reminder about the Jardiance then.  Please also schedule your eye exam soon. Blood pressure: Your blood pressure is a bit high today, but we will wait to make any medication changes until you can get your Jardiance, which also helps with blood pressure.  We will see about getting your new insurance to cover a blood pressure cuff after December 1st.  Please message Dr. CErin Hearingon MyChart to remind him about ordering that. Neck pain: This is likely some muscle strain.  It seems to be improving, but we recommend using some heating pads and gently stretching your neck.  If the pain gets worse or does not improve more in a few weeks, please come back and we can do some more investigating. Vaginal discharge: We will message you with the results on MyChart.  You should return to our clinic in 3 months to follow  up about your diabetes and blood pressure.  Thank you for coming to see [place] at [place] and for the opportunity to care for you! Shitarev, Dimitry, Medical Student, Dr. Azzie Roup 01/01/2022, 11:50 AM  ____________________________________________________  Make sure to check out at the front desk before you leave today.  Please arrive at least 15 minutes prior to your scheduled appointments.  If you need additional refills before your next appointment, please call your pharmacy first.

## 2022-01-01 NOTE — Patient Instructions (Signed)
It was great to see you today! Thank you for choosing [facility] for your primary care.  Today we addressed: Diabetes: Please take your metformin at the same time every day, whether or not you are eating.  It should not cause you any trouble even without food.  We will also work on getting your Vania Rea authorized with your new insurance after December 1st.  Please message Dr. Erin Hearing with a reminder about the Jardiance then.  Please also schedule your eye exam soon. Blood pressure: Your blood pressure is a bit high today, but we will wait to make any medication changes until you can get your Jardiance, which also helps with blood pressure.  We will see about getting your new insurance to cover a blood pressure cuff after December 1st.  Please message Dr. Erin Hearing on MyChart to remind him about ordering that. Neck pain: This is likely some muscle strain.  It seems to be improving, but we recommend using some heating pads and gently stretching your neck.  If the pain gets worse or does not improve more in a few weeks, please come back and we can do some more investigating. Vaginal discharge: We will message you with the results on MyChart.  You should return to our clinic in 3 months to follow up about your diabetes and blood pressure.  Thank you for coming to see [place] at [place] and for the opportunity to care for you! Mikaele Stecher, Medical Student, Dr. Azzie Roup 01/01/2022, 11:50 AM  ____________________________________________________  Make sure to check out at the front desk before you leave today.  Please arrive at least 15 minutes prior to your scheduled appointments.  If you need additional refills before your next appointment, please call your pharmacy first.

## 2022-01-01 NOTE — Assessment & Plan Note (Addendum)
Blood pressure above goal today, but patient is not currently adherent with medication regimen due to insurance coverage difficulties.  Will continue to monitor and consider adding a second anti-hypertensive agent once patient is able to obtain Jardiance on new insurance after Dec 1st.  - Reorder patient's BP cuff after new insurance starts 12/1

## 2022-01-01 NOTE — Progress Notes (Deleted)
    SUBJECTIVE:   CHIEF COMPLAINT / HPI:   Essential hypertension, benign Controlled today.  Will continue to monitor.  Check labs next visit   Diabetes mellitus type 2 without retinopathy Not at goal. Add Jardiance and urge twice a day metformin and better diet     PERTINENT  PMH / PSH: *** Patient Active Problem List   Diagnosis Date Noted   Rupture of UCL of right thumb 06/12/2021   Pain of right thumb 04/10/2021   Hyperlipidemia 07/07/2014   Angioedema of lips 06/04/2013   Overweight and obesity(278.0) 06/30/2012   Diabetes mellitus type 2 without retinopathy ([place]) 11/10/2009   Anemia 11/10/2009   Bilateral chronic knee pain 11/10/2009   Essential hypertension, benign 10/06/2009    Current Outpatient Medications  Medication Instructions   amLODipine (NORVASC) 10 mg, Oral, Daily at bedtime   atorvastatin (LIPITOR) 40 mg, Oral, Daily   Blood Pressure Monitoring (B-D ASSURE BPM/AUTO ARM CUFF) MISC Take your blood pressure once a day   empagliflozin (JARDIANCE) 10 mg, Oral, Daily   metFORMIN (GLUCOPHAGE-XR) 750 mg, Oral, Daily with breakfast   Zoster Vaccine Adjuvanted [facility]) injection Inject 0.5 ml IM and Repeat in 2 months       10/17/2021   10:47 AM 10/17/2021   10:31 AM 07/11/2021    3:24 PM  Vitals with BMI  Weight  175 lbs 10 oz   Systolic 597 471 855  Diastolic 88 90 82  Pulse  74 87      OBJECTIVE:   LMP 07/08/2013   ***  ASSESSMENT/PLAN:   There are no diagnoses linked to this encounter.   There are no Patient Instructions on file for this visit.   [name], MD [place]

## 2022-01-01 NOTE — Assessment & Plan Note (Signed)
Presentation is consistent with muscle strain and has improved over the past few months from onset.  No radiculopathy features and no concerning spinal tenderness.  Physical exam overall reassuring. - Continue conservative treatment with heating pads and stretching. - Patient will follow up if worsening or not improving in a few weeks.  Consider PT and/or imaging at that time.

## 2022-01-04 ENCOUNTER — Encounter: Payer: Self-pay | Admitting: Family Medicine

## 2022-01-04 DIAGNOSIS — Z419 Encounter for procedure for purposes other than remedying health state, unspecified: Secondary | ICD-10-CM | POA: Diagnosis not present

## 2022-01-04 DIAGNOSIS — R87610 Atypical squamous cells of undetermined significance on cytologic smear of cervix (ASC-US): Secondary | ICD-10-CM | POA: Insufficient documentation

## 2022-01-04 DIAGNOSIS — E119 Type 2 diabetes mellitus without complications: Secondary | ICD-10-CM

## 2022-01-04 LAB — CYTOLOGY - PAP
Comment: NEGATIVE
Diagnosis: UNDETERMINED — AB
High risk HPV: NEGATIVE

## 2022-01-04 NOTE — Assessment & Plan Note (Signed)
Should repeat pap in 3 years 2026.

## 2022-01-08 MED ORDER — EMPAGLIFLOZIN 10 MG PO TABS: 10.0000 mg | ORAL_TABLET | Freq: Every day | ORAL | 3 refills | Status: DC

## 2022-01-14 ENCOUNTER — Telehealth: Payer: Self-pay

## 2022-01-14 ENCOUNTER — Other Ambulatory Visit (HOSPITAL_COMMUNITY): Payer: Self-pay

## 2022-01-14 NOTE — Telephone Encounter (Signed)
Prior Auth for patients medication JARDIANCE approved by [facility] MEDICAID from 01/14/22 to 01/14/23.  Will discontinue PAP application with [employer] since patient no longer is eligible due to having medicaid.  Patient notified. Should have $4 copay.

## 2022-01-14 NOTE — Telephone Encounter (Signed)
Shredding application.  Patient now has [facility] Medicaid.  PA for Jardiance approved.

## 2022-01-14 NOTE — Telephone Encounter (Signed)
A Prior Authorization was initiated for this patients JARDIANCE through CoverMyMeds.    (FOR [facility] MEDICAID)  Key: [id number]

## 2022-01-24 ENCOUNTER — Other Ambulatory Visit: Payer: Self-pay | Admitting: Family Medicine

## 2022-01-30 ENCOUNTER — Encounter: Payer: Self-pay | Admitting: Family Medicine

## 2022-02-04 DIAGNOSIS — Z419 Encounter for procedure for purposes other than remedying health state, unspecified: Secondary | ICD-10-CM | POA: Diagnosis not present

## 2022-03-01 ENCOUNTER — Encounter: Payer: Self-pay | Admitting: Family Medicine

## 2022-03-07 DIAGNOSIS — Z419 Encounter for procedure for purposes other than remedying health state, unspecified: Secondary | ICD-10-CM | POA: Diagnosis not present

## 2022-03-22 ENCOUNTER — Telehealth: Payer: Self-pay

## 2022-03-22 NOTE — Progress Notes (Signed)
   Care Guide Note  03/22/2022 Name: Janissa Kepford MRN: [id number] DOB: 06/21/1965  Referred by: [name], MD Reason for referral : Care Coordination (Outreach to schedule with pharm d )   Zarrah Teska is a 57 y.o. year old female who is a primary care patient of Chambliss, Jeb Levering, MD. Kahlina Ruckman was referred to the pharmacist for assistance related to DM.    Successful contact was made with the patient to discuss pharmacy services including being ready for the pharmacist to call at least 5 minutes before the scheduled appointment time, to have medication bottles and any blood sugar or blood pressure readings ready for review. The patient agreed to meet with the pharmacist via with the pharmacist via telephone visit on (date/time).  03/27/2022  Noreene Larsson, [place], [place] [address] Direct Dial: [phone] [email or web address]

## 2022-03-27 ENCOUNTER — Other Ambulatory Visit: Payer: Commercial Managed Care - HMO

## 2022-03-27 ENCOUNTER — Telehealth: Payer: Self-pay | Admitting: Pharmacist

## 2022-03-27 NOTE — Progress Notes (Unsigned)
Attempted to contact patient for scheduled appointment for medication management. Left HIPAA compliant message for patient to return my call at their convenience.   Catie Hedwig Morton, PharmD, [place], [facility] [phone]

## 2022-04-03 ENCOUNTER — Other Ambulatory Visit: Payer: Commercial Managed Care - HMO

## 2022-04-03 DIAGNOSIS — E119 Type 2 diabetes mellitus without complications: Secondary | ICD-10-CM

## 2022-04-03 MED ORDER — ACCU-CHEK SOFTCLIX LANCETS MISC: 12 refills | Status: AC

## 2022-04-03 MED ORDER — ACCU-CHEK GUIDE ME W/DEVICE KIT: PACK | 0 refills | Status: DC

## 2022-04-03 MED ORDER — ACCU-CHEK GUIDE VI STRP: ORAL_STRIP | 12 refills | Status: DC

## 2022-04-03 NOTE — Patient Instructions (Signed)
It was nice talking to you today!   Please schedule a follow up visit with Dr. Erin Hearing. I will call you in 6 weeks to see how things are going.   I've sent in a prescription for diabetic testing supplies.   Your goal blood sugar is 80-130 before eating and less than 180 after eating.  Monitor blood sugars at home and keep a log (glucometer or piece of paper) to bring with you to your next visit.  Keep up the good work with diet and exercise. Aim for a diet full of vegetables, fruit and lean meats (chicken, [place], fish). Try to limit salt intake by eating fresh or frozen vegetables (instead of canned), rinse canned vegetables prior to cooking and do not add any additional salt to meals.   Your blood pressure goal is <130/80 mmHg. You can purchase a blood pressure cuff over the counter for around $30.

## 2022-04-03 NOTE — Progress Notes (Signed)
04/03/2022 Name: Colleen Ford MRN: [id number] DOB: 1965/05/27  Chief Complaint  Patient presents with   Medication Management    Diabetes, hypertension    Colleen Ford is a 57 y.o. year old female who presented for a telephone visit.   They were referred to the pharmacist by their Case Management Team  for assistance in managing diabetes and hypertension.   Patient is participating in a Managed Medicaid Plan:  Yes  Subjective:  Care Team: Primary Care Provider: [name], MD ; Next Scheduled Visit: Not yet scheduled. Encouraged patient to schedule via MyChart  Medication Access/Adherence  Current Pharmacy:  [facility] ([place] [place] - [address] [place] ([place]) [place] [address] Phone: [phone] Fax: [phone]   Patient reports affordability concerns with their medications: No  Patient reports access/transportation concerns to their pharmacy: No  Patient reports adherence concerns with their medications:  No    Diabetes:  Current medications: metformin 1500 mg (takes two 750 mg tabs QAM, prescribed as 750 mg daily), Jardiance 10 mg daily  -States she is not interested in injectables, including GLP-1 RA  Current glucose readings: does not have current meter. Would like a new prescription.   Patient denies hypoglycemic s/sx including dizziness, shakiness, sweating. Patient denies hyperglycemic symptoms including polyuria, polydipsia, polyphagia, nocturia, neuropathy, blurred vision.  Current meal patterns:  - Breakfast: bacon, eggs on toast, grits, pancakes - Lunch & Supper: meat and veggie - Drinks water, diet Pepsi   Current physical activity: limited   Hypertension:  Current medications: amlodipine 10 mg daily  Patient does not have a validated, automated, upper arm home BP cuff. She is looking into getting one.   Patient denies hypotensive s/sx including dizziness,  lightheadedness.  Patient denies hypertensive symptoms including headache, chest pain, shortness of breath  Current meal patterns: tries to limit salt  Current physical activity: limited   Objective:  Lab Results  Component Value Date   HGBA1C 8.7 (A) 10/17/2021    Lab Results  Component Value Date   CREATININE 1.09 (H) 07/10/2021   BUN 10 07/10/2021   NA 139 07/10/2021   K 4.3 07/10/2021   CL 102 07/10/2021   CO2 27 07/10/2021    Lab Results  Component Value Date   CHOL 206 (H) 06/28/2020   HDL 91 06/28/2020   LDLCALC 101 (H) 06/28/2020   LDLDIRECT 108 (H) 01/26/2018   TRIG 78 06/28/2020   CHOLHDL 2.3 06/28/2020    Medications Reviewed Today     Reviewed by Pauletta Browns, [facility] (Pharmacist) on 04/03/22 at 1452  Med List Status: <None>   Medication Order Taking? Sig Documenting Provider Last Dose Status Informant  Accu-Chek Softclix Lancets lancets [id number] Yes Use as instructed [name], MD  Active   amLODipine (NORVASC) 10 MG tablet [id number] Yes TAKE 1 TABLET BY MOUTH AT BEDTIME [name], MD Taking Active   atorvastatin (LIPITOR) 40 MG tablet [id number] Yes Take 1 tablet (40 mg total) by mouth daily. [name], MD Taking Active   Blood Glucose Monitoring Suppl (ACCU-CHEK GUIDE ME) w/Device KIT [id number] Yes Use to check blood glucose three times daily. [name], MD  Active   Blood Pressure Monitoring (B-D ASSURE BPM/AUTO ARM CUFF) MISC [id number]  Take your blood pressure once a day  Patient not taking: Reported on 01/01/2022   [name], MD  Active            Med  Note Katy Fitch, [place] Jan 01, 2022  1:32 PM) Was told [facility] does not fill this prescription  empagliflozin (JARDIANCE) 10 MG TABS tablet [id number] Yes Take 1 tablet (10 mg total) by mouth daily. [name], MD Taking Active   glucose blood (ACCU-CHEK GUIDE) test strip :[id number] Yes Use as instructed [name], MD  Active   metFORMIN (GLUCOPHAGE-XR) 750 MG 24 hr tablet [id number] Yes Take 1 tablet by mouth once daily with breakfast  Patient taking differently: Take 1,500 mg by mouth daily with breakfast.   [name], MD Taking Active   Zoster Vaccine Adjuvanted [facility]) injection [id number]  Inject 0.5 ml IM and Repeat in 2 months [name], MD  Active               Assessment/Plan:   Diabetes: - Currently uncontrolled based on A1c (8.7%). - Reviewed long term cardiovascular and renal outcomes of uncontrolled blood sugar - Reviewed goal A1c, goal fasting, and goal 2 hour post prandial glucose - Recommend to continue current treatment. Patient states she is not interested in using injections, including GLP-1s.   - Recommend to check glucose daily. Send in prescription for testing supplies.      Hypertension: - Currently uncontrolled per last office reading. Patient denies history of white coat hypertension.  - Reviewed long term cardiovascular and renal outcomes of uncontrolled blood pressure - Reviewed appropriate blood pressure monitoring technique and reviewed goal blood pressure. Recommended to check home blood pressure and heart rate daily. - Recommend to continue current treatment. If BP elevated at follow up, may consider addition of ARB given concurrent diabetes.  Would also recommend obtaining UACR at follow up.     Follow Up Plan:  Pharmacist: 6 weeks PCP visit not scheduled. Requested patient schedule appointment and she states she will do so on [place], [place].D. PGY-2 Ambulatory Care Pharmacy Resident

## 2022-04-05 DIAGNOSIS — Z419 Encounter for procedure for purposes other than remedying health state, unspecified: Secondary | ICD-10-CM | POA: Diagnosis not present

## 2022-04-23 ENCOUNTER — Telehealth: Payer: Self-pay

## 2022-04-23 ENCOUNTER — Ambulatory Visit (INDEPENDENT_AMBULATORY_CARE_PROVIDER_SITE_OTHER): Payer: Medicaid Other

## 2022-04-23 DIAGNOSIS — Z111 Encounter for screening for respiratory tuberculosis: Secondary | ICD-10-CM

## 2022-04-23 LAB — TB SKIN TEST
Induration: 0 mm
TB Skin Test: NEGATIVE

## 2022-04-23 NOTE — Telephone Encounter (Signed)
Patient presents to nurse clinic with employment form.   Due to patient TB screening, she needed [place] lab drawn.   Form placed in provider box for completion.   Please return to RN team once completed.   Talbot Grumbling, RN

## 2022-04-23 NOTE — Progress Notes (Signed)
Patient presents to nurse clinic for TB screening. 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   Spoke with Dr. Gwendlyn Deutscher. Received verbal orders for lab.   Patient has employment form for provider to complete. Placed in PCP box.   Assisted patient to lab. Advised that we would call her with results and once paperwork has been completed.   Talbot Grumbling, RN

## 2022-04-24 ENCOUNTER — Other Ambulatory Visit: Payer: Self-pay | Admitting: Family Medicine

## 2022-04-24 DIAGNOSIS — E7800 Pure hypercholesterolemia, unspecified: Secondary | ICD-10-CM

## 2022-04-25 ENCOUNTER — Ambulatory Visit: Payer: Medicaid Other

## 2022-04-26 ENCOUNTER — Encounter: Payer: Self-pay | Admitting: Family Medicine

## 2022-04-26 ENCOUNTER — Encounter: Payer: Medicaid Other | Admitting: Pharmacist

## 2022-04-30 ENCOUNTER — Other Ambulatory Visit: Payer: Self-pay | Admitting: Family Medicine

## 2022-04-30 ENCOUNTER — Encounter: Payer: Self-pay | Admitting: Family Medicine

## 2022-04-30 ENCOUNTER — Ambulatory Visit (INDEPENDENT_AMBULATORY_CARE_PROVIDER_SITE_OTHER): Payer: Medicaid Other | Admitting: Family Medicine

## 2022-04-30 VITALS — BP 108/64 | HR 75 | Wt 178.0 lb

## 2022-04-30 DIAGNOSIS — I10 Essential (primary) hypertension: Secondary | ICD-10-CM | POA: Diagnosis not present

## 2022-04-30 DIAGNOSIS — E119 Type 2 diabetes mellitus without complications: Secondary | ICD-10-CM

## 2022-04-30 LAB — POCT GLYCOSYLATED HEMOGLOBIN (HGB A1C): HbA1c, POC (controlled diabetic range): 9.1 % — AB (ref 0.0–7.0)

## 2022-04-30 MED ORDER — EMPAGLIFLOZIN 25 MG PO TABS: 25.0000 mg | ORAL_TABLET | Freq: Every day | ORAL | 0 refills | Status: DC

## 2022-04-30 NOTE — Assessment & Plan Note (Signed)
Well controlled continue current medications  

## 2022-04-30 NOTE — Patient Instructions (Addendum)
Good to see you today - Thank you for coming in  Things we discussed today:  Diabetes - I sent in a new prescription for Jardiance Take two of your 10 mg tabs until these are gone Then take the 25 mg tab once a day - Work on diet exercise and weight loss - Check fasting blood sugar - first thing when wake up before you eat Should be less than 150 most of the time - if in one month is not then call me   Dietician Call Dr Jenne Campus  [phone] to schedule an appointment  You need an diabetes eye exam every year.  Please see your eye doctor.  Ask them to fax [place] a report of your exam    Please always bring your medication bottles  Come back to see me in 3 months

## 2022-04-30 NOTE — Assessment & Plan Note (Signed)
Not at goal.  Increase Jardiance to 25 mg daily.   Continue metformin twice a day.  Discussed diet.  Discouraged limited 8 hour eating only.  She would like to see a dietician

## 2022-04-30 NOTE — Progress Notes (Signed)
    SUBJECTIVE:   CHIEF COMPLAINT / HPI:   Diabetes Meter readings - No low blood sugar.  Checks randomly.  No consistent fasting readings Out of jardiance for a few days  Hypertension Taking amlodipine daily.  No problems  Employment Screen  Work in day care.  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  No murmurs, gallops or rubs.    Lungs:  Normal respiratory effort, chest expands symmetrically. Lungs are clear to auscultation, no crackles or wheezes. Extremities:  No cyanosis, edema, or deformity noted with good range of motion of all major joints.   Mobility:able to get up and down from exam table without assistance or distress   ASSESSMENT/PLAN:   Diabetes mellitus type 2 without retinopathy ([place]) Assessment & Plan: Not at goal.  Increase Jardiance to 25 mg daily.   Continue metformin twice a day.  Discussed diet.  Discouraged limited 8 hour eating only.  She would like to see a dietician   Orders: -     POCT glycosylated hemoglobin (Hb A1C) -     Empagliflozin; Take 1 tablet (25 mg total) by mouth daily.  Dispense: 90 tablet; Refill: 0 -     Amb ref to Medical Nutrition Therapy-MNT  Essential hypertension, benign Assessment & Plan: Well controlled continue current medications       Patient Instructions  Good to see you today - Thank you for coming in  Things we discussed today:  Diabetes - I sent in a new prescription for Jardiance Take two of your 10 mg tabs until these are gone Then take the 25 mg tab once a day - Work on diet exercise and weight loss - Check fasting blood sugar - first thing when wake up before you eat Should be less than 150 most of the time - if in one month is not then call me   Dietician Call Dr Jenne Campus  [phone] to schedule an appointment  You need an diabetes eye exam every year.   Please see your eye doctor.  Ask them to fax [place] a report of your exam    Please always bring your medication bottles  Come back to see me in 3 months    [name], [place]

## 2022-05-01 LAB — QUANTIFERON-TB GOLD PLUS
QuantiFERON Mitogen Value: >10 IU/mL
QuantiFERON Nil Value: 0.1 IU/mL
QuantiFERON TB1 Ag Value: 0.23 IU/mL
QuantiFERON TB2 Ag Value: 0.34 IU/mL
QuantiFERON-TB Gold Plus: NEGATIVE

## 2022-05-03 ENCOUNTER — Ambulatory Visit
Admission: RE | Admit: 2022-05-03 | Discharge: 2022-05-03 | Disposition: A | Discharge status: Home / Self Care | Payer: Medicaid Other | Source: Ambulatory Visit | Attending: Family Medicine | Admitting: Family Medicine

## 2022-05-03 DIAGNOSIS — Z139 Encounter for screening, unspecified: Secondary | ICD-10-CM

## 2022-05-03 DIAGNOSIS — Z1231 Encounter for screening mammogram for malignant neoplasm of breast: Secondary | ICD-10-CM | POA: Diagnosis not present

## 2022-05-06 DIAGNOSIS — Z419 Encounter for procedure for purposes other than remedying health state, unspecified: Secondary | ICD-10-CM | POA: Diagnosis not present

## 2022-05-06 NOTE — Progress Notes (Unsigned)
Medical Nutrition Therapy Appt start time: 1000 end time: 1100 (1 hour) Primary concerns today: Blood sugar control.  PCP Azzie Roup, MD   Relevant history/background: Colleen Ford was referred by her PCP Azzie Roup, MD for MNT related to DM2.  Other diagnoses include HLD, HTN, and obesity.   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  Lives with two grown sons for whom she does most cooking.    Assessment:  Colleen Ford was given advice to "work on diet, exercise, and weight loss," which she interprets as meaning: "don't eat, get active."  She said she has attempted diet and exercise changes, but has not sustained them.  She tried eating a low-CHO diet, eating earlier dinner, intermittent fasting, which included only one meal a day.   AT FOLLOW-UP, PLAN TO DISCUSS PHYSICAL ACTIVITY (AND DOCUMENTING PROGRESS ON GOALS).    Learning Readiness: Ready  Usual eating pattern: 3 meals and 2-3 snacks per day.  Eats during the night ~3 X wk, e.g., nuts, ice crm sandwich.   Weight: 180.2 lb.  (Ht is 63".) Frequent foods and beverages: water, diet sparkling water, diet Pepsi; chx, hamburger meat, fish, veg's, chia seeds.     Avoided foods: dairy (dislikes milk, chs), mushrooms.   FBG:  149 yesterday (checks randomly, usually 3 X wk).   Usual physical activity: None currently.  Hopes to start walking with better weather.   Sleep: Estimates ~6 hrs/night.    24-hr recall: (Up at 6 AM; drank water) B ( AM)-   -- Snk ( AM)-  water  L (12 PM)-  1 c beef stew, 1/4 c rice, water Snk ( PM)-  --- D (9 PM)-  4 slc homemade pizza (chx, spinach, sauce), water Snk ( PM)-  1 ice crm sandwich, water Typical day? Yes.     Nutritional Diagnosis:  NB-1.1 Food and nutrition-related knowledge deficit As related to weight and blood glucose management.  As evidenced by erratic eating schedule that includes long  periods of not eating during the day as well as night eating ~2 X week.  Handouts given during visit include: After-Visit Summary (AVS)  Demonstrated degree of understanding via:  Teach Back  Barriers to learning/adherence to lifestyle change: Longstanding dietary habits.  Monitoring/Evaluation:  Dietary intake, exercise, FBG, and body weight in 4 week(s).

## 2022-05-07 ENCOUNTER — Ambulatory Visit (INDEPENDENT_AMBULATORY_CARE_PROVIDER_SITE_OTHER): Payer: Medicaid Other | Admitting: Family Medicine

## 2022-05-07 DIAGNOSIS — E119 Type 2 diabetes mellitus without complications: Secondary | ICD-10-CM | POA: Diagnosis not present

## 2022-05-07 DIAGNOSIS — E663 Overweight: Secondary | ICD-10-CM | POA: Diagnosis not present

## 2022-05-07 NOTE — Patient Instructions (Addendum)
-   TASTE PREFERENCES ARE LEARNED.  This means it will get easier to choose foods you know are good for you if you are exposed to them enough.   - Check your fasting blood sugars at least 3 X wk.  Your target is no higher than 150.   - Intermittent fasting is ok, but restrict your eating to no fewer than 10-12 hours during the day, AND it is best to eat all of your food during daylight hours, NOT at night.     Diet Recommendations for Diabetes  Carbohydrate includes starch, sugar, and fiber.  Of these, only sugar and starch raise blood glucose.  (Fiber is found in fruits, vegetables [especially skin, seeds, and stalks], whole grains, and beans.)   Starchy (carb) foods: Bread, rice, pasta, potatoes, corn, cereal, grits, crackers, bagels, muffins, all baked goods.  (Fruit, milk, and yogurt also have carbohydrate, but most of these foods will not spike your blood sugar as most starchy or sweet foods will.)  A few fruits do cause high blood sugars; use small portions of bananas (limit to 1/2 at a time), grapes, watermelon, and oranges.   Protein foods: Meat, fish, poultry, eggs, dairy foods, and beans such as pinto and kidney beans (beans also provide carbohydrate).   1. Eat at least 3 REAL meals and 1-2 snacks per day.  Eat breakfast within one hour of getting up.  Have something to eat at least every 5 hours while awake.  - A REAL meal for lunch or dinner includes at least some protein, some starch, and vegetables and/or fruit.   - A REAL breakfast needs to include both starch and protein foods.     2. Limit starchy foods to TWO portions per meal and ONE per snack. ONE portion of a starchy food is equal to the following:  - ONE slice of bread (or its equivalent, such as half of a hamburger bun).  - 1/2 cup of a "scoopable" starchy food such as potatoes or rice.  - 15 grams of Total Carbohydrate as shown on food label.  - 4 ounces of a sweet drink (including fruit juice).  3. Include twice the volume  of vegetables as protein or carbohydrate foods in at least 5 meals per week.  - Fresh or frozen vegetables are best.  - Keep frozen vegetables on hand for a quick option.     Follow up nutrition appt on Tuesday, April 30 at 1:30 PM.  (If you need to change your appt, call Dr. Jenne Campus at [phone].)

## 2022-05-07 NOTE — Telephone Encounter (Signed)
Form placed up front for pick up.  Copy made for scanning.   Patient aware.

## 2022-05-15 ENCOUNTER — Other Ambulatory Visit: Payer: Medicaid Other

## 2022-05-15 ENCOUNTER — Telehealth: Payer: Self-pay

## 2022-05-15 NOTE — Progress Notes (Signed)
Attempted to contact patient for scheduled appointment for medication management. Left HIPAA compliant message for patient to return my call at their convenience.    Cosme Jacob, PharmD PGY2 Ambulatory Care Pharmacy Resident  

## 2022-05-20 ENCOUNTER — Other Ambulatory Visit: Payer: Self-pay | Admitting: Family Medicine

## 2022-05-26 ENCOUNTER — Other Ambulatory Visit: Payer: Self-pay | Admitting: Family Medicine

## 2022-05-27 MED ORDER — METFORMIN HCL ER 750 MG PO TB24: 750.0000 mg | ORAL_TABLET | Freq: Every day | ORAL | 0 refills | Status: DC

## 2022-05-31 ENCOUNTER — Other Ambulatory Visit: Payer: Self-pay | Admitting: Family Medicine

## 2022-05-31 DIAGNOSIS — E119 Type 2 diabetes mellitus without complications: Secondary | ICD-10-CM

## 2022-06-04 ENCOUNTER — Encounter: Payer: Self-pay | Admitting: Family Medicine

## 2022-06-04 ENCOUNTER — Ambulatory Visit (INDEPENDENT_AMBULATORY_CARE_PROVIDER_SITE_OTHER): Payer: Medicaid Other | Admitting: Family Medicine

## 2022-06-04 VITALS — Ht 63.0 in | Wt 175.8 lb

## 2022-06-04 DIAGNOSIS — E119 Type 2 diabetes mellitus without complications: Secondary | ICD-10-CM

## 2022-06-04 DIAGNOSIS — E663 Overweight: Secondary | ICD-10-CM

## 2022-06-04 NOTE — Patient Instructions (Signed)
-   When you refill your metformin, be sure to confirm that the prescription is for 2 X day.  Also ask about the blood pressure cuff.    - Possibly the MOST important principles for controlling blood sugar (and body weight) is to eat with regularity.  If you let yourself get over-hungry, you will probably overeat!  - Consider scheduling with Dr. Raymondo Band at our office to discuss using a continuous glucose monitor (CGM).    Behavioral Goals:  1. Eat at least 3 REAL meals and 1-2 snacks per day.  Eat breakfast within one hour of getting up.  Have something to eat at least every 5 hours while awake.  - A REAL meal for lunch or dinner includes at least some protein, some starch, and vegetables and/or fruit.   - A REAL breakfast needs to include both starch and protein foods.   2. Limit starchy foods to TWO portions per meal and ONE per snack. ONE portion of a starchy food is equal to the following:  - ONE slice of bread (or its equivalent, such as half of a hamburger bun).  - 1/2 cup of a "scoopable" starchy food such as potatoes or rice.  - 15 grams of Total Carbohydrate as shown on food label.  - 4 ounces of a sweet drink (including fruit juice). 3. Include vegetables in at least 10 meals per week.  - Fresh or frozen vegetables are best.  - Keep frozen vegetables on hand for a quick option.     Document progress on your goals using the Goals Sheet provided today.     Follow up nutrition appt on TBA.  Call Dr. Gerilyn Pilgrim to schedule your appt: [phone].

## 2022-06-04 NOTE — Progress Notes (Signed)
Medical Nutrition Therapy Appt start time: 1330 end time: 1430 (1 hour) Primary concerns today: Blood sugar control.  PCP Oda Cogan, MD   Relevant history/background: Colleen Ford was referred by her PCP Oda Cogan, MD for MNT related to DM2.  Other diagnoses include HLD, HTN, and obesity.   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  Lives with two grown sons for whom she does most cooking.    Assessment:  [name] has started working 40-50 hrs per week at her previous job at [employer].  Work schedule will change monthly, e.g., starts anywhere from 6 AM to 9 AM, and sometimes on Saturdays.  She usually has a cup of coffee and 4 oz Activia yogurt with Grapenuts after she gets to work.  Also brings a sandwich for lunch, but often does not eat lunch b/c of not being hungry.  Ms Ford said she only eats when hungry.  Yesterday, she ate the usual <500 kcal between breakfast and lunch, then ate >120 g of carbohydrate for [facility].  Usually skips breakfast on non-work days (grits, eggs, bacon/sausage).  We did not discuss physical activity today, as planned b/c of Colleen Ford's adjusting to her work hours.  Spent time instead explaining importance of eating regularly for best BG and weight management.    Learning Readiness: Ready  Usual eating pattern: 2-3 meals and 1 snack per day.  Eats during the night ~3 X wk, e.g., nuts, ice crm sandwich.   Weight: 175.8 lb.  (180.2 lb on 05/07/22; ht is 63".)   FBG:  Has not been able to check b/c she misplaced her strips.  Will get more early May.   Usual physical activity: None currently.    Sleep: Estimates 6(+) hrs/night.    24-hr recall:  (Up at 5 AM) Snk (7 AM)-  1 c coffee, 1 tbsp creamer, 1 1/2 tsp sugar B (10 AM)-  4 oz Activia yogurt, 1/4 c Grapenuts (takes meds at 10:30) Snk ( AM)-  --- L (12:30 PM)-  1 baloney sandwich, 1/4 tsp  mayo, Gatorade Zero, water Snk ( PM)-  --- D (5 PM)-  Dione Plover combo (1 crunchwrap supreme (73 g CHO), blk beef burrito (51)), 1/2 c fries, Gatorade Zero, water Snk (8 PM)-  1 small brownie, water Typical day? Yes.   Except that dinner is now usually home-prepared, and includes a protein, veg, and starch.    Nutritional Diagnosis: Little progress on NB-1.1 Food and nutrition-related knowledge deficit As related to weight and blood glucose management.  As evidenced by continued erratic eating schedule with long periods of not eating during the day.  Handouts given during visit include: After-Visit Summary (AVS) Goals Sheet  Demonstrated degree of understanding via:  Teach Back  Barriers to learning/adherence to lifestyle change: Longstanding dietary habits.  Monitoring/Evaluation:  Dietary intake, exercise, FBG, and body weight prn.

## 2022-06-05 DIAGNOSIS — Z419 Encounter for procedure for purposes other than remedying health state, unspecified: Secondary | ICD-10-CM | POA: Diagnosis not present

## 2022-06-06 ENCOUNTER — Other Ambulatory Visit: Payer: Self-pay | Admitting: Family Medicine

## 2022-06-06 MED ORDER — METFORMIN HCL ER 750 MG PO TB24
750.0000 mg | ORAL_TABLET | Freq: Two times a day (BID) | ORAL | 1 refills | Status: DC
Start: 2022-06-06 — End: 2022-12-09

## 2022-06-08 ENCOUNTER — Other Ambulatory Visit: Payer: Self-pay | Admitting: Family Medicine

## 2022-06-10 MED ORDER — AMLODIPINE BESYLATE 10 MG PO TABS
10.0000 mg | ORAL_TABLET | Freq: Every day | ORAL | 0 refills | Status: DC
Start: 2022-06-10 — End: 2022-08-12

## 2022-06-17 ENCOUNTER — Other Ambulatory Visit: Payer: Self-pay | Admitting: Family Medicine

## 2022-06-17 DIAGNOSIS — E119 Type 2 diabetes mellitus without complications: Secondary | ICD-10-CM

## 2022-06-17 MED ORDER — EMPAGLIFLOZIN 25 MG PO TABS: 25.0000 mg | ORAL_TABLET | Freq: Every day | ORAL | 0 refills | Status: DC

## 2022-07-06 DIAGNOSIS — Z419 Encounter for procedure for purposes other than remedying health state, unspecified: Secondary | ICD-10-CM | POA: Diagnosis not present

## 2022-07-19 ENCOUNTER — Other Ambulatory Visit: Payer: Self-pay | Admitting: Family Medicine

## 2022-07-19 DIAGNOSIS — E7800 Pure hypercholesterolemia, unspecified: Secondary | ICD-10-CM

## 2022-07-23 ENCOUNTER — Other Ambulatory Visit: Payer: Self-pay

## 2022-07-23 ENCOUNTER — Encounter: Payer: Self-pay | Admitting: Family Medicine

## 2022-07-23 ENCOUNTER — Ambulatory Visit (INDEPENDENT_AMBULATORY_CARE_PROVIDER_SITE_OTHER): Payer: Medicaid Other | Admitting: Family Medicine

## 2022-07-23 VITALS — BP 120/79 | HR 70 | Ht 63.0 in | Wt 171.8 lb

## 2022-07-23 DIAGNOSIS — I10 Essential (primary) hypertension: Secondary | ICD-10-CM

## 2022-07-23 DIAGNOSIS — E119 Type 2 diabetes mellitus without complications: Secondary | ICD-10-CM

## 2022-07-23 DIAGNOSIS — E7800 Pure hypercholesterolemia, unspecified: Secondary | ICD-10-CM

## 2022-07-23 LAB — POCT GLYCOSYLATED HEMOGLOBIN (HGB A1C): HbA1c, POC (controlled diabetic range): 8 % — AB (ref 0.0–7.0)

## 2022-07-23 NOTE — Assessment & Plan Note (Signed)
Check labs today.  Continue atorvastatin 40 mg

## 2022-07-23 NOTE — Assessment & Plan Note (Signed)
Improving control.  Continue metformin and jardiance 25 mg daily

## 2022-07-23 NOTE — Progress Notes (Signed)
    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  No problems    OBJECTIVE:   BP 120/79   Pulse 70   Ht 5\' 3"  (1.6 m)   Wt 171 lb 12.8 oz (77.9 kg)   LMP 07/08/2013   SpO2 99%   BMI 30.43 kg/m   Diabetic Foot Check -  Appearance - no lesions, ulcers or calluses Skin - no unusual pallor or redness Sensation - intact    ASSESSMENT/PLAN:   Essential hypertension, benign Assessment & Plan: At goal.  Continue amlodipine 10 mg   Orders: -     Basic metabolic panel  Pure hypercholesterolemia Assessment & Plan: Check labs today.  Continue atorvastatin 40 mg   Orders: -     Lipid panel  Diabetes mellitus type 2 without retinopathy (HCC) Assessment & Plan: Improving control.  Continue metformin and jardiance 25 mg daily   Orders: -     POCT glycosylated hemoglobin (Hb A1C) -     Microalbumin / creatinine urine ratio     Patient Instructions  Good to see you today - Thank you for coming in  Things we discussed today:  Diabetes Doing better Keep with your diet  I will call you if your tests are not good.  Otherwise, I will send you a message on MyChart (if it is active) or a letter in the mail..  If you do not hear from me with in 2 weeks please call our office.     You need an diabetes eye exam every year.  Please see your eye doctor.  Ask them to fax [place] a report of your exam  [facility] medicaid   Please always bring your medication bottles  Come back to see me in 3 months    Carney Living, MD [facility]

## 2022-07-23 NOTE — Assessment & Plan Note (Signed)
At goal. Continue amlodipine 10 mg.

## 2022-07-23 NOTE — Patient Instructions (Signed)
Good to see you today - Thank you for coming in  Things we discussed today:  Diabetes Doing better Keep with your diet  I will call you if your tests are not good.  Otherwise, I will send you a message on MyChart (if it is active) or a letter in the mail..  If you do not hear from me with in 2 weeks please call our office.     You need an diabetes eye exam every year.  Please see your eye doctor.  Ask them to fax [place] a report of your exam  [facility] accept medicaid   Please always bring your medication bottles  Come back to see me in 3 months

## 2022-07-24 LAB — LIPID PANEL
Chol/HDL Ratio: 1.7 ratio (ref 0.0–4.4)
Cholesterol, Total: 169 mg/dL (ref 100–199)
HDL: 97 mg/dL (ref >39)
LDL Chol Calc (NIH): 60 mg/dL (ref 0–99)
Triglycerides: 63 mg/dL (ref 0–149)
VLDL Cholesterol Cal: 12 mg/dL (ref 5–40)

## 2022-07-24 LAB — BASIC METABOLIC PANEL
BUN/Creatinine Ratio: 19 (ref 9–23)
BUN: 12 mg/dL (ref 6–24)
CO2: 25 mmol/L (ref 20–29)
Calcium: 9.5 mg/dL (ref 8.7–10.2)
Chloride: 101 mmol/L (ref 96–106)
Creatinine, Ser: 0.63 mg/dL (ref 0.57–1.00)
Glucose: 125 mg/dL — ABNORMAL HIGH (ref 70–99)
Potassium: 4.4 mmol/L (ref 3.5–5.2)
Sodium: 142 mmol/L (ref 134–144)
eGFR: 104 mL/min/{1.73_m2} (ref >59)

## 2022-07-24 LAB — MICROALBUMIN / CREATININE URINE RATIO
Creatinine, Urine: 34.2 mg/dL
Microalb/Creat Ratio: 10 mg/g creat (ref 0–29)
Microalbumin, Urine: 3.3 ug/mL

## 2022-07-31 ENCOUNTER — Encounter: Payer: Self-pay | Admitting: Family Medicine

## 2022-08-05 DIAGNOSIS — Z419 Encounter for procedure for purposes other than remedying health state, unspecified: Secondary | ICD-10-CM | POA: Diagnosis not present

## 2022-08-10 ENCOUNTER — Other Ambulatory Visit: Payer: Self-pay | Admitting: Family Medicine

## 2022-09-05 DIAGNOSIS — Z419 Encounter for procedure for purposes other than remedying health state, unspecified: Secondary | ICD-10-CM | POA: Diagnosis not present

## 2022-09-13 ENCOUNTER — Ambulatory Visit (INDEPENDENT_AMBULATORY_CARE_PROVIDER_SITE_OTHER): Payer: Medicaid Other

## 2022-09-13 ENCOUNTER — Ambulatory Visit (INDEPENDENT_AMBULATORY_CARE_PROVIDER_SITE_OTHER): Payer: Medicaid Other | Admitting: Podiatry

## 2022-09-13 DIAGNOSIS — M7751 Other enthesopathy of right foot: Secondary | ICD-10-CM

## 2022-09-13 DIAGNOSIS — M76821 Posterior tibial tendinitis, right leg: Secondary | ICD-10-CM

## 2022-09-13 DIAGNOSIS — M21962 Unspecified acquired deformity of left lower leg: Secondary | ICD-10-CM | POA: Diagnosis not present

## 2022-09-13 DIAGNOSIS — M21961 Unspecified acquired deformity of right lower leg: Secondary | ICD-10-CM

## 2022-09-13 NOTE — Progress Notes (Signed)
Subjective:  Patient ID: Colleen Ford, female    DOB: 1965-03-10,  MRN: [id number]  No chief complaint on file.   57 y.o. female presents with the above complaint.  Patient presents with right medial foot pain.  Patient states that this has been acting up.  She states the left side is doing good but the right side is causing her some issues.  She wanted to discuss treatment options for this denies any other acute complaints.  She has not placed herself in a boot.  She is a diabetic   Review of Systems: Negative except as noted in the HPI. Denies N/V/F/Ch.  Past Medical History:  Diagnosis Date   Anemia    Arthritis    both knees   Depression    Diabetes mellitus without complication (HCC)    GERD (gastroesophageal reflux disease)    Hyperlipidemia    Hypertension    Pain of right thumb 04/10/2021   Rupture of UCL of right thumb 06/12/2021    Current Outpatient Medications:    Accu-Chek Softclix Lancets lancets, Use as instructed, Disp: 100 each, Rfl: 12   amLODipine (NORVASC) 10 MG tablet, TAKE 1 TABLET BY MOUTH AT BEDTIME, Disp: 90 tablet, Rfl: 1   atorvastatin (LIPITOR) 40 MG tablet, Take 1 tablet by mouth once daily, Disp: 90 tablet, Rfl: 3   Blood Glucose Monitoring Suppl (ACCU-CHEK GUIDE ME) w/Device KIT, USE AS DIRECTED TO  CHECK  FASTING  BLOOD  GLUCOSE  FIRST  THING  IN  THE  MORNING, Disp: 1 kit, Rfl: 0   Blood Pressure Monitoring (B-D ASSURE BPM/AUTO ARM CUFF) MISC, Take your blood pressure once a day (Patient not taking: Reported on 01/01/2022), Disp: 1 each, Rfl: 0   empagliflozin (JARDIANCE) 25 MG TABS tablet, Take 1 tablet (25 mg total) by mouth daily., Disp: 90 tablet, Rfl: 0   glucose blood (ACCU-CHEK GUIDE) test strip, Use as instructed, Disp: 100 each, Rfl: 12   metFORMIN (GLUCOPHAGE-XR) 750 MG 24 hr tablet, Take 1 tablet (750 mg total) by mouth in the morning and at bedtime., Disp: 180 tablet, Rfl: 1  Social History   Tobacco Use  Smoking Status Former    Current packs/day: 0.00   Types: Cigarettes   Quit date: 03/02/1995   Years since quitting: 27.5   Passive exposure: Past  Smokeless Tobacco Never    Allergies  Allergen Reactions   Lisinopril Swelling   Objective:  There were no vitals filed for this visit. There is no height or weight on file to calculate BMI. Constitutional Well developed. Well nourished.  Vascular Dorsalis pedis pulses palpable bilaterally. Posterior tibial pulses palpable bilaterally. Capillary refill normal to all digits.  No cyanosis or clubbing noted. Pedal hair growth normal.  Neurologic Normal speech. Oriented to person, place, and time. Epicritic sensation to light touch grossly present bilaterally.  Dermatologic Nails well groomed and normal in appearance. No open wounds. No skin lesions.  Orthopedic: Pain across the right medial foot pain along the course of the posterior tibial tendon no pain at the peroneal tendon at the Achilles tendon or ATFL ligament.  Pain with resisted inversion plantarflexion of the foot.   Radiographs: None Assessment:   1. Posterior tibial tendinitis of right leg   2. Deformity of both feet    Plan:  Patient was evaluated and treated and all questions answered.  Right posterior tibial tendinitis with underlying pes planovalgus deformity -All questions and concerns were discussed with the patient in extensive detail.  Given the amount of pain that she is having she will benefit from cam boot immobilization.  Patient has a cam boot at home she will place herself in it.  I will see her back again in 4 weeks if there is no improvement we will discuss steroid injection versus MRI  No follow-ups on file.

## 2022-09-17 ENCOUNTER — Telehealth: Payer: Self-pay | Admitting: Podiatry

## 2022-09-17 NOTE — Telephone Encounter (Signed)
Pt called and was sent home from work due to wearing the boot that was not closed toe. She is asking for a note to go back to work tomorrow. Does she have any restrictions? Please advise   Pt may have brace that was given to her last time that she can wear with her shoe.

## 2022-09-19 ENCOUNTER — Encounter: Payer: Self-pay | Admitting: Podiatry

## 2022-10-11 ENCOUNTER — Ambulatory Visit: Payer: Medicaid Other | Admitting: Podiatry

## 2022-10-23 ENCOUNTER — Ambulatory Visit: Payer: Medicaid Other | Admitting: Podiatry

## 2022-10-23 DIAGNOSIS — M76821 Posterior tibial tendinitis, right leg: Secondary | ICD-10-CM | POA: Diagnosis not present

## 2022-10-23 NOTE — Progress Notes (Signed)
Subjective:  Patient ID: Colleen Ford, female    DOB: December 12, 1965,  MRN: [id number]  Chief Complaint  Patient presents with   Foot Pain    57 y.o. female presents with the above complaint.  Patient presents for follow-up of right posterior tibial tendinitis.  She states the cam boot has helped considerably.  She would like to discuss next treatment plan.  She still has some residual pain  Review of Systems: Negative except as noted in the HPI. Denies N/V/F/Ch.  Past Medical History:  Diagnosis Date   Anemia    Arthritis    both knees   Depression    Diabetes mellitus without complication (HCC)    GERD (gastroesophageal reflux disease)    Hyperlipidemia    Hypertension    Pain of right thumb 04/10/2021   Rupture of UCL of right thumb 06/12/2021    Current Outpatient Medications:    Accu-Chek Softclix Lancets lancets, Use as instructed, Disp: 100 each, Rfl: 12   amLODipine (NORVASC) 10 MG tablet, TAKE 1 TABLET BY MOUTH AT BEDTIME, Disp: 90 tablet, Rfl: 1   atorvastatin (LIPITOR) 40 MG tablet, Take 1 tablet by mouth once daily, Disp: 90 tablet, Rfl: 3   Blood Glucose Monitoring Suppl (ACCU-CHEK GUIDE ME) w/Device KIT, USE AS DIRECTED TO  CHECK  FASTING  BLOOD  GLUCOSE  FIRST  THING  IN  THE  MORNING, Disp: 1 kit, Rfl: 0   Blood Pressure Monitoring (B-D ASSURE BPM/AUTO ARM CUFF) MISC, Take your blood pressure once a day (Patient not taking: Reported on 01/01/2022), Disp: 1 each, Rfl: 0   empagliflozin (JARDIANCE) 25 MG TABS tablet, Take 1 tablet (25 mg total) by mouth daily., Disp: 90 tablet, Rfl: 0   glucose blood (ACCU-CHEK GUIDE) test strip, Use as instructed, Disp: 100 each, Rfl: 12   metFORMIN (GLUCOPHAGE-XR) 750 MG 24 hr tablet, Take 1 tablet (750 mg total) by mouth in the morning and at bedtime., Disp: 180 tablet, Rfl: 1  Social History   Tobacco Use  Smoking Status Former   Current packs/day: 0.00   Types: Cigarettes   Quit date: 03/02/1995   Years since quitting:  27.6   Passive exposure: Past  Smokeless Tobacco Never    Allergies  Allergen Reactions   Lisinopril Swelling   Objective:  There were no vitals filed for this visit. There is no height or weight on file to calculate BMI. Constitutional Well developed. Well nourished.  Vascular Dorsalis pedis pulses palpable bilaterally. Posterior tibial pulses palpable bilaterally. Capillary refill normal to all digits.  No cyanosis or clubbing noted. Pedal hair growth normal.  Neurologic Normal speech. Oriented to person, place, and time. Epicritic sensation to light touch grossly present bilaterally.  Dermatologic Nails well groomed and normal in appearance. No open wounds. No skin lesions.  Orthopedic: Pain across the right medial foot pain along the course of the posterior tibial tendon no pain at the peroneal tendon at the Achilles tendon or ATFL ligament.  Pain with resisted inversion plantarflexion of the foot.   Radiographs: None Assessment:   1. Posterior tibial tendinitis of right leg     Plan:  Patient was evaluated and treated and all questions answered.  Right posterior tibial tendinitis with underlying pes planovalgus deformity -All questions and concerns were discussed with the patient in extensive detail.  -Patient can start slowly coming out of the cam boot and transition to regular shoes with an ankle brace.  She still has some residual pain therefore she will  benefit from steroid injection I discussed this with patient she states understanding like to proceed with steroid injection.  Also discussed the risk of rupture.  She states understanding -A steroid injection was performed at right medial foot using 1% plain Lidocaine and 10 mg of Kenalog. This was well tolerated.   No follow-ups on file.

## 2022-11-20 ENCOUNTER — Encounter: Payer: Self-pay | Admitting: Podiatry

## 2022-11-20 ENCOUNTER — Ambulatory Visit: Payer: Medicaid Other | Admitting: Podiatry

## 2022-11-20 VITALS — Ht 63.0 in | Wt 171.0 lb

## 2022-11-20 DIAGNOSIS — M76821 Posterior tibial tendinitis, right leg: Secondary | ICD-10-CM

## 2022-11-20 DIAGNOSIS — M7661 Achilles tendinitis, right leg: Secondary | ICD-10-CM

## 2022-11-20 NOTE — Progress Notes (Signed)
Subjective:  Patient ID: Colleen Ford, female    DOB: 02-Jul-1965,  MRN: [id number]  Chief Complaint  Patient presents with   Foot Pain    Posterior tibial tendinitis of right leg 4WK F/U    57 y.o. female presents with the above complaint.  Patient presents for follow-up of right posterior tibial tendinitis.  She states that injection helped considerably.  She is able to manage her status.  She has been wearing her ankle brace.  Review of Systems: Negative except as noted in the HPI. Denies N/V/F/Ch.  Past Medical History:  Diagnosis Date   Anemia    Arthritis    both knees   Depression    Diabetes mellitus without complication (HCC)    GERD (gastroesophageal reflux disease)    Hyperlipidemia    Hypertension    Pain of right thumb 04/10/2021   Rupture of UCL of right thumb 06/12/2021    Current Outpatient Medications:    Accu-Chek Softclix Lancets lancets, Use as instructed, Disp: 100 each, Rfl: 12   amLODipine (NORVASC) 10 MG tablet, TAKE 1 TABLET BY MOUTH AT BEDTIME, Disp: 90 tablet, Rfl: 1   atorvastatin (LIPITOR) 40 MG tablet, Take 1 tablet by mouth once daily, Disp: 90 tablet, Rfl: 3   Blood Glucose Monitoring Suppl (ACCU-CHEK GUIDE ME) w/Device KIT, USE AS DIRECTED TO  CHECK  FASTING  BLOOD  GLUCOSE  FIRST  THING  IN  THE  MORNING, Disp: 1 kit, Rfl: 0   Blood Pressure Monitoring (B-D ASSURE BPM/AUTO ARM CUFF) MISC, Take your blood pressure once a day, Disp: 1 each, Rfl: 0   empagliflozin (JARDIANCE) 25 MG TABS tablet, Take 1 tablet (25 mg total) by mouth daily., Disp: 90 tablet, Rfl: 0   glucose blood (ACCU-CHEK GUIDE) test strip, Use as instructed, Disp: 100 each, Rfl: 12   metFORMIN (GLUCOPHAGE-XR) 750 MG 24 hr tablet, Take 1 tablet (750 mg total) by mouth in the morning and at bedtime., Disp: 180 tablet, Rfl: 1  Social History   Tobacco Use  Smoking Status Former   Current packs/day: 0.00   Types: Cigarettes   Quit date: 03/02/1995   Years since quitting: 27.7    Passive exposure: Past  Smokeless Tobacco Never    Allergies  Allergen Reactions   Lisinopril Swelling   Objective:  There were no vitals filed for this visit. Body mass index is 30.29 kg/m. Constitutional Well developed. Well nourished.  Vascular Dorsalis pedis pulses palpable bilaterally. Posterior tibial pulses palpable bilaterally. Capillary refill normal to all digits.  No cyanosis or clubbing noted. Pedal hair growth normal.  Neurologic Normal speech. Oriented to person, place, and time. Epicritic sensation to light touch grossly present bilaterally.  Dermatologic Nails well groomed and normal in appearance. No open wounds. No skin lesions.  Orthopedic: No further pain across the right medial foot pain along the course of the posterior tibial tendon no pain at the peroneal tendon at the Achilles tendon or ATFL ligament.  No further pain with resisted inversion plantarflexion of the foot.   Radiographs: None Assessment:   No diagnosis found.   Plan:  Patient was evaluated and treated and all questions answered.  Right posterior tibial tendinitis with underlying pes planovalgus deformity -All questions and concerns were discussed with the patient in extensive detail.  -Clinically healed doing much better.  At this time I discussed prevention technique shoe gear modification if any foot and ankle issues or in the future she will come back and see me.  She is officially discharged from my care   No follow-ups on file.

## 2022-12-06 ENCOUNTER — Other Ambulatory Visit: Payer: Self-pay | Admitting: Family Medicine

## 2023-01-13 ENCOUNTER — Encounter: Payer: Self-pay | Admitting: Family Medicine

## 2023-01-15 ENCOUNTER — Other Ambulatory Visit: Payer: Self-pay | Admitting: Family Medicine

## 2023-01-15 DIAGNOSIS — E119 Type 2 diabetes mellitus without complications: Secondary | ICD-10-CM

## 2023-02-03 ENCOUNTER — Ambulatory Visit (INDEPENDENT_AMBULATORY_CARE_PROVIDER_SITE_OTHER): Payer: 59 | Admitting: Family Medicine

## 2023-02-03 ENCOUNTER — Encounter: Payer: Self-pay | Admitting: Family Medicine

## 2023-02-03 VITALS — BP 129/82 | HR 89 | Ht 63.0 in | Wt 169.4 lb

## 2023-02-03 DIAGNOSIS — I10 Essential (primary) hypertension: Secondary | ICD-10-CM

## 2023-02-03 DIAGNOSIS — E119 Type 2 diabetes mellitus without complications: Secondary | ICD-10-CM | POA: Diagnosis not present

## 2023-02-03 DIAGNOSIS — Z23 Encounter for immunization: Secondary | ICD-10-CM | POA: Diagnosis not present

## 2023-02-03 LAB — POCT GLYCOSYLATED HEMOGLOBIN (HGB A1C): HbA1c, POC (controlled diabetic range): 8.3 % — AB (ref 0.0–7.0)

## 2023-02-03 NOTE — Progress Notes (Signed)
    SUBJECTIVE:   CHIEF COMPLAINT / HPI:   Diabetes Knows her medications and taking regularly although did not bring in.  Trying to watch her diet and exercises some  Hypertension Taking amlodipine daily.  No lightheadness or edema   OBJECTIVE:   BP 129/82   Pulse 89   Ht 5\' 3"  (1.6 m)   Wt 169 lb 6.4 oz (76.8 kg)   LMP 07/08/2013   SpO2 97%   BMI 30.01 kg/m   Heart - Regular rate and rhythm.  No murmurs, gallops or rubs.    Lungs:  Normal respiratory effort, chest expands symmetrically. Lungs are clear to auscultation, no crackles or wheezes. Extremities:  No cyanosis, edema, or deformity noted with good range of motion of all major joints.     ASSESSMENT/PLAN:   Diabetes mellitus type 2 without retinopathy (HCC) Assessment & Plan: Worsening control.  Discussed starting a GLP1.  She would like to read about them and work on diet and exercise for 3 months   Orders: -     POCT glycosylated hemoglobin (Hb A1C)  Encounter for immunization -     Flu vaccine trivalent PF, 6mos and older(Flulaval,Afluria,Fluarix,Fluzone) -     Tdap vaccine greater than or equal to 7yo IM  Essential hypertension, benign Assessment & Plan: At goal.  Continue her current medications       Patient Instructions  Good to see you today - Thank you for coming in  Things we discussed today:  You need an diabetes eye exam every year.  Please see your eye doctor.  Ask them to fax [place] a report of your exam   Diabetes Keep taking the Jardiance and the metformin Consider - [email or web address] Work on diet and exercise   Come back in 3 months for an A1c  I have enjoyed working with you - Keep Well     Carney Living, MD [facility]

## 2023-02-03 NOTE — Assessment & Plan Note (Signed)
At goal.  Continue her current medications

## 2023-02-03 NOTE — Assessment & Plan Note (Signed)
Worsening control.  Discussed starting a GLP1.  She would like to read about them and work on diet and exercise for 3 months

## 2023-02-03 NOTE — Patient Instructions (Addendum)
Good to see you today - Thank you for coming in  Things we discussed today:  You need an diabetes eye exam every year.  Please see your eye doctor.  Ask them to fax [place] a report of your exam   Diabetes Keep taking the Jardiance and the metformin Consider - [email or web address] Work on diet and exercise   Come back in 3 months for an A1c  I have enjoyed working with you - Keep Well

## 2023-02-12 ENCOUNTER — Encounter: Payer: Self-pay | Admitting: Family Medicine

## 2023-02-13 MED ORDER — SEMAGLUTIDE(0.25 OR 0.5MG/DOS) 2 MG/3ML ~~LOC~~ SOPN: 0.2500 mg | PEN_INJECTOR | SUBCUTANEOUS | 1 refills | Status: DC

## 2023-02-14 ENCOUNTER — Telehealth: Payer: Self-pay

## 2023-02-14 NOTE — Telephone Encounter (Signed)
 Pharmacy Patient Advocate Encounter   Received notification from CoverMyMeds that prior authorization for OZEMPIC  is required/requested.   Insurance verification completed.   The patient is insured through [facility] .   PA required; PA submitted to above mentioned insurance via CoverMyMeds Key/confirmation #/EOC [id number]. Status is pending

## 2023-02-17 NOTE — Telephone Encounter (Signed)
 Pharmacy Patient Advocate Encounter  Received notification from [facility] that Prior Authorization for [facility] has been APPROVED from 02/15/23 to 02/15/24

## 2023-03-06 LAB — HM DIABETES EYE EXAM

## 2023-04-04 ENCOUNTER — Other Ambulatory Visit: Payer: Self-pay | Admitting: Family Medicine

## 2023-04-18 ENCOUNTER — Other Ambulatory Visit: Payer: Self-pay | Admitting: Family Medicine

## 2023-04-18 DIAGNOSIS — E119 Type 2 diabetes mellitus without complications: Secondary | ICD-10-CM

## 2023-05-06 ENCOUNTER — Other Ambulatory Visit: Payer: Self-pay

## 2023-05-06 DIAGNOSIS — E7800 Pure hypercholesterolemia, unspecified: Secondary | ICD-10-CM

## 2023-05-07 MED ORDER — SEMAGLUTIDE(0.25 OR 0.5MG/DOS) 2 MG/3ML ~~LOC~~ SOPN: 0.2500 mg | PEN_INJECTOR | SUBCUTANEOUS | 1 refills | Status: DC

## 2023-05-07 MED ORDER — AMLODIPINE BESYLATE 10 MG PO TABS: 10.0000 mg | ORAL_TABLET | Freq: Every day | ORAL | 0 refills | Status: DC

## 2023-05-07 MED ORDER — ATORVASTATIN CALCIUM 40 MG PO TABS: 40.0000 mg | ORAL_TABLET | Freq: Every day | ORAL | 3 refills | Status: DC

## 2023-05-10 ENCOUNTER — Other Ambulatory Visit: Payer: Self-pay | Admitting: Family Medicine

## 2023-05-18 ENCOUNTER — Other Ambulatory Visit: Payer: Self-pay | Admitting: Family Medicine

## 2023-05-19 ENCOUNTER — Encounter: Payer: Self-pay | Admitting: Student

## 2023-05-19 MED ORDER — OZEMPIC (0.25 OR 0.5 MG/DOSE) 2 MG/3ML ~~LOC~~ SOPN: 0.2500 mg | PEN_INJECTOR | SUBCUTANEOUS | 0 refills | Status: DC

## 2023-05-21 ENCOUNTER — Ambulatory Visit: Payer: Self-pay | Admitting: Student

## 2023-05-28 ENCOUNTER — Ambulatory Visit: Payer: Self-pay | Admitting: Student

## 2023-05-29 ENCOUNTER — Other Ambulatory Visit: Payer: Self-pay | Admitting: Family Medicine

## 2023-05-29 DIAGNOSIS — E119 Type 2 diabetes mellitus without complications: Secondary | ICD-10-CM

## 2023-05-30 ENCOUNTER — Ambulatory Visit: Admitting: Podiatry

## 2023-05-30 DIAGNOSIS — M76821 Posterior tibial tendinitis, right leg: Secondary | ICD-10-CM | POA: Diagnosis not present

## 2023-05-30 DIAGNOSIS — M21961 Unspecified acquired deformity of right lower leg: Secondary | ICD-10-CM

## 2023-05-30 DIAGNOSIS — M21962 Unspecified acquired deformity of left lower leg: Secondary | ICD-10-CM

## 2023-05-30 NOTE — Progress Notes (Signed)
 Subjective:  Patient ID: Colleen Ford, female    DOB: [birth year],  MRN: [id number]  Chief Complaint  Patient presents with   Foot Pain    58 y.o. female presents with the above complaint.  Patient presents for follow-up of right posterior tibial tendinitis.  She states that the injection helped considerably started coming back little but she would like to do another injection if possible.  Denies any other acute complaints  Review of Systems: Negative except as noted in the HPI. Denies N/V/F/Ch.  Past Medical History:  Diagnosis Date   Anemia    Arthritis    both knees   Depression    Diabetes mellitus without complication (HCC)    GERD (gastroesophageal reflux disease)    Hyperlipidemia    Hypertension    Pain of right thumb 04/10/2021   Rupture of UCL of right thumb 06/12/2021    Current Outpatient Medications:    ACCU-CHEK GUIDE TEST test strip, USE AS DIRECTED, Disp: 50 each, Rfl: 0   Accu-Chek Softclix Lancets lancets, Use as instructed, Disp: 100 each, Rfl: 12   amLODipine  (NORVASC ) 10 MG tablet, Take 1 tablet (10 mg total) by mouth at bedtime., Disp: 90 tablet, Rfl: 0   atorvastatin  (LIPITOR) 40 MG tablet, Take 1 tablet (40 mg total) by mouth daily., Disp: 90 tablet, Rfl: 3   Blood Glucose Monitoring Suppl (ACCU-CHEK GUIDE ME) w/Device KIT, USE AS DIRECTED TO  CHECK  FASTING  BLOOD  GLUCOSE  FIRST  THING  IN  THE  MORNING, Disp: 1 kit, Rfl: 0   JARDIANCE  25 MG TABS tablet, Take 1 tablet by mouth once daily, Disp: 90 tablet, Rfl: 0   metFORMIN  (GLUCOPHAGE -XR) 750 MG 24 hr tablet, TAKE 1 TABLET BY MOUTH IN THE MORNING AND AT BEDTIME, Disp: 180 tablet, Rfl: 2   Semaglutide ,0.25 or 0.5MG /DOS, (OZEMPIC , 0.25 OR 0.5 MG/DOSE,) 2 MG/3ML SOPN, Inject 0.25 mg into the skin once a week., Disp: 3 mL, Rfl: 0  Social History   Tobacco Use  Smoking Status Former   Current packs/day: 0.00   Types: Cigarettes   Quit date: 03/02/1995   Years since quitting: 28.2   Passive  exposure: Past  Smokeless Tobacco Never    Allergies  Allergen Reactions   Lisinopril  Swelling   Objective:  There were no vitals filed for this visit. There is no height or weight on file to calculate BMI. Constitutional Well developed. Well nourished.  Vascular Dorsalis pedis pulses palpable bilaterally. Posterior tibial pulses palpable bilaterally. Capillary refill normal to all digits.  No cyanosis or clubbing noted. Pedal hair growth normal.  Neurologic Normal speech. Oriented to person, place, and time. Epicritic sensation to light touch grossly present bilaterally.  Dermatologic Nails well groomed and normal in appearance. No open wounds. No skin lesions.  Orthopedic: Pain across the right medial foot pain along the course of the posterior tibial tendon no pain at the peroneal tendon at the Achilles tendon or ATFL ligament.  Pain with resisted inversion plantarflexion of the foot.   Radiographs: None Assessment:   1. Posterior tibial tendinitis of right leg   2. Deformity of both feet      Plan:  Patient was evaluated and treated and all questions answered.  Right posterior tibial tendinitis with underlying pes planovalgus deformity -All questions and concerns were discussed with the patient in extensive detail.  -Patient can start slowly coming out of the cam boot and transition to regular shoes with an ankle brace.  She still  has some residual pain therefore she will benefit from steroid injection I discussed this with patient she states understanding like to proceed with steroid injection.  Also discussed the risk of rupture.  She states understanding -A steroid injection was performed at right medial foot using 1% plain Lidocaine  and 10 mg of Kenalog . This was well tolerated.   No follow-ups on file.

## 2023-06-18 ENCOUNTER — Ambulatory Visit: Admitting: Student

## 2023-06-18 ENCOUNTER — Ambulatory Visit: Payer: Self-pay | Admitting: Student

## 2023-06-18 ENCOUNTER — Encounter: Payer: Self-pay | Admitting: Student

## 2023-06-18 VITALS — BP 132/84 | HR 74 | Ht 63.0 in | Wt 168.4 lb

## 2023-06-18 DIAGNOSIS — E7800 Pure hypercholesterolemia, unspecified: Secondary | ICD-10-CM | POA: Diagnosis not present

## 2023-06-18 DIAGNOSIS — I10 Essential (primary) hypertension: Secondary | ICD-10-CM

## 2023-06-18 DIAGNOSIS — E119 Type 2 diabetes mellitus without complications: Secondary | ICD-10-CM

## 2023-06-18 LAB — POCT GLYCOSYLATED HEMOGLOBIN (HGB A1C): HbA1c, POC (controlled diabetic range): 7 % (ref 0.0–7.0)

## 2023-06-18 MED ORDER — SEMAGLUTIDE(0.25 OR 0.5MG/DOS) 2 MG/3ML ~~LOC~~ SOPN
0.5000 mg | PEN_INJECTOR | SUBCUTANEOUS | 1 refills | Status: DC
Start: 2023-06-18 — End: 2023-09-17

## 2023-06-18 NOTE — Patient Instructions (Signed)
 It was great to see you! Thank you for allowing me to participate in your care!   I recommend that you always bring your medications to each appointment as this makes it easy to ensure we are on the correct medications and helps us  not miss when refills are needed.  Our plans for today:  - Please take 0.5 mg Ozempic  weekly - Follow-up in 6-8 weeks for annual physical, we will check labs then   Take care and seek immediate care sooner if you develop any concerns. Please remember to show up 15 minutes before your scheduled appointment time!  Lavada Porteous, DO [facility]

## 2023-06-18 NOTE — Assessment & Plan Note (Signed)
 A1c of 7.0 today, significant improved since starting Ozempic .  Will increase to 0.5 mg for added weight loss benefit and control. - Ozempic  0.5 mg weekly - Continue metformin  750 mg daily and Jardiance  25 mg daily -A1c check in 3 months

## 2023-06-18 NOTE — Progress Notes (Signed)
    SUBJECTIVE:   CHIEF COMPLAINT / HPI:   T2DM Compliant with Ozempic , 0.25 mg weekly. Denies side-effects. Taking metformin  750 mg once daily. Jardiance  25 mg daily. Intermittently missing medications.  HTN Amlodipine  10 mg, no side-effects, BP well controlled.  No chest pain, no headaches, no dizziness, no palpitations.  HLD Taking 40 mg atorvastatin , last LDL within goal.  OBJECTIVE:   BP 132/84   Pulse 74   Ht 5\' 3"  (1.6 m)   Wt 168 lb 6.4 oz (76.4 kg)   LMP 07/08/2013   SpO2 100%   BMI 29.83 kg/m    General: NAD, pleasant Cardio: RRR, no MRG. Respiratory: CTAB, normal wob on RA Skin: Warm and dry  ASSESSMENT/PLAN:   Assessment & Plan Diabetes mellitus type 2 without retinopathy (HCC) A1c of 7.0 today, significant improved since starting Ozempic .  Will increase to 0.5 mg for added weight loss benefit and control. - Ozempic  0.5 mg weekly - Continue metformin  750 mg daily and Jardiance  25 mg daily -A1c check in 3 months Essential hypertension, benign Well-controlled today. - Continue amlodipine  10 mg daily Pure hypercholesterolemia Continue 40 mg atorvastatin  daily   Follow-up recommendations Needs annual exam, patient will schedule. Review Prevnar 20 needs  Lavada Porteous, DO [facility]

## 2023-06-18 NOTE — Assessment & Plan Note (Signed)
 Well controlled today. Continue amlodipine 10mg  daily.

## 2023-06-18 NOTE — Assessment & Plan Note (Signed)
 Continue 40 mg atorvastatin  daily

## 2023-07-04 ENCOUNTER — Other Ambulatory Visit: Payer: Self-pay | Admitting: Family Medicine

## 2023-07-04 ENCOUNTER — Other Ambulatory Visit: Payer: Self-pay | Admitting: Student

## 2023-07-04 DIAGNOSIS — E7800 Pure hypercholesterolemia, unspecified: Secondary | ICD-10-CM

## 2023-07-14 ENCOUNTER — Other Ambulatory Visit: Payer: Self-pay | Admitting: Student

## 2023-07-14 DIAGNOSIS — E119 Type 2 diabetes mellitus without complications: Secondary | ICD-10-CM

## 2023-07-22 ENCOUNTER — Telehealth: Payer: Self-pay

## 2023-07-22 NOTE — Telephone Encounter (Signed)
 [facility] LVM on nurse line requesting an updated prescription on testing strips.  They are requesting number of times testing per day.   I called patient to clarify, however no answer. VM left asking her to return my call.   Will send in new script once I have testing information.

## 2023-07-28 ENCOUNTER — Other Ambulatory Visit (INDEPENDENT_AMBULATORY_CARE_PROVIDER_SITE_OTHER): Payer: Self-pay

## 2023-07-28 ENCOUNTER — Ambulatory Visit: Admitting: Orthopedic Surgery

## 2023-07-28 DIAGNOSIS — M79644 Pain in right finger(s): Secondary | ICD-10-CM

## 2023-07-28 DIAGNOSIS — M1811 Unilateral primary osteoarthritis of first carpometacarpal joint, right hand: Secondary | ICD-10-CM

## 2023-07-28 NOTE — Progress Notes (Signed)
 Colleen Ford - 58 y.o. female MRN [id number]  Date of birth: October 24, 1965  Office Visit Note: Visit Date: 07/28/2023 PCP: Howell Lunger, DO Referred by: Howell Lunger, DO  Subjective: No chief complaint on file.  HPI: Colleen Ford is a pleasant 58 y.o. female who presents today for evaluation of right thumb basilar joint pain with associated IP joint pain that has been present now for multiple months, worsening in nature.  She has a history notable of right thumb UCL repair performed in 2013 by Dr. Romona.  She has not undergone any formalized treatment for the right thumb basal joint pain or the IP joint pain previously.  She is overall healthy and active at baseline.  She is diabetic, recent A1c is 7.  Pertinent ROS were reviewed with the patient and found to be negative unless otherwise specified above in HPI.   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 Arthritis of carpometacarpal (CMC) joint of right thumb   2. Pain of right thumb     Plan: Extensive discussion was had with the patient today regarding her right thumb basilar joint pain.  X-rays today confirm diagnosis of ongoing right thumb CMC arthritis which correlates with her clinical examination.  We reviewed the etiology and pathophysiology of this condition as well as appropriate treatment modalities ranging from conservative to surgical.  From a conservative standpoint, we discussed bracing, activity modification, nonsteroidal anti-inflammatory medications both oral and topical, and finally cortisone injections.  From  surgical standpoint, we discussed the possibility for right thumb CMC arthroplasty in the future should symptoms refractory to conservative care.  I discussed the surgical treatment as well as the postop protocol in detail with her as well today for her understanding.  At this juncture, given that she has not undergone any formalized treatment, we will have her fitted for a thumb spica brace for symptom relief.  I did explain to her the underlying IP joint arthritis as well at the thumb which is associated mostly with thumb pinch, the thumb spica brace will help incorporate some stability in this region as well.  She is welcome to follow-up with me on an as-needed basis in the near future for repeat evaluation and possible injection to the right thumb CMC interval for symptom relief.  I spent 35 minutes in the care of this patient today including review of previous documentation, imaging obtained, face-to-face time discussing all options regarding treatment and documenting the encounter.   Follow-up: No follow-ups on file.   Meds & Orders: No orders of the defined types were placed in this encounter.   Orders Placed This Encounter  Procedures   XR Finger Thumb Right   XR Wrist Complete Right     Procedures: No procedures performed      Clinical History: No specialty comments available.  She reports that she quit smoking about 28 years ago. Her smoking use included cigarettes. She has been exposed to tobacco smoke. She has never used smokeless tobacco.  Recent Labs    02/03/23 1417 06/18/23 0950  HGBA1C 8.3* 7.0    Objective:   Vital Signs: LMP 07/08/2013   Physical Exam  Gen: Well-appearing, in no acute distress; non-toxic  CV: Regular Rate. Well-perfused. Warm.  Resp: Breathing unlabored on room air; no wheezing. Psych: Fluid speech in conversation; appropriate affect; normal thought process  Ortho Exam General: Patient is well appearing and in no distress.    Skin and  Muscle: No skin changes are apparent to upper extremities.  Muscle bulk and contour normal, no signs of atrophy.      Range of Motion and Palpation Tests: Mobility is full about the elbows with flexion and extension.  Forearm supination and pronation are 85/85 bilaterally.  Wrist flexion/extension is 75/65 bilaterally.  Digital flexion and extension are full.  Thumb opposition is full to the base of the small fingers bilaterally.     No cords or nodules are palpated.  No triggering is observed.     Significant tenderness over the right thumb CMC articulation is observed, positive grind for pain, positive crepitus.  MP hyperextension negative.    Finklestein test negative   Neurologic, Vascular, Motor: Sensation is intact to light touch in the median/radial/ulnar distributions.  Tinel's testing negative at wrist level. Fingers pink and well perfused.  Capillary refill is brisk.     Imaging: XR Finger Thumb Right Result Date: 07/28/2023 X-rays of the right thumb demonstrate asymmetric joint space narrowing at the IP joint pronounced mostly on the radial border with underlying osteophyte formation.  Bone mineralization is appropriate.  XR Wrist Complete Right Result Date: 07/28/2023 X-rays of the right wrist demonstrate degenerative changes at the thumb [facility] interval with joint space narrowing, osteophyte formation and subchondral sclerosis.  Prior hardware from the thumb UCL fixation is visualized.   Past Medical/Family/Surgical/Social History: Medications & Allergies reviewed per EMR, new medications updated. Patient Active Problem List   Diagnosis Date Noted   ASCUS of cervix with negative high risk HPV 01/04/2022   Rupture of UCL of right thumb 06/12/2021   Hyperlipidemia 07/07/2014   Angioedema of lips 06/04/2013   Overweight and obesity(278.0) 06/30/2012   Diabetes mellitus type 2 without retinopathy (HCC) 11/10/2009   Bilateral chronic knee pain 11/10/2009   Essential  hypertension, benign 10/06/2009   Past Medical History:  Diagnosis Date   Anemia    Arthritis    both knees   Depression    Diabetes mellitus without complication (HCC)    GERD (gastroesophageal reflux disease)    Hyperlipidemia    Hypertension    Pain of right thumb 04/10/2021   Rupture of UCL of right thumb 06/12/2021   Family History  Problem Relation Age of Onset   Diabetes Mother    Hypertension Mother    Diabetes Father    Hypertension Father    Lung cancer Father    Hypertension Sister    Heart attack Maternal Aunt    Heart Problems Cousin    Colon cancer Neg Hx    Colon polyps Neg Hx    Esophageal cancer Neg Hx    Rectal cancer Neg Hx    Stomach cancer Neg Hx    Breast cancer Neg Hx    Past Surgical History:  Procedure Laterality Date   ANKLE SURGERY     2022   HERNIA REPAIR     hernia surgery as a child   TUBAL LIGATION     ULNAR COLLATERAL LIGAMENT REPAIR Right 07/11/2021   Procedure: RIGHT THUMB ULNAR COLLATERAL LIGAMENT REPAIR;  Surgeon: Romona Harari, MD;  Location: [facility];  Service: Orthopedics;  Laterality: Right;   Social History   Occupational History   Not on file  Tobacco Use   Smoking status: Former    Current packs/day: 0.00    Types: Cigarettes    Quit date: 03/02/1995    Years since quitting: 28.4    Passive exposure: Past   Smokeless tobacco: Never  Vaping Use   Vaping status: Never Used  Substance and Sexual Activity   Alcohol use: Yes    Comment: rarely   Drug use: Never   Sexual activity: Yes    Birth control/protection: Surgical    Comment: tubal ligation    Lailah Marcelli Afton Alderton, M.D. [facility], Hand Surgery

## 2023-08-02 ENCOUNTER — Other Ambulatory Visit: Payer: Self-pay | Admitting: Student

## 2023-08-02 DIAGNOSIS — E119 Type 2 diabetes mellitus without complications: Secondary | ICD-10-CM

## 2023-08-19 ENCOUNTER — Other Ambulatory Visit: Payer: Self-pay | Admitting: Student

## 2023-08-19 DIAGNOSIS — E119 Type 2 diabetes mellitus without complications: Secondary | ICD-10-CM

## 2023-08-26 NOTE — Telephone Encounter (Signed)
 Received fax from [facility] about needing a Sig of instructions for Acc-Chek Guide in Vitro Strip. Is just says Use As Directed. They are wanting instructions on use. Nelson Land, CMA

## 2023-08-27 MED ORDER — ACCU-CHEK GUIDE TEST VI STRP: 1.0000 | ORAL_STRIP | 0 refills | Status: DC

## 2023-08-27 NOTE — Addendum Note (Signed)
 Addended by: HOWELL LUNGER on: 08/27/2023 07:12 AM   Modules accepted: Orders

## 2023-09-12 ENCOUNTER — Ambulatory Visit (INDEPENDENT_AMBULATORY_CARE_PROVIDER_SITE_OTHER): Admitting: Podiatry

## 2023-09-12 DIAGNOSIS — M76821 Posterior tibial tendinitis, right leg: Secondary | ICD-10-CM

## 2023-09-12 DIAGNOSIS — M7661 Achilles tendinitis, right leg: Secondary | ICD-10-CM

## 2023-09-12 NOTE — Progress Notes (Signed)
 Subjective:  Patient ID: Colleen Ford, female    DOB: October 04, 1965,  MRN: [id number]  Chief Complaint  Patient presents with   Posterior tibial tendinitis of right leg    Posterior tibial tendinitis of right leg pt stated that it still gives her some trouble    58 y.o. female presents with the above complaint.  Patient presents for follow-up of right posterior tibial tendinitis.  She states that the injection helped considerably started coming back little but she would like to do another injection if possible.  Denies any other acute complaints  Review of Systems: Negative except as noted in the HPI. Denies N/V/F/Ch.  Past Medical History:  Diagnosis Date   Anemia    Arthritis    both knees   Depression    Diabetes mellitus without complication (HCC)    GERD (gastroesophageal reflux disease)    Hyperlipidemia    Hypertension    Pain of right thumb 04/10/2021   Rupture of UCL of right thumb 06/12/2021    Current Outpatient Medications:    Accu-Chek Softclix Lancets lancets, Use as instructed, Disp: 100 each, Rfl: 12   amLODipine  (NORVASC ) 10 MG tablet, TAKE 1 TABLET BY MOUTH AT BEDTIME, Disp: 90 tablet, Rfl: 0   atorvastatin  (LIPITOR) 40 MG tablet, Take 1 tablet by mouth once daily, Disp: 90 tablet, Rfl: 0   Blood Glucose Monitoring Suppl (ACCU-CHEK GUIDE ME) w/Device KIT, USE AS DIRECTED TO  CHECK  FASTING  BLOOD  GLUCOSE  FIRST  THING  IN  THE  MORNING, Disp: 1 kit, Rfl: 0   glucose blood (ACCU-CHEK GUIDE TEST) test strip, 1 each by Other route See admin instructions. Use 1 strip when you check your blood sugar., Disp: 50 each, Rfl: 0   JARDIANCE  25 MG TABS tablet, Take 1 tablet by mouth once daily, Disp: 90 tablet, Rfl: 0   metFORMIN  (GLUCOPHAGE -XR) 750 MG 24 hr tablet, TAKE 1 TABLET BY MOUTH IN THE MORNING AND AT BEDTIME, Disp: 180 tablet, Rfl: 2   OZEMPIC , 0.25 OR 0.5 MG/DOSE, 2 MG/3ML SOPN, INJECT 0.5MG  INTO THE SKIN ONCE WEEKLY, Disp: 3 mL, Rfl: 0  Social History    Tobacco Use  Smoking Status Former   Current packs/day: 0.00   Types: Cigarettes   Quit date: 03/02/1995   Years since quitting: 28.5   Passive exposure: Past  Smokeless Tobacco Never    Allergies  Allergen Reactions   Lisinopril  Swelling   Objective:  There were no vitals filed for this visit. There is no height or weight on file to calculate BMI. Constitutional Well developed. Well nourished.  Vascular Dorsalis pedis pulses palpable bilaterally. Posterior tibial pulses palpable bilaterally. Capillary refill normal to all digits.  No cyanosis or clubbing noted. Pedal hair growth normal.  Neurologic Normal speech. Oriented to person, place, and time. Epicritic sensation to light touch grossly present bilaterally.  Dermatologic Nails well groomed and normal in appearance. No open wounds. No skin lesions.  Orthopedic: Pain across the right medial foot pain along the course of the posterior tibial tendon no pain at the peroneal tendon at the Achilles tendon or ATFL ligament.  Pain with resisted inversion plantarflexion of the foot.   Radiographs: None Assessment:   1. Posterior tibial tendinitis of right leg      Plan:  Patient was evaluated and treated and all questions answered.  Right posterior tibial tendinitis with underlying pes planovalgus deformity -All questions and concerns were discussed with the patient in extensive detail.  -Patient  can start slowly coming out of the cam boot and transition to regular shoes with an ankle brace.  She still has some residual pain therefore she will benefit from steroid injection I discussed this with patient she states understanding like to proceed with steroid injection.  Also discussed the risk of rupture.  She states understanding -Clinically the injection have not helped.  Given that she still hurts she would benefit from an MRI evaluation MRI was sent to the pharmacy - She will come back and play with the MRI.   No  follow-ups on file.

## 2023-09-17 ENCOUNTER — Other Ambulatory Visit: Payer: Self-pay | Admitting: Student

## 2023-09-17 DIAGNOSIS — E119 Type 2 diabetes mellitus without complications: Secondary | ICD-10-CM

## 2023-09-24 ENCOUNTER — Telehealth: Payer: Self-pay | Admitting: Podiatry

## 2023-09-24 DIAGNOSIS — Z0271 Encounter for disability determination: Secondary | ICD-10-CM

## 2023-09-24 NOTE — Telephone Encounter (Signed)
 called and s/w Adrien and she gave email to send forms since the fax number is not allowing to go thru. [email or web address]

## 2023-09-24 NOTE — Telephone Encounter (Signed)
 Forms were recd for pt's leave. Faxed [phone] form/notes.

## 2023-09-29 ENCOUNTER — Telehealth: Payer: Self-pay

## 2023-09-29 NOTE — Telephone Encounter (Addendum)
 Insurance is requesting documentation of 4 weeks worth of treatment within the past 6 months. Also they say that recent exam showing a problem with the joints (positive orthopedic sign) and a recent xray.

## 2023-10-01 ENCOUNTER — Encounter: Payer: Self-pay | Admitting: Podiatry

## 2023-10-03 ENCOUNTER — Other Ambulatory Visit

## 2023-11-18 ENCOUNTER — Other Ambulatory Visit: Payer: Self-pay | Admitting: Student

## 2023-11-18 ENCOUNTER — Other Ambulatory Visit: Payer: Self-pay | Admitting: Family Medicine

## 2023-11-18 DIAGNOSIS — E119 Type 2 diabetes mellitus without complications: Secondary | ICD-10-CM

## 2023-11-20 ENCOUNTER — Other Ambulatory Visit (HOSPITAL_COMMUNITY): Payer: Self-pay

## 2023-11-20 ENCOUNTER — Telehealth: Payer: Self-pay

## 2023-11-20 NOTE — Telephone Encounter (Signed)
 PA rec'd for Jardiance  medication.   Awaiting clinical questions.

## 2023-11-24 NOTE — Telephone Encounter (Signed)
 Prior authorization submitted for JARDIANCE  to [facility] MEDICAID via Latent.   Key: [id number]

## 2023-11-25 NOTE — Telephone Encounter (Signed)
 Pharmacy Patient Advocate Encounter  Received notification from [facility] MEDICAID that Prior Authorization for JARDIANCE  has been APPROVED from 11/24/23 to 11/23/24   PA #/Case ID/Reference #: [id number]

## 2023-12-08 ENCOUNTER — Other Ambulatory Visit: Payer: Self-pay | Admitting: Student

## 2023-12-08 ENCOUNTER — Encounter: Payer: Self-pay | Admitting: Radiology

## 2023-12-08 DIAGNOSIS — E119 Type 2 diabetes mellitus without complications: Secondary | ICD-10-CM

## 2023-12-15 ENCOUNTER — Telehealth: Payer: Self-pay

## 2023-12-15 ENCOUNTER — Other Ambulatory Visit (HOSPITAL_COMMUNITY): Payer: Self-pay

## 2023-12-15 NOTE — Telephone Encounter (Signed)
 Pharmacy Patient Advocate Encounter   PA required; PA submitted to above mentioned insurance via [employer] Key/confirmation #/EOC [id number]. Status is pending

## 2023-12-15 NOTE — Telephone Encounter (Signed)
 Pharmacy Patient Advocate Encounter   Received notification from CoverMyMeds that prior authorization for OZEMPIC  0.5MG  is required/requested.   Insurance verification completed.   The patient is insured through [facility] MEDICAID.   PA required; PA started via CoverMyMeds. KEY [id number] . Waiting for clinical questions to populate.

## 2023-12-15 NOTE — Telephone Encounter (Signed)
 Pharmacy Patient Advocate Encounter  Received notification from [facility] MEDICAID that Prior Authorization for OZEMPIC  0.5MG  has been APPROVED from 12/15/23 to 12/14/24   PA #/Case ID/Reference #: [id number]

## 2024-01-09 ENCOUNTER — Other Ambulatory Visit: Payer: Self-pay | Admitting: Student

## 2024-01-17 ENCOUNTER — Other Ambulatory Visit: Payer: Self-pay | Admitting: Student

## 2024-01-17 DIAGNOSIS — E119 Type 2 diabetes mellitus without complications: Secondary | ICD-10-CM

## 2024-01-25 ENCOUNTER — Other Ambulatory Visit: Payer: Self-pay | Admitting: Student

## 2024-01-25 DIAGNOSIS — E119 Type 2 diabetes mellitus without complications: Secondary | ICD-10-CM

## 2024-02-09 ENCOUNTER — Ambulatory Visit: Payer: Self-pay | Admitting: Student

## 2024-02-11 ENCOUNTER — Other Ambulatory Visit: Payer: Self-pay | Admitting: Student

## 2024-02-11 DIAGNOSIS — E119 Type 2 diabetes mellitus without complications: Secondary | ICD-10-CM

## 2024-02-13 ENCOUNTER — Telehealth: Payer: Self-pay

## 2024-02-13 ENCOUNTER — Encounter: Payer: Self-pay | Admitting: Student

## 2024-02-13 ENCOUNTER — Ambulatory Visit: Admitting: Student

## 2024-02-13 ENCOUNTER — Other Ambulatory Visit (HOSPITAL_COMMUNITY): Payer: Self-pay

## 2024-02-13 VITALS — BP 129/84 | HR 75 | Ht 63.0 in | Wt 170.5 lb

## 2024-02-13 DIAGNOSIS — E119 Type 2 diabetes mellitus without complications: Secondary | ICD-10-CM

## 2024-02-13 DIAGNOSIS — Z23 Encounter for immunization: Secondary | ICD-10-CM | POA: Diagnosis not present

## 2024-02-13 DIAGNOSIS — Z0000 Encounter for general adult medical examination without abnormal findings: Secondary | ICD-10-CM | POA: Diagnosis present

## 2024-02-13 DIAGNOSIS — Z1159 Encounter for screening for other viral diseases: Secondary | ICD-10-CM | POA: Diagnosis not present

## 2024-02-13 LAB — POCT GLYCOSYLATED HEMOGLOBIN (HGB A1C): HbA1c, POC (controlled diabetic range): 8.8 % — AB (ref 0.0–7.0)

## 2024-02-13 NOTE — Patient Instructions (Signed)
 It was great to see you! Thank you for allowing me to participate in your care!   I recommend that you always bring your medications to each appointment as this makes it easy to ensure we are on the correct medications and helps us  not miss when refills are needed.  Our plans for today:  - Please take Ozempic  0.25 mg weekly for 4 weeks, then increase to 0.5 mg weekly for 4 weeks -Follow-up in 2 weeks for your shoulder -Follow-up in 3 months for diabetes recheck   Take care and seek immediate care sooner if you develop any concerns. Please remember to show up 15 minutes before your scheduled appointment time!  Gladis Church, DO [facility]

## 2024-02-13 NOTE — Progress Notes (Signed)
" ° ° °  SUBJECTIVE:   Chief compliant/HPI: annual examination  Colleen Ford is a 59 y.o. who presents today for an annual exam.     History tabs reviewed and updated.   T2DM Reports without Ozempic  for 2 months.  Due to insurance issue.  OBJECTIVE:   BP 129/84   Pulse 75   Ht 5' 3 (1.6 m)   Wt 170 lb 8 oz (77.3 kg)   LMP 07/08/2013   SpO2 100%   BMI 30.20 kg/m    General: NAD, pleasant Cardio: RRR, no MRG.  Respiratory: CTAB, normal wob on RA Skin: Warm and dry  ASSESSMENT/PLAN:   Assessment & Plan Annual physical exam PHQ score  reviewed and discussed.  BP reviewed and at goal  Considered the following items based upon USPSTF recommendations: Diabetes screening: Diabetic, A1c 8.8 today HIV testing: Not ordered Hepatitis C: Ordered Hepatitis B: Ordered Syphilis if at high risk: Not high risk GC/CT not high risk Lipid panel (nonfasting or fasting) discussed based upon AHA recommendations and not ordered.  Consider repeat every 4-6 years.  Osteoporosis screening not discussed today  Cancer Screening Discussion  Cervical cancer screening: prior Pap reviewed, repeat due in Nov 2026 Breast cancer screening: recently completed and repeat not yet indicated Lung cancer screening:Not indicated.  Colorectal cancer screening: up to date on screening for CRC..   Follow up in 1 year or sooner if indicated.  MyChart Activation: Already signed up Diabetes mellitus type 2 without retinopathy (HCC) -A1c 8.8 today - Ozempic  nonadherence 2/2 insurance - Refilled Ozempic ,  continue metformin  - UACR today - Follow-up in 3 months  Gladis Church, DO [facility]  "

## 2024-02-13 NOTE — Telephone Encounter (Signed)
 A user error has taken place: encounter opened in error, closed for administrative reasons.

## 2024-02-13 NOTE — Assessment & Plan Note (Signed)
-  A1c 8.8 today - Ozempic  nonadherence 2/2 insurance - Refilled Ozempic ,  continue metformin  - UACR today - Follow-up in 3 months

## 2024-02-14 ENCOUNTER — Ambulatory Visit: Payer: Self-pay | Admitting: Student

## 2024-02-14 LAB — HEPATITIS B CORE ANTIBODY, TOTAL: Hep B Core Total Ab: POSITIVE — AB

## 2024-02-14 LAB — BASIC METABOLIC PANEL WITH GFR
BUN/Creatinine Ratio: 18 (ref 9–23)
BUN: 14 mg/dL (ref 6–24)
CO2: 25 mmol/L (ref 20–29)
Calcium: 9.6 mg/dL (ref 8.7–10.2)
Chloride: 100 mmol/L (ref 96–106)
Creatinine, Ser: 0.8 mg/dL (ref 0.57–1.00)
Glucose: 94 mg/dL (ref 70–99)
Potassium: 3.9 mmol/L (ref 3.5–5.2)
Sodium: 142 mmol/L (ref 134–144)
eGFR: 86 mL/min/1.73

## 2024-02-14 LAB — HEPATITIS B SURFACE ANTIGEN: Hepatitis B Surface Ag: NEGATIVE

## 2024-02-14 LAB — MICROALBUMIN / CREATININE URINE RATIO
Creatinine, Urine: 179.3 mg/dL
Microalb/Creat Ratio: 13 mg/g{creat} (ref 0–29)
Microalbumin, Urine: 23.5 ug/mL

## 2024-02-14 LAB — HEPATITIS B SURFACE ANTIBODY, QUANTITATIVE: Hepatitis B Surf Ab Quant: 309 m[IU]/mL

## 2024-02-23 ENCOUNTER — Other Ambulatory Visit (HOSPITAL_COMMUNITY): Payer: Self-pay

## 2024-03-05 ENCOUNTER — Encounter: Payer: Self-pay | Admitting: Student

## 2024-03-05 ENCOUNTER — Ambulatory Visit: Payer: Self-pay | Admitting: Student

## 2024-03-05 VITALS — BP 127/82 | HR 78 | Ht 63.0 in | Wt 173.6 lb

## 2024-03-05 DIAGNOSIS — G5601 Carpal tunnel syndrome, right upper limb: Secondary | ICD-10-CM | POA: Diagnosis not present

## 2024-03-05 DIAGNOSIS — G8929 Other chronic pain: Secondary | ICD-10-CM | POA: Diagnosis not present

## 2024-03-05 DIAGNOSIS — M25511 Pain in right shoulder: Secondary | ICD-10-CM

## 2024-03-05 NOTE — Progress Notes (Signed)
" ° ° °  SUBJECTIVE:   CHIEF COMPLAINT / HPI:   Acute on chronic right shoulder pain Chronic right shoulder pain.  Acutely exacerbated for the past 2 weeks.  Worse while working, worse after waking up.  Located over anterior/lateral shoulder.  Right hand numbness Reports prior history of carpal tunnel.  Does not use bracing at night.  Pain primarily when waking up from sleep.  Numbness and tingling in digits 1 through 4 on right hand.  OBJECTIVE:   BP 127/82   Pulse 78   Ht 5' 3 (1.6 m)   Wt 173 lb 9.6 oz (78.7 kg)   LMP 07/08/2013   SpO2 98%   BMI 30.75 kg/m    General: NAD, pleasant Respiratory: normal wob on RA Skin: Warm and dry Right shoulder: No gross deformity, no ecchymosis, no swelling.  Full range of motion.  Mild TTP over SA joint.  5/5 strength.  Normal sensation. Hawkins positive Empty can positive Adduction test positive O'Brien's negative Yergason negative Right hand: Squaring of CMC joint, firm nodule located DIP of thumb, no ecchymosis, no swelling.  Full range of motion.  5/5 strength.  Normal sensation.  Tinel's test positive.  ASSESSMENT/PLAN:   Assessment & Plan Chronic right shoulder pain Differential: Rotator cuff impingement, osteoarthritis. - Will obtain x-ray - Follow-up in 2 weeks for likely SA joint injection - If symptoms worsen or fail to improve consider referral to orthopedics Carpal tunnel syndrome of right wrist -Supportive care measures - Nightly bracing - OTC measures discussed - If symptoms worsen or fail to improve, consider referral to orthopedics  Gladis Church, DO [facility]  "

## 2024-03-05 NOTE — Patient Instructions (Addendum)
 It was great to see you! Thank you for allowing me to participate in your care!   I recommend that you always bring your medications to each appointment as this makes it easy to ensure we are on the correct medications and helps us  not miss when refills are needed.  Our plans for today:   - I recommend buy a wrist brace to wear while sleeping to help with carpal tunnel. See image below. - You will receive a call for physical therapy - Follow-up with me in 2 weeks An x-ray was ordered for you---you do not need an appointment to have this completed.  I recommend going to [facility] [address] [place]  If the results are normal,I will send you a letter  I will call you with results if anything is abnormal         Take care and seek immediate care sooner if you develop any concerns. Please remember to show up 15 minutes before your scheduled appointment time!  Gladis Church, DO [facility]

## 2024-03-10 ENCOUNTER — Ambulatory Visit: Admitting: Podiatry

## 2024-03-19 ENCOUNTER — Ambulatory Visit: Admitting: Podiatry

## 2024-03-19 ENCOUNTER — Ambulatory Visit

## 2024-03-22 ENCOUNTER — Ambulatory Visit: Payer: Self-pay | Admitting: Student
